# Patient Record
Sex: Male | Born: 1960 | Race: White | Hispanic: No | Smoking: Current every day smoker
Health system: Southern US, Community
[De-identification: ages and names within clinical notes are randomized; demographics above are authoritative.]

## PROBLEM LIST (undated history)

## (undated) DIAGNOSIS — Z789 Other specified health status: Secondary | ICD-10-CM

---
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
## 2020-09-21 ENCOUNTER — Emergency Department (HOSPITAL_COMMUNITY): Payer: Self-pay

## 2020-09-21 ENCOUNTER — Inpatient Hospital Stay (HOSPITAL_COMMUNITY)
Admission: EM | Admit: 2020-09-21 | Discharge: 2020-10-12 | DRG: 515 | Disposition: A | Discharge status: Home / Self Care | Payer: Self-pay | Attending: Internal Medicine | Admitting: Internal Medicine

## 2020-09-21 ENCOUNTER — Encounter (HOSPITAL_COMMUNITY): Payer: Self-pay | Admitting: Emergency Medicine

## 2020-09-21 ENCOUNTER — Other Ambulatory Visit: Payer: Self-pay

## 2020-09-21 DIAGNOSIS — C787 Secondary malignant neoplasm of liver and intrahepatic bile duct: Secondary | ICD-10-CM | POA: Diagnosis present

## 2020-09-21 DIAGNOSIS — Z5902 Unsheltered homelessness: Secondary | ICD-10-CM

## 2020-09-21 DIAGNOSIS — G893 Neoplasm related pain (acute) (chronic): Secondary | ICD-10-CM | POA: Diagnosis present

## 2020-09-21 DIAGNOSIS — E875 Hyperkalemia: Secondary | ICD-10-CM | POA: Diagnosis not present

## 2020-09-21 DIAGNOSIS — D62 Acute posthemorrhagic anemia: Secondary | ICD-10-CM | POA: Diagnosis not present

## 2020-09-21 DIAGNOSIS — C786 Secondary malignant neoplasm of retroperitoneum and peritoneum: Secondary | ICD-10-CM | POA: Diagnosis present

## 2020-09-21 DIAGNOSIS — I639 Cerebral infarction, unspecified: Secondary | ICD-10-CM | POA: Diagnosis present

## 2020-09-21 DIAGNOSIS — I700 Atherosclerosis of aorta: Secondary | ICD-10-CM | POA: Diagnosis present

## 2020-09-21 DIAGNOSIS — C772 Secondary and unspecified malignant neoplasm of intra-abdominal lymph nodes: Secondary | ICD-10-CM | POA: Diagnosis present

## 2020-09-21 DIAGNOSIS — Z7189 Other specified counseling: Secondary | ICD-10-CM

## 2020-09-21 DIAGNOSIS — G589 Mononeuropathy, unspecified: Secondary | ICD-10-CM | POA: Diagnosis present

## 2020-09-21 DIAGNOSIS — Z66 Do not resuscitate: Secondary | ICD-10-CM | POA: Diagnosis not present

## 2020-09-21 DIAGNOSIS — C7989 Secondary malignant neoplasm of other specified sites: Secondary | ICD-10-CM | POA: Diagnosis present

## 2020-09-21 DIAGNOSIS — N1831 Chronic kidney disease, stage 3a: Secondary | ICD-10-CM | POA: Diagnosis not present

## 2020-09-21 DIAGNOSIS — Z5320 Procedure and treatment not carried out because of patient's decision for unspecified reasons: Secondary | ICD-10-CM | POA: Diagnosis not present

## 2020-09-21 DIAGNOSIS — Z515 Encounter for palliative care: Secondary | ICD-10-CM

## 2020-09-21 DIAGNOSIS — I129 Hypertensive chronic kidney disease with stage 1 through stage 4 chronic kidney disease, or unspecified chronic kidney disease: Secondary | ICD-10-CM | POA: Diagnosis not present

## 2020-09-21 DIAGNOSIS — J432 Centrilobular emphysema: Secondary | ICD-10-CM | POA: Diagnosis present

## 2020-09-21 DIAGNOSIS — M8448XA Pathological fracture, other site, initial encounter for fracture: Secondary | ICD-10-CM | POA: Diagnosis present

## 2020-09-21 DIAGNOSIS — R339 Retention of urine, unspecified: Secondary | ICD-10-CM | POA: Diagnosis not present

## 2020-09-21 DIAGNOSIS — K5900 Constipation, unspecified: Secondary | ICD-10-CM | POA: Diagnosis present

## 2020-09-21 DIAGNOSIS — N289 Disorder of kidney and ureter, unspecified: Secondary | ICD-10-CM

## 2020-09-21 DIAGNOSIS — R29898 Other symptoms and signs involving the musculoskeletal system: Secondary | ICD-10-CM

## 2020-09-21 DIAGNOSIS — N179 Acute kidney failure, unspecified: Secondary | ICD-10-CM | POA: Diagnosis not present

## 2020-09-21 DIAGNOSIS — F17210 Nicotine dependence, cigarettes, uncomplicated: Secondary | ICD-10-CM | POA: Diagnosis present

## 2020-09-21 DIAGNOSIS — R269 Unspecified abnormalities of gait and mobility: Secondary | ICD-10-CM | POA: Diagnosis present

## 2020-09-21 DIAGNOSIS — C3411 Malignant neoplasm of upper lobe, right bronchus or lung: Secondary | ICD-10-CM | POA: Diagnosis present

## 2020-09-21 DIAGNOSIS — E871 Hypo-osmolality and hyponatremia: Secondary | ICD-10-CM | POA: Diagnosis not present

## 2020-09-21 DIAGNOSIS — Z833 Family history of diabetes mellitus: Secondary | ICD-10-CM

## 2020-09-21 DIAGNOSIS — K921 Melena: Secondary | ICD-10-CM | POA: Diagnosis not present

## 2020-09-21 DIAGNOSIS — C7951 Secondary malignant neoplasm of bone: Principal | ICD-10-CM | POA: Diagnosis present

## 2020-09-21 DIAGNOSIS — C3490 Malignant neoplasm of unspecified part of unspecified bronchus or lung: Secondary | ICD-10-CM

## 2020-09-21 DIAGNOSIS — F17200 Nicotine dependence, unspecified, uncomplicated: Secondary | ICD-10-CM | POA: Diagnosis present

## 2020-09-21 DIAGNOSIS — R451 Restlessness and agitation: Secondary | ICD-10-CM | POA: Diagnosis not present

## 2020-09-21 DIAGNOSIS — D500 Iron deficiency anemia secondary to blood loss (chronic): Secondary | ICD-10-CM

## 2020-09-21 DIAGNOSIS — C799 Secondary malignant neoplasm of unspecified site: Secondary | ICD-10-CM | POA: Diagnosis present

## 2020-09-21 DIAGNOSIS — K769 Liver disease, unspecified: Secondary | ICD-10-CM | POA: Diagnosis present

## 2020-09-21 DIAGNOSIS — Z20822 Contact with and (suspected) exposure to covid-19: Secondary | ICD-10-CM | POA: Diagnosis present

## 2020-09-21 DIAGNOSIS — D72829 Elevated white blood cell count, unspecified: Secondary | ICD-10-CM | POA: Diagnosis not present

## 2020-09-21 DIAGNOSIS — R590 Localized enlarged lymph nodes: Secondary | ICD-10-CM | POA: Diagnosis present

## 2020-09-21 DIAGNOSIS — R918 Other nonspecific abnormal finding of lung field: Secondary | ICD-10-CM

## 2020-09-21 DIAGNOSIS — Z811 Family history of alcohol abuse and dependence: Secondary | ICD-10-CM

## 2020-09-21 DIAGNOSIS — T380X5A Adverse effect of glucocorticoids and synthetic analogues, initial encounter: Secondary | ICD-10-CM | POA: Diagnosis not present

## 2020-09-21 DIAGNOSIS — C7800 Secondary malignant neoplasm of unspecified lung: Secondary | ICD-10-CM | POA: Diagnosis present

## 2020-09-21 DIAGNOSIS — E860 Dehydration: Secondary | ICD-10-CM | POA: Diagnosis present

## 2020-09-21 DIAGNOSIS — C7931 Secondary malignant neoplasm of brain: Secondary | ICD-10-CM | POA: Diagnosis present

## 2020-09-21 DIAGNOSIS — Z923 Personal history of irradiation: Secondary | ICD-10-CM

## 2020-09-21 DIAGNOSIS — S22069A Unspecified fracture of T7-T8 vertebra, initial encounter for closed fracture: Secondary | ICD-10-CM

## 2020-09-21 DIAGNOSIS — I959 Hypotension, unspecified: Secondary | ICD-10-CM | POA: Diagnosis not present

## 2020-09-21 DIAGNOSIS — R609 Edema, unspecified: Secondary | ICD-10-CM | POA: Diagnosis not present

## 2020-09-21 HISTORY — DX: Other specified health status: Z78.9

## 2020-09-21 LAB — CBC WITH DIFFERENTIAL/PLATELET
Abs Immature Granulocytes: 0.05 10*3/uL (ref 0.00–0.07)
Basophils Absolute: 0.1 10*3/uL (ref 0.0–0.1)
Basophils Relative: 1 %
Eosinophils Absolute: 0.2 10*3/uL (ref 0.0–0.5)
Eosinophils Relative: 2 %
HCT: 47.1 % (ref 39.0–52.0)
Hemoglobin: 15.7 g/dL (ref 13.0–17.0)
Immature Granulocytes: 0 %
Lymphocytes Relative: 34 %
Lymphs Abs: 4.1 10*3/uL — ABNORMAL HIGH (ref 0.7–4.0)
MCH: 32.1 pg (ref 26.0–34.0)
MCHC: 33.3 g/dL (ref 30.0–36.0)
MCV: 96.3 fL (ref 80.0–100.0)
Monocytes Absolute: 1.1 10*3/uL — ABNORMAL HIGH (ref 0.1–1.0)
Monocytes Relative: 9 %
Neutro Abs: 6.5 10*3/uL (ref 1.7–7.7)
Neutrophils Relative %: 54 %
Platelets: 288 10*3/uL (ref 150–400)
RBC: 4.89 MIL/uL (ref 4.22–5.81)
RDW: 12.6 % (ref 11.5–15.5)
WBC: 12 10*3/uL — ABNORMAL HIGH (ref 4.0–10.5)
nRBC: 0 % (ref 0.0–0.2)

## 2020-09-21 LAB — COMPREHENSIVE METABOLIC PANEL
ALT: 22 U/L (ref 0–44)
AST: 29 U/L (ref 15–41)
Albumin: 3.8 g/dL (ref 3.5–5.0)
Alkaline Phosphatase: 105 U/L (ref 38–126)
Anion gap: 11 (ref 5–15)
BUN: 15 mg/dL (ref 6–20)
CO2: 24 mmol/L (ref 22–32)
Calcium: 9.5 mg/dL (ref 8.9–10.3)
Chloride: 101 mmol/L (ref 98–111)
Creatinine, Ser: 1.76 mg/dL — ABNORMAL HIGH (ref 0.61–1.24)
GFR, Estimated: 45 mL/min — ABNORMAL LOW (ref >60)
Glucose, Bld: 98 mg/dL (ref 70–99)
Potassium: 3.7 mmol/L (ref 3.5–5.1)
Sodium: 136 mmol/L (ref 135–145)
Total Bilirubin: 0.9 mg/dL (ref 0.3–1.2)
Total Protein: 7.9 g/dL (ref 6.5–8.1)

## 2020-09-21 MED ORDER — GADOBUTROL 1 MMOL/ML IV SOLN
9.0000 mL | Freq: Once | INTRAVENOUS | Status: AC | PRN
  Administered 2020-09-21: 9 mL via INTRAVENOUS

## 2020-09-21 MED ORDER — HYDROMORPHONE HCL 1 MG/ML IJ SOLN
1.0000 mg | Freq: Once | INTRAMUSCULAR | Status: AC
  Administered 2020-09-21: 1 mg via INTRAVENOUS
  Filled 2020-09-21: qty 1

## 2020-09-21 MED ORDER — ONDANSETRON HCL 4 MG/2ML IJ SOLN
4.0000 mg | Freq: Once | INTRAMUSCULAR | Status: AC
  Administered 2020-09-21: 4 mg via INTRAVENOUS
  Filled 2020-09-21 (×2): qty 2

## 2020-09-21 MED ORDER — MORPHINE SULFATE (PF) 4 MG/ML IV SOLN
8.0000 mg | Freq: Once | INTRAVENOUS | Status: AC
  Administered 2020-09-21: 8 mg via INTRAVENOUS
  Filled 2020-09-21 (×2): qty 2

## 2020-09-21 NOTE — ED Notes (Signed)
Pt taken to MRI  

## 2020-09-21 NOTE — Progress Notes (Signed)
Patient in too much pain to proceed with MRI. Rolling around on table, will not hold still, coughing. Unable to reach RN - sending back to ED.

## 2020-09-21 NOTE — ED Provider Notes (Addendum)
Tyler Barker is a 59 y.o. male, presenting to the ED with difficulty walking over the last several days. He also complains of severe back pain. Denies changes in bowel or bladder function, saddle anesthesias, seizures. Denies IV drug use, use of alcohol. He is not Covid vaccinated.  HPI from Tyler Etienne, DO: "59 yo M with a chief complaint of feeling like his legs are like Jell-O.  This been going on for about 4 or 5 days.  Patient is currently staying in a hotel while his truck gets fixed.  He states he has had problems with his back in the past and so thought maybe it was his back.  He went to a chiropractor yesterday and had some plain films performed.  He is worried because he is continuing to feel unsteady when he tries to walk to the bathroom.  He denies any significant worsening to his back pain.  Feels that its up to his chest and some radiation to the ribs.  Denies rash.  Denies recent illness.  Denies recent immunization.  Denies numbness to the legs.  Denies loss of bowel or bladder denies loss of peritoneal sensation.  Denies fever.  Denies abdominal pain.  The history is provided by the patient.  Illness Severity:  Moderate Onset quality:  Gradual Duration:  5 days Timing:  Constant Progression:  Worsening Chronicity:  New Associated symptoms: no abdominal pain, no chest pain, no congestion, no diarrhea, no fever, no headaches, no myalgias, no rash, no shortness of breath and no vomiting"  History reviewed. No pertinent past medical history.  Physical Exam  BP 116/74   Pulse 74   Temp 97.9 F (36.6 C) (Oral)   Resp (!) 23   Ht 5\' 10"  (1.778 m)   Wt 90.7 kg   SpO2 96%   BMI 28.70 kg/m   Physical Exam Vitals and nursing note reviewed.  Constitutional:      General: He is not in acute distress.    Appearance: He is well-developed. He is not diaphoretic.  HENT:     Head: Normocephalic and atraumatic.     Mouth/Throat:     Mouth: Mucous membranes are moist.     Pharynx:  Oropharynx is clear.  Eyes:     Conjunctiva/sclera: Conjunctivae normal.  Cardiovascular:     Rate and Rhythm: Normal rate and regular rhythm.     Pulses: Normal pulses.          Radial pulses are 2+ on the right side and 2+ on the left side.       Posterior tibial pulses are 2+ on the right side and 2+ on the left side.  Pulmonary:     Effort: Pulmonary effort is normal. No respiratory distress.     Breath sounds: Normal breath sounds.  Musculoskeletal:     Cervical back: Neck supple.  Skin:    General: Skin is warm and dry.     Coloration: Skin is not pale.  Neurological:     Mental Status: He is alert.     Comments: No noted cognitive deficits. No noted facial droop. Handles oral secretions without noted difficulty. Moves each of his extremities.  Psychiatric:        Behavior: Behavior normal.     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)   Critical care was time spent personally by me on the following activities:  Ordering and performing treatments and interventions, ordering and review of laboratory studies, ordering and review of radiographic studies, re-evaluation of patient's condition, obtaining history from patient or surrogate, evaluation of patient's response to treatment, development of treatment plan with patient or surrogate, discussions with consultants, review of old charts and examination of patient   I assumed direction of critical care for this patient from another provider in my specialty: no       Abnormal  Labs Reviewed  CBC WITH DIFFERENTIAL/PLATELET - Abnormal; Notable for the following components:      Result Value   WBC 12.0 (*)    Lymphs Abs 4.1 (*)    Monocytes Absolute 1.1 (*)    All other components within normal limits  COMPREHENSIVE METABOLIC PANEL - Abnormal; Notable for the following components:   Creatinine, Ser 1.76 (*)    GFR, Estimated 45 (*)    All other components within normal limits    CT Head Wo Contrast  Result Date: 09/21/2020 CLINICAL DATA:  Dizziness and leg weakness for the past few days. EXAM: CT HEAD WITHOUT CONTRAST TECHNIQUE: Contiguous axial images were obtained from the base of the skull through the vertex without intravenous contrast. COMPARISON:  None. FINDINGS: Brain: Cortical and subcortical hypodensity with loss of the normal gray-white matter differentiation in the posterior right parietal lobe, concerning for acute infarct. Small old lacunar infarct in the left cerebellum. No evidence of hemorrhage, hydrocephalus, extra-axial collection or mass lesion/mass effect. Vascular: No hyperdense vessel or unexpected calcification. Skull: Normal. Negative for fracture or focal lesion. Sinuses/Orbits: No acute finding. Other: None. IMPRESSION: 1. Findings concerning for acute infarct in the posterior right parietal lobe. No hemorrhage. 2. Small old lacunar infarct in the left cerebellum. Electronically Signed   By: Titus Dubin M.D.   On: 09/21/2020 17:14   MR BRAIN W WO CONTRAST  Result Date: 09/22/2020 EXAM: MRI HEAD WITHOUT AND WITH CONTRAST TECHNIQUE: Multiplanar, multiecho pulse sequences of the brain and surrounding structures were obtained without and with intravenous contrast. CONTRAST:  60mL GADAVIST GADOBUTROL 1 MMOL/ML IV SOLN COMPARISON:  Prior CT from earlier the same day. FINDINGS: Brain: Multiple scattered enhancing lesions are seen throughout the brain, consistent with intracranial metastatic disease. These are seen as follows: - dominant and most  prominent of these lesions is positioned at the parasagittal right parietal lobe in measures 2.0 x 1.5 x 1.8 cm (series 23, image 35). Associated susceptibility artifact compatible with hemorrhage and/or necrosis. Localized vasogenic edema without significant regional mass effect or midline shift. Several additional subcentimeter lesions are seen as follows: - 4 mm lesion at the subcortical anterior left frontal lobe (series 23, image 37) - punctate lesion at the deep white matter of the right frontal lobe (series 24, image 23). - additional possible 4 mm lesion at the subcortical anterior right frontal lobe (series 24, image 25). - 6 mm rim enhancing lesion at the mid left temporal lobe (series 25, image 21). - 4 mm lesion at the right occipital lobe (series 23, image 23). Approximate 6 lesions are seen in total. Underlying cerebral volume within normal limits. No significant cerebral white matter disease. Small remote lacunar infarcts noted at  the thalami bilaterally. Additional small focus of encephalomalacia without associated enhancement at the peripheral left cerebellum consistent with a chronic ischemic infarct as well (series 15, image 7). 4 mm focus of restricted diffusion at the subcortical right occipital lobe consistent with a small acute ischemic infarct (series 9, image 33). No associated hemorrhage or mass effect. Additional subtle diffusion abnormality involving the cortical gray matter of the high left frontal lobe also consistent with an acute ischemic infarct (series 9, images 91, 89). w additional subtle 7 mm focus of diffusion abnormality involving the right occipital pole without associated enhancement also suspicious for a small acute ischemic infarct (series 9, image 67). No other evidence for acute or subacute ischemia. Gray-white matter differentiation otherwise maintained. No midline shift or hydrocephalus. No extra-axial fluid collection. Pituitary gland suprasellar region within normal  limits. Midline structures intact. Vascular: Major intracranial vascular flow voids are maintained. Skull and upper cervical spine: Craniocervical junction within normal limits. Metastatic lesion involving the left occipital condyle noted (series 13, image 15). No other definite osseous lesions about the calvarium. Scalp soft tissues within normal limits. Sinuses/Orbits: Globes and orbital soft tissues within normal limits. Mild scattered mucosal thickening noted within the ethmoidal air cells. Paranasal sinuses are otherwise largely clear. No significant mastoid effusion. Inner ear structures within normal limits. Other: None. IMPRESSION: 1. Multiple scattered enhancing lesions throughout the brain, consistent with intracranial metastatic disease. The dominant lesion is positioned at the right parietal lobe and measures up to 2 cm. Localized vasogenic edema without significant regional mass effect about this lesion. Remainder of the lesions are subcentimeter in size. 2. Few scattered subcentimeter acute ischemic nonhemorrhagic infarcts involving the left frontal lobe, right periatrial white matter, and right occipital lobe as above. 3. Osseous metastatic lesion involving the left occipital condyle. 4. 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 Findings communicated by telephone at the time of interpretation on 09/21/2020 at 11:55 p.m. to provider Arlean Hopping, who verbally acknowledged these results. Electronically Signed   By: Jeannine Boga M.D.   On: 09/22/2020 00:24   MR CERVICAL SPINE W WO CONTRAST  Result Date: 09/22/2020 CLINICAL DATA:  Initial evaluation for lower extremity weakness. EXAM: MRI CERVICAL SPINE WITHOUT AND WITH CONTRAST TECHNIQUE: Multiplanar and multiecho pulse sequences of the cervical spine, to include the craniocervical junction and cervicothoracic junction, were obtained without and with intravenous contrast. CONTRAST:  79mL GADAVIST  GADOBUTROL 1 MMOL/ML IV SOLN COMPARISON:  None available. FINDINGS: Alignment: Straightening of the normal cervical lordosis. Trace anterolisthesis of C7 on T1, chronic and facet mediated. Vertebrae: Osseous metastatic implant seen involving the left occipital condyle. Additional osseous metastasis involves the posterior aspect of the C5 vertebral body. Metastatic lesion largely replaces the C6 vertebral body. Additional metastases seen involving the right posterior elements of C7 and T1. Additional signal abnormality involving the anterior aspect of C4 could reflect an additional lesion versus reactive degenerative change. No associated pathologic fracture. No significant extra osseous extension of tumor in the cervical spine. Cord: Normal signal and morphology. No intramedullary or epidural tumor. No abnormal enhancement. Posterior Fossa, vertebral arteries, paraspinal tissues: 9 mm metastatic soft tissue implant seen involving the subcutaneous fat along the midline of the posterior cervical spine (series 36, image 5). Additional 1.5 cm soft tissue implant involves the right posterior paraspinous musculature at the level of C7 (series 38, image 33). Smaller 1 cm soft tissue implant involving the right posterior paraspinous musculature at C5-6 (series 38, image 25). Abnormal  right supraclavicular adenopathy consistent with nodal metastatic disease, with the dominant node measuring 2.9 x 1.8 cm (series 38, image 33). Disc levels: C2-C3: Shallow central disc protrusion minimally indents the ventral thecal sac. Mild left-sided facet hypertrophy. No spinal stenosis. Foramina remain patent. C3-C4: Central disc osteophyte complex indents the ventral thecal sac, partially effacing the ventral CSF. Moderate left-sided facet hypertrophy with bilateral uncovertebral spurring. Resultant mild spinal stenosis without significant cord deformity. Severe left with moderate right C4 foraminal narrowing. C4-C5: Small central disc  protrusion indents the ventral thecal sac. No significant stenosis or cord deformity. Superimposed mild left sided facet hypertrophy with uncovertebral spurring. Resultant moderate left worse than right C5 foraminal stenosis. C5-C6: Disc desiccation without disc bulge. Mild uncovertebral hypertrophy on the right. No spinal stenosis. Foramina remain patent. C6-C7: Mild disc bulge with right greater than left uncovertebral spurring. No spinal stenosis. Severe right with moderate left C7 foraminal narrowing. C7-T1: Trace anterolisthesis. Minimal disc bulge with bilateral uncovertebral hypertrophy. Moderate left with mild right facet degeneration. No significant spinal stenosis. Moderate right C8 foraminal stenosis. No significant left foraminal encroachment. IMPRESSION: 1. 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 No associated pathologic fracture or extraosseous extension. 2. 9 mm metastatic soft tissue implant involving the subcutaneous fat along the midline of the posterior cervical spine. Additional 1 cm in 1.5 cm soft tissue implants involving the right posterior paraspinous musculature at the levels of C5-6 and C7. 3. Abnormal right supraclavicular adenopathy, consistent with nodal metastatic disease. 4. Multilevel cervical spondylosis with resultant mild spinal stenosis at C3-4. Moderate to severe bilateral C4, C5, C7, and right C8 foraminal stenosis as above. Findings communicated by telephone at the time of interpretation on 09/21/2020 at 11:55 p.m. to provider Arlean Hopping, who verbally acknowledged these results. Electronically Signed   By: Jeannine Boga M.D.   On: 09/22/2020 00:42    MDM    Clinical Course as of Sep 22 46  Sat Sep 21, 2020  2200 MRI tech states they noted lesions on the patient's spine. Radiologist recommends performing  the MRIs with and without contrast. They also request addition of MRI cervical spine.   [SJ]  2352 Radiologist called to inform me of the patient's metastatic disease noted on MRIs. States addition of CTs of the chest, abdomen, and pelvis would be useful for further assessment of metastases.   [SJ]  2358 Spoke with Dr. Cheral Marker, neurologist. States he will come assess and consult on the patient.   [SJ]  Sun Sep 22, 2020  0006 I discussed the findings on the MRIs with the patient. I allowed opportunity for questions. I offered to call family or friends for support, patient declined. I also offered to call the chaplain, patient declined.   [SJ]  0030 Sent an inbox message to Dr. Lindi Adie, on call for medical oncology, requesting consult on the patient during his admission.   [SJ]  [id number] Spoke with Dr. Myna Hidalgo, hospitalist. Agrees to admit the patient.    [SJ]    Clinical Course User Index [SJ] Lorayne Bender, PA-C    Patient care handoff report received from Tyler Etienne, DO. Plan: Follow-up on MRIs. Consult with neuro following results.  Patient presents with difficulty using and coordinating movement of his legs for the last several days. Patient is nontoxic appearing, afebrile, not tachycardic, not tachypneic, not hypotensive, maintains excellent  SPO2 on room air.  I have reviewed the patient's chart to obtain more information.   I reviewed and interpreted the patient's labs and radiological studies. Metastatic disease noted on MRIs of the spine and brain.  Patient admitted due to new diagnosis of extensive metastatic disease as well as difficulty with ambulation. Formal reads of MRI thoracic spine, lumbar spine, and sacrum pending at time of admission.  CT chest, abdomen, and pelvis also pending at time of admission.   Vitals:   09/21/20 2015 09/21/20 2030 09/21/20 2045 09/21/20 2359  BP: (!) 110/36 (!) 118/48 116/74 124/73  Pulse: 79 75 74 88  Resp: (!) 21 (!) 23 (!) 23 18  Temp:    98 F  (36.7 C)  TempSrc:    Oral  SpO2: 99% 94% 96% 96%  Weight:      Height:          Lorayne Bender, PA-C 09/22/20 0048    Lorayne Bender, PA-C 09/22/20 [place], [place], DO 09/22/20 1103

## 2020-09-21 NOTE — ED Provider Notes (Signed)
[facility] EMERGENCY DEPARTMENT Provider Note   CSN: [id number] Arrival date & time: 09/21/20  1326     History Chief Complaint  Patient presents with  . Gait Problem    Frances Joynt is a 59 y.o. male.  59 yo M with a chief complaint of feeling like his legs are like Jell-O.  This been going on for about 4 or 5 days.  Patient is currently staying in a hotel while his truck gets fixed.  He states he has had problems with his back in the past and so thought maybe it was his back.  He went to a chiropractor yesterday and had some plain films performed.  He is worried because he is continuing to feel unsteady when he tries to walk to the bathroom.  He denies any significant worsening to his back pain.  Feels that its up to his chest and some radiation to the ribs.  Denies rash.  Denies recent illness.  Denies recent immunization.  Denies numbness to the legs.  Denies loss of bowel or bladder denies loss of peritoneal sensation.  Denies fever.  Denies abdominal pain.  The history is provided by the patient.  Illness Severity:  Moderate Onset quality:  Gradual Duration:  5 days Timing:  Constant Progression:  Worsening Chronicity:  New Associated symptoms: no abdominal pain, no chest pain, no congestion, no diarrhea, no fever, no headaches, no myalgias, no rash, no shortness of breath and no vomiting        History reviewed. No pertinent past medical history.  There are no problems to display for this patient.   History reviewed. No pertinent surgical history.     No family history on file.  Social History   Tobacco Use  . Smoking status: Current Every Day Smoker  . Smokeless tobacco: Never Used  Substance Use Topics  . Alcohol use: Not Currently  . Drug use: Not Currently    Home Medications Prior to Admission medications   Not on File    Allergies    Patient has no allergy information on record.  Review of Systems   Review of Systems    Constitutional: Negative for chills and fever.  HENT: Negative for congestion and facial swelling.   Eyes: Negative for discharge and visual disturbance.  Respiratory: Negative for shortness of breath.   Cardiovascular: Negative for chest pain and palpitations.  Gastrointestinal: Negative for abdominal pain, diarrhea and vomiting.  Musculoskeletal: Positive for gait problem. Negative for arthralgias and myalgias.  Skin: Negative for color change and rash.  Neurological: Negative for tremors, syncope and headaches.  Psychiatric/Behavioral: Negative for confusion and dysphoric mood.    Physical Exam Updated Vital Signs BP 116/74   Pulse 74   Temp 97.9 F (36.6 C) (Oral)   Resp (!) 23   Ht 5\' 10"  (1.778 m)   Wt 90.7 kg   SpO2 96%   BMI 28.70 kg/m   Physical Exam Vitals and nursing note reviewed.  Constitutional:      Appearance: He is well-developed.  HENT:     Head: Normocephalic and atraumatic.  Eyes:     Pupils: Pupils are equal, round, and reactive to light.  Neck:     Vascular: No JVD.  Cardiovascular:     Rate and Rhythm: Normal rate and regular rhythm.     Heart sounds: No murmur heard.  No friction rub. No gallop.   Pulmonary:     Effort: No respiratory distress.  Breath sounds: No wheezing.  Abdominal:     General: There is no distension.     Tenderness: There is no abdominal tenderness. There is no guarding or rebound.  Musculoskeletal:        General: Normal range of motion.     Cervical back: Normal range of motion and neck supple.  Skin:    Coloration: Skin is not pale.     Findings: No rash.  Neurological:     Mental Status: He is alert and oriented to person, place, and time.     Comments: Cranial nerves II through XII intact.  No obvious weakness to bilateral lower extremities.  Pulse motor and sensation intact to bilateral lower extremities.  Reflexes are 2+ and equal.  There is no clonus.  Negative Babinski bilaterally.  Finger-to-nose without  issue, heel-to-shin is significantly impaired and has the wavering with the right lower extremity.  Psychiatric:        Behavior: Behavior normal.     ED Results / Procedures / Treatments   Labs (all labs ordered are listed, but only abnormal results are displayed) Labs Reviewed  CBC WITH DIFFERENTIAL/PLATELET - Abnormal; Notable for the following components:      Result Value   WBC 12.0 (*)    Lymphs Abs 4.1 (*)    Monocytes Absolute 1.1 (*)    All other components within normal limits  COMPREHENSIVE METABOLIC PANEL - Abnormal; Notable for the following components:   Creatinine, Ser 1.76 (*)    GFR, Estimated 45 (*)    All other components within normal limits  RESPIRATORY PANEL BY RT PCR (FLU A&B, COVID)    EKG None  Radiology CT Head Wo Contrast  Result Date: 09/21/2020 CLINICAL DATA:  Dizziness and leg weakness for the past few days. EXAM: CT HEAD WITHOUT CONTRAST TECHNIQUE: Contiguous axial images were obtained from the base of the skull through the vertex without intravenous contrast. COMPARISON:  None. FINDINGS: Brain: Cortical and subcortical hypodensity with loss of the normal gray-white matter differentiation in the posterior right parietal lobe, concerning for acute infarct. Small old lacunar infarct in the left cerebellum. No evidence of hemorrhage, hydrocephalus, extra-axial collection or mass lesion/mass effect. Vascular: No hyperdense vessel or unexpected calcification. Skull: Normal. Negative for fracture or focal lesion. Sinuses/Orbits: No acute finding. Other: None. IMPRESSION: 1. Findings concerning for acute infarct in the posterior right parietal lobe. No hemorrhage. 2. Small old lacunar infarct in the left cerebellum. Electronically Signed   By: Titus Dubin M.D.   On: 09/21/2020 17:14    Procedures Procedures (including critical care time)  Medications Ordered in ED Medications  morphine 4 MG/ML injection 8 mg (8 mg Intravenous Given 09/21/20 2005)    ondansetron (ZOFRAN) injection 4 mg (4 mg Intravenous Given 09/21/20 2005)    ED Course  I have reviewed the triage vital signs and the nursing notes.  Pertinent labs & imaging results that were available during my care of the patient were reviewed by me and considered in my medical decision making (see chart for details).    MDM Rules/Calculators/A&P                          58 yo M with a chief complaints of feeling like his legs like Jell-O.  By history is difficult to differentiate whether the patient is describing more of a Guillain-Barr-like syndrome or more of a syndrome of spinal cord compression or of a stroke.  Seems more likely the latter on exam.  No obvious weakness but has some wavering heel-to-shin especially with his right lower extremity.  His back pain is more thoracic and has not really changed recently no recent trauma.  No obvious weakness or reflex changes making Guillain-Barr less likely.  Will discuss with neurology.  CT of the head laboratory evaluation.  I discussed the case with neurology.  Recommended an MRI of the brain with the CT head read of possible acute stroke.  She felt based on my description of the patient's exam and history that this is more likely to be spinal in etiology.  Will obtain an MRI of the T and L-spine.  Should these MRIs be negative I would likely discussed the case again with the neurologist on call and obtain their opinion.  May asked them to evaluate the patient and perform a neuro exam.  The patients results and plan were reviewed and discussed.   Any x-rays performed were independently reviewed by myself.   Differential diagnosis were considered with the presenting HPI.  Medications  morphine 4 MG/ML injection 8 mg (8 mg Intravenous Given 09/21/20 2005)  ondansetron (ZOFRAN) injection 4 mg (4 mg Intravenous Given 09/21/20 2005)    Vitals:   09/21/20 2011 09/21/20 2015 09/21/20 2030 09/21/20 2045  BP:  (!) 110/36 (!) 118/48 116/74   Pulse: 90 79 75 74  Resp: 17 (!) 21 (!) 23 (!) 23  Temp:      TempSrc:      SpO2: 100% 99% 94% 96%  Weight:      Height:        Final diagnoses:  Leg weakness, bilateral      Final Clinical Impression(s) / ED Diagnoses Final diagnoses:  Leg weakness, bilateral    Rx / DC Orders ED Discharge Orders    None       [name], DO 09/21/20 2104

## 2020-09-21 NOTE — ED Triage Notes (Signed)
Pt. Stated, I went to a chiropractor on Wednesday because of my legs are [email or web address] [id number]. He said I had a pinched nerve. I having pain in [email or web address] middle of my back.

## 2020-09-22 ENCOUNTER — Observation Stay (HOSPITAL_COMMUNITY): Payer: Self-pay

## 2020-09-22 ENCOUNTER — Encounter (HOSPITAL_COMMUNITY): Payer: Self-pay | Admitting: Family Medicine

## 2020-09-22 ENCOUNTER — Emergency Department (HOSPITAL_COMMUNITY): Payer: Self-pay

## 2020-09-22 ENCOUNTER — Observation Stay (HOSPITAL_BASED_OUTPATIENT_CLINIC_OR_DEPARTMENT_OTHER): Payer: Self-pay

## 2020-09-22 DIAGNOSIS — I6389 Other cerebral infarction: Secondary | ICD-10-CM

## 2020-09-22 DIAGNOSIS — F17200 Nicotine dependence, unspecified, uncomplicated: Secondary | ICD-10-CM

## 2020-09-22 DIAGNOSIS — C799 Secondary malignant neoplasm of unspecified site: Secondary | ICD-10-CM

## 2020-09-22 DIAGNOSIS — I639 Cerebral infarction, unspecified: Secondary | ICD-10-CM | POA: Diagnosis present

## 2020-09-22 DIAGNOSIS — R918 Other nonspecific abnormal finding of lung field: Secondary | ICD-10-CM

## 2020-09-22 DIAGNOSIS — K769 Liver disease, unspecified: Secondary | ICD-10-CM | POA: Diagnosis present

## 2020-09-22 DIAGNOSIS — N289 Disorder of kidney and ureter, unspecified: Secondary | ICD-10-CM

## 2020-09-22 LAB — URINALYSIS, COMPLETE (UACMP) WITH MICROSCOPIC
Bacteria, UA: NONE SEEN
Bilirubin Urine: NEGATIVE
Glucose, UA: NEGATIVE mg/dL
Ketones, ur: 5 mg/dL — AB
Leukocytes,Ua: NEGATIVE
Nitrite: NEGATIVE
Protein, ur: NEGATIVE mg/dL
Specific Gravity, Urine: 1.03 (ref 1.005–1.030)
pH: 5 (ref 5.0–8.0)

## 2020-09-22 LAB — CBC
HCT: 45 % (ref 39.0–52.0)
Hemoglobin: 15 g/dL (ref 13.0–17.0)
MCH: 32.4 pg (ref 26.0–34.0)
MCHC: 33.3 g/dL (ref 30.0–36.0)
MCV: 97.2 fL (ref 80.0–100.0)
Platelets: 298 10*3/uL (ref 150–400)
RBC: 4.63 MIL/uL (ref 4.22–5.81)
RDW: 12.8 % (ref 11.5–15.5)
WBC: 12.7 10*3/uL — ABNORMAL HIGH (ref 4.0–10.5)
nRBC: 0 % (ref 0.0–0.2)

## 2020-09-22 LAB — LIPID PANEL
Cholesterol: 169 mg/dL (ref 0–200)
HDL: 27 mg/dL — ABNORMAL LOW (ref >40)
LDL Cholesterol: 102 mg/dL — ABNORMAL HIGH (ref 0–99)
Total CHOL/HDL Ratio: 6.3 RATIO
Triglycerides: 201 mg/dL — ABNORMAL HIGH (ref <150)
VLDL: 40 mg/dL (ref 0–40)

## 2020-09-22 LAB — ECHOCARDIOGRAM COMPLETE
AR max vel: 3.7 cm2
AV Area VTI: 4.15 cm2
AV Area mean vel: 3.44 cm2
AV Mean grad: 3.4 mmHg
AV Peak grad: 6.9 mmHg
Ao pk vel: 1.32 m/s
Area-P 1/2: 3.46 cm2
Height: 70 in
S' Lateral: 2.4 cm
Weight: 3200 oz

## 2020-09-22 LAB — BASIC METABOLIC PANEL
Anion gap: 12 (ref 5–15)
BUN: 19 mg/dL (ref 6–20)
CO2: 24 mmol/L (ref 22–32)
Calcium: 9.3 mg/dL (ref 8.9–10.3)
Chloride: 99 mmol/L (ref 98–111)
Creatinine, Ser: 1.79 mg/dL — ABNORMAL HIGH (ref 0.61–1.24)
GFR, Estimated: 44 mL/min — ABNORMAL LOW (ref >60)
Glucose, Bld: 91 mg/dL (ref 70–99)
Potassium: 4.2 mmol/L (ref 3.5–5.1)
Sodium: 135 mmol/L (ref 135–145)

## 2020-09-22 LAB — RESPIRATORY PANEL BY RT PCR (FLU A&B, COVID)
Influenza A by PCR: NEGATIVE
Influenza B by PCR: NEGATIVE
SARS Coronavirus 2 by RT PCR: NEGATIVE

## 2020-09-22 LAB — SODIUM, URINE, RANDOM: Sodium, Ur: 25 mmol/L

## 2020-09-22 LAB — HEMOGLOBIN A1C
Hgb A1c MFr Bld: 5.3 % (ref 4.8–5.6)
Mean Plasma Glucose: 105.41 mg/dL

## 2020-09-22 LAB — CREATININE, URINE, RANDOM: Creatinine, Urine: 142.72 mg/dL

## 2020-09-22 LAB — HIV ANTIBODY (ROUTINE TESTING W REFLEX): HIV Screen 4th Generation wRfx: NONREACTIVE

## 2020-09-22 MED ORDER — SENNOSIDES-DOCUSATE SODIUM 8.6-50 MG PO TABS
1.0000 | ORAL_TABLET | Freq: Every evening | ORAL | Status: DC | PRN
  Administered 2020-09-26 (×2): 1 via ORAL
  Filled 2020-09-22 (×2): qty 1

## 2020-09-22 MED ORDER — ACETAMINOPHEN 160 MG/5ML PO SOLN: 650.0000 mg | ORAL | Status: DC | PRN

## 2020-09-22 MED ORDER — STROKE: EARLY STAGES OF RECOVERY BOOK
Freq: Once | Status: AC
  Filled 2020-09-22: qty 1

## 2020-09-22 MED ORDER — ACETAMINOPHEN 650 MG RE SUPP: 650.0000 mg | RECTAL | Status: DC | PRN

## 2020-09-22 MED ORDER — DEXAMETHASONE SODIUM PHOSPHATE 4 MG/ML IJ SOLN
4.0000 mg | Freq: Two times a day (BID) | INTRAMUSCULAR | Status: DC
  Administered 2020-09-22 – 2020-10-11 (×40): 4 mg via INTRAVENOUS
  Filled 2020-09-22 (×40): qty 1

## 2020-09-22 MED ORDER — HYDROMORPHONE HCL 1 MG/ML IJ SOLN
0.5000 mg | INTRAMUSCULAR | Status: DC | PRN
  Administered 2020-09-22 – 2020-09-25 (×14): 1 mg via INTRAVENOUS
  Filled 2020-09-22 (×16): qty 1

## 2020-09-22 MED ORDER — ASPIRIN 300 MG RE SUPP
300.0000 mg | Freq: Every day | RECTAL | Status: DC
  Filled 2020-09-22 (×2): qty 1

## 2020-09-22 MED ORDER — ASPIRIN 325 MG PO TABS
325.0000 mg | ORAL_TABLET | Freq: Every day | ORAL | Status: DC
  Administered 2020-09-22 – 2020-09-27 (×6): 325 mg via ORAL
  Filled 2020-09-22 (×6): qty 1

## 2020-09-22 MED ORDER — IOHEXOL 9 MG/ML PO SOLN
ORAL | Status: AC
  Administered 2020-09-22: 500 mL via ORAL
  Filled 2020-09-22: qty 500

## 2020-09-22 MED ORDER — ONDANSETRON HCL 4 MG/2ML IJ SOLN
4.0000 mg | Freq: Four times a day (QID) | INTRAMUSCULAR | Status: DC | PRN
  Administered 2020-09-22 – 2020-10-12 (×43): 4 mg via INTRAVENOUS
  Filled 2020-09-22 (×45): qty 2

## 2020-09-22 MED ORDER — IOHEXOL 300 MG/ML  SOLN
75.0000 mL | Freq: Once | INTRAMUSCULAR | Status: AC | PRN
  Administered 2020-09-22: 75 mL via INTRAVENOUS

## 2020-09-22 MED ORDER — ACETAMINOPHEN 325 MG PO TABS: 650.0000 mg | ORAL_TABLET | ORAL | Status: DC | PRN

## 2020-09-22 MED ORDER — IOHEXOL 9 MG/ML PO SOLN: 500.0000 mL | ORAL | Status: AC

## 2020-09-22 MED ORDER — SODIUM CHLORIDE 0.9 % IV SOLN: INTRAVENOUS | Status: AC

## 2020-09-22 MED ORDER — ENOXAPARIN SODIUM 40 MG/0.4ML ~~LOC~~ SOLN
40.0000 mg | Freq: Every day | SUBCUTANEOUS | Status: DC
  Administered 2020-09-22 – 2020-09-28 (×6): 40 mg via SUBCUTANEOUS
  Filled 2020-09-22 (×7): qty 0.4

## 2020-09-22 NOTE — Consult Note (Signed)
NEURO HOSPITALIST CONSULT NOTE   Requesting physician: Dr. Myna Hidalgo  Reason for Consult: Metastatic lesions seen on MRI of thoracic spine and brain  History obtained from:  Patient and Chart     HPI:                                                                                                                                          Tyler Barker is an 59 y.o. male who presented to the ED on Saturday afternoon due to legs feeling weak and incoordinated. He had gone to a Chiropractor for this on Wednesday because "my legs are like Jello" for the past 4-5 days. He was told that he had a pinched nerve. The patient has been having pain in the middle of his back as well. The pain radiates up to his chest and ribs. He did not complain of any recent symptoms of illness, has not had a rash and was not recently immunized. He also denied sensory numbness involving his legs, loss of bowel or bladder continence, abdominal pain or saddle anesthesia.   An MRI of the thoracic spine and brain was obtained in the ED, revealing multiple vertebral metastases, a large right parietal lobe metastatic lesion and multiple smaller enhancing brain metastases; a large hilar and mediastinal mass was partially intersected by the imaging planes. Also noted on the brain MRI were a few scattered subcentimeter acute ischemic nonhemorrhagic infarcts involving the left frontal lobe, right periatrial white matter, and right occipital lobe.  Imaging studies were personally reviewed. All of the MRI scans documented below were obtained with and without contrast.   MRI brain:  1. Multiple scattered enhancing lesions throughout the brain, consistent with intracranial metastatic disease. The dominant lesion is positioned at the right parietal lobe and measures up to 2 cm. Localized vasogenic edema without significant regional mass effect about this lesion. Remainder of the lesions are subcentimeter in size. 2. Few  scattered subcentimeter acute ischemic nonhemorrhagic infarcts involving the left frontal lobe, right periatrial white matter, and right occipital lobe as above. 3. Osseous metastatic lesion involving the left occipital condyle. 4. 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  MRI cervical spine: 1. 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 No associated pathologic fracture or extraosseous extension. 2. 9 mm metastatic soft tissue implant involving the subcutaneous fat along the midline of the posterior cervical spine. Additional 1 cm in 1.5 cm soft tissue implants involving the right posterior paraspinous musculature at the levels of C5-6 and C7. 3. Abnormal right supraclavicular adenopathy, consistent with nodal metastatic disease. 4. Multilevel cervical spondylosis with resultant mild  spinal stenosis at C3-4. Moderate to severe bilateral C4, C5, C7, and right C8 foraminal stenosis as above.  MRI thoracic spine: 1. 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 2. Associated pathologic fracture of T7 with up to 50% height loss. Extra osseous extension with epidural tumor at this level results in severe spinal stenosis and compression/impingement of the thoracic spinal cord. No cord signal changes at this time. 3. Additional mild epidural extension of tumor into the ventral epidural space at the level of T9 with no more than mild spinal stenosis. 4. Large right perihilar mass and/or adenopathy, raising the possibility for a primary lung malignancy. Additional extensive mediastinal and supraclavicular adenopathy with widespread pulmonary nodules concerning for metastatic disease. Further evaluation with dedicated cross-sectional imaging of  the chest recommended. 5. Few scattered lesions within the liver, concerning for intrahepatic metastases.  MRI lumbar spine:  1. Scattered osseous metastatic disease throughout the lumbar spine and sacrum as above. Associated pathologic fracture at the level of L3 with up to 25% height loss without bony retropulsion. No epidural or intracanalicular tumor. 2. 4.6 cm metastatic soft tissue implants within the left posterior paraspinous musculature at the level of L4. 3. Right foraminal to extraforaminal disc protrusions at L3-4 and L4-5, contacting and potentially irritating the exiting right L3 and L4 nerve roots respectively. 4. Underlying moderate to advanced multilevel facet hypertrophy as detailed above.  MRI sacrum: 1. 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 Early extraosseous extension into the adjacent right S2 foramen without frank neural impingement. 2. Additional osseous metastases involving the right sacral ala and right iliac wing as above. No pathologic fracture or other complication. 3. Metastatic soft tissue implants within the right gluteal musculature as above.  Past Medical History:  Diagnosis Date  . Medical history non-contributory     Past Surgical History:  Procedure Laterality Date  . APPENDECTOMY  1979    Family History  Problem Relation Age of Onset  . Diabetes Maternal Aunt   . Alcoholism Paternal Grandmother               Social History:  reports that he has been smoking. He has been smoking about 1.00 pack per day. He has never used smokeless tobacco. He reports previous alcohol use. He reports previous drug use.  No Known Allergies  MEDICATIONS:                                                                                                                      No current facility-administered medications on file prior to encounter.   Current Outpatient Medications on File Prior to Encounter   Medication Sig Dispense Refill  . naproxen sodium (ALEVE) 220 MG tablet Take 220 mg by mouth 2 (two) times daily as needed (pain).       ROS:  As per HPI. The patient does not endorse additional complaints except for nausea when drinking Omnipaque in preparation for one of his scans.    Blood pressure 101/71, pulse 76, temperature 98 F (36.7 C), temperature source Oral, resp. rate 14, height 5\' 10"  (1.778 m), weight 90.7 kg, SpO2 94 %.   General Examination:                                                                                                       Physical Exam  HEENT-  /AT   Lungs- Respirations unlabored Extremities- Warm and well perfused  Neurological Examination Mental Status: Alert, fully oriented, thought content appropriate.  Speech fluent without evidence of aphasia.  Able to follow all commands without difficulty. Cranial Nerves: II: Visual fields intact. PERRL.  III,IV, VI: No ptosis. EOMI.  V,VII: Face is symmetric. Temp sensation equal bilaterally  VIII: hearing intact to voice IX,X: No  hypophonia XI: Symmetric XII: Midline tongue extension Motor: BUE 5/5 proximally and distally RLE will not lift antigravity in the context of endorsed decreased effort, but consistently weaker than the left  LLE will briefly lift antigravity and resist examiner with 4/5 strength in knee extension and hip flexion. Patient states decreased effort due to back pain.  Sensory: Temp and light touch intact throughout, bilaterally. No extinction to DSS Deep Tendon Reflexes: 1+ bilateral brachioradialis. 2+ bilateral patellae Cerebellar: No ataxia with FNF bilaterally  Gait: Deferred   Lab Results: Basic Metabolic Panel: Recent Labs  Lab 09/21/20 1605  NA 136  K 3.7  CL 101  CO2 24  GLUCOSE 98  BUN 15  CREATININE 1.76*  CALCIUM 9.5     CBC: Recent Labs  Lab 09/21/20 1605  WBC 12.0*  NEUTROABS 6.5  HGB 15.7  HCT 47.1  MCV 96.3  PLT 288    Cardiac Enzymes: No results for input(s): CKTOTAL, CKMB, CKMBINDEX, TROPONINI in the last 168 hours.  Lipid Panel: No results for input(s): CHOL, TRIG, HDL, CHOLHDL, VLDL, LDLCALC in the last 168 hours.  Imaging: CT Head Wo Contrast  Result Date: 09/21/2020 CLINICAL DATA:  Dizziness and leg weakness for the past few days. EXAM: CT HEAD WITHOUT CONTRAST TECHNIQUE: Contiguous axial images were obtained from the base of the skull through the vertex without intravenous contrast. COMPARISON:  None. FINDINGS: Brain: Cortical and subcortical hypodensity with loss of the normal gray-white matter differentiation in the posterior right parietal lobe, concerning for acute infarct. Small old lacunar infarct in the left cerebellum. No evidence of hemorrhage, hydrocephalus, extra-axial collection or mass lesion/mass effect. Vascular: No hyperdense vessel or unexpected calcification. Skull: Normal. Negative for fracture or focal lesion. Sinuses/Orbits: No acute finding. Other: None. IMPRESSION: 1. Findings concerning for acute infarct in the posterior right parietal lobe. No hemorrhage. 2. Small old lacunar infarct in the left cerebellum. Electronically Signed   By: Titus Dubin M.D.   On: 09/21/2020 17:14   MR BRAIN W WO CONTRAST  Result Date: 09/22/2020 EXAM: MRI HEAD WITHOUT AND WITH CONTRAST TECHNIQUE: Multiplanar, multiecho pulse sequences of the brain and surrounding  structures were obtained without and with intravenous contrast. CONTRAST:  38mL GADAVIST GADOBUTROL 1 MMOL/ML IV SOLN COMPARISON:  Prior CT from earlier the same day. FINDINGS: Brain: Multiple scattered enhancing lesions are seen throughout the brain, consistent with intracranial metastatic disease. These are seen as follows: - dominant and most prominent of these lesions is positioned at the parasagittal right parietal lobe in  measures 2.0 x 1.5 x 1.8 cm (series 23, image 35). Associated susceptibility artifact compatible with hemorrhage and/or necrosis. Localized vasogenic edema without significant regional mass effect or midline shift. Several additional subcentimeter lesions are seen as follows: - 4 mm lesion at the subcortical anterior left frontal lobe (series 23, image 37) - punctate lesion at the deep white matter of the right frontal lobe (series 24, image 23). - additional possible 4 mm lesion at the subcortical anterior right frontal lobe (series 24, image 25). - 6 mm rim enhancing lesion at the mid left temporal lobe (series 25, image 21). - 4 mm lesion at the right occipital lobe (series 23, image 23). Approximate 6 lesions are seen in total. Underlying cerebral volume within normal limits. No significant cerebral white matter disease. Small remote lacunar infarcts noted at the thalami bilaterally. Additional small focus of encephalomalacia without associated enhancement at the peripheral left cerebellum consistent with a chronic ischemic infarct as well (series 15, image 7). 4 mm focus of restricted diffusion at the subcortical right occipital lobe consistent with a small acute ischemic infarct (series 9, image 33). No associated hemorrhage or mass effect. Additional subtle diffusion abnormality involving the cortical gray matter of the high left frontal lobe also consistent with an acute ischemic infarct (series 9, images 91, 89). w additional subtle 7 mm focus of diffusion abnormality involving the right occipital pole without associated enhancement also suspicious for a small acute ischemic infarct (series 9, image 67). No other evidence for acute or subacute ischemia. Gray-white matter differentiation otherwise maintained. No midline shift or hydrocephalus. No extra-axial fluid collection. Pituitary gland suprasellar region within normal limits. Midline structures intact. Vascular: Major intracranial vascular flow voids  are maintained. Skull and upper cervical spine: Craniocervical junction within normal limits. Metastatic lesion involving the left occipital condyle noted (series 13, image 15). No other definite osseous lesions about the calvarium. Scalp soft tissues within normal limits. Sinuses/Orbits: Globes and orbital soft tissues within normal limits. Mild scattered mucosal thickening noted within the ethmoidal air cells. Paranasal sinuses are otherwise largely clear. No significant mastoid effusion. Inner ear structures within normal limits. Other: None. IMPRESSION: 1. Multiple scattered enhancing lesions throughout the brain, consistent with intracranial metastatic disease. The dominant lesion is positioned at the right parietal lobe and measures up to 2 cm. Localized vasogenic edema without significant regional mass effect about this lesion. Remainder of the lesions are subcentimeter in size. 2. Few scattered subcentimeter acute ischemic nonhemorrhagic infarcts involving the left frontal lobe, right periatrial white matter, and right occipital lobe as above. 3. Osseous metastatic lesion involving the left occipital condyle. 4. 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 Findings communicated by telephone at the time of interpretation on 09/21/2020 at 11:55 p.m. to provider Arlean Hopping, who verbally acknowledged these results. Electronically Signed   By: Jeannine Boga M.D.   On: 09/22/2020 00:24   MR CERVICAL SPINE W WO CONTRAST  Result Date: 09/22/2020 CLINICAL DATA:  Initial evaluation for lower extremity weakness. EXAM: MRI CERVICAL SPINE WITHOUT AND WITH CONTRAST TECHNIQUE: Multiplanar and multiecho pulse sequences of the  cervical spine, to include the craniocervical junction and cervicothoracic junction, were obtained without and with intravenous contrast. CONTRAST:  77mL GADAVIST GADOBUTROL 1 MMOL/ML IV SOLN COMPARISON:  None available. FINDINGS: Alignment: Straightening  of the normal cervical lordosis. Trace anterolisthesis of C7 on T1, chronic and facet mediated. Vertebrae: Osseous metastatic implant seen involving the left occipital condyle. Additional osseous metastasis involves the posterior aspect of the C5 vertebral body. Metastatic lesion largely replaces the C6 vertebral body. Additional metastases seen involving the right posterior elements of C7 and T1. Additional signal abnormality involving the anterior aspect of C4 could reflect an additional lesion versus reactive degenerative change. No associated pathologic fracture. No significant extra osseous extension of tumor in the cervical spine. Cord: Normal signal and morphology. No intramedullary or epidural tumor. No abnormal enhancement. Posterior Fossa, vertebral arteries, paraspinal tissues: 9 mm metastatic soft tissue implant seen involving the subcutaneous fat along the midline of the posterior cervical spine (series 36, image 5). Additional 1.5 cm soft tissue implant involves the right posterior paraspinous musculature at the level of C7 (series 38, image 33). Smaller 1 cm soft tissue implant involving the right posterior paraspinous musculature at C5-6 (series 38, image 25). Abnormal right supraclavicular adenopathy consistent with nodal metastatic disease, with the dominant node measuring 2.9 x 1.8 cm (series 38, image 33). Disc levels: C2-C3: Shallow central disc protrusion minimally indents the ventral thecal sac. Mild left-sided facet hypertrophy. No spinal stenosis. Foramina remain patent. C3-C4: Central disc osteophyte complex indents the ventral thecal sac, partially effacing the ventral CSF. Moderate left-sided facet hypertrophy with bilateral uncovertebral spurring. Resultant mild spinal stenosis without significant cord deformity. Severe left with moderate right C4 foraminal narrowing. C4-C5: Small central disc protrusion indents the ventral thecal sac. No significant stenosis or cord deformity.  Superimposed mild left sided facet hypertrophy with uncovertebral spurring. Resultant moderate left worse than right C5 foraminal stenosis. C5-C6: Disc desiccation without disc bulge. Mild uncovertebral hypertrophy on the right. No spinal stenosis. Foramina remain patent. C6-C7: Mild disc bulge with right greater than left uncovertebral spurring. No spinal stenosis. Severe right with moderate left C7 foraminal narrowing. C7-T1: Trace anterolisthesis. Minimal disc bulge with bilateral uncovertebral hypertrophy. Moderate left with mild right facet degeneration. No significant spinal stenosis. Moderate right C8 foraminal stenosis. No significant left foraminal encroachment. IMPRESSION: 1. 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 No associated pathologic fracture or extraosseous extension. 2. 9 mm metastatic soft tissue implant involving the subcutaneous fat along the midline of the posterior cervical spine. Additional 1 cm in 1.5 cm soft tissue implants involving the right posterior paraspinous musculature at the levels of C5-6 and C7. 3. Abnormal right supraclavicular adenopathy, consistent with nodal metastatic disease. 4. Multilevel cervical spondylosis with resultant mild spinal stenosis at C3-4. Moderate to severe bilateral C4, C5, C7, and right C8 foraminal stenosis as above. Findings communicated by telephone at the time of interpretation on 09/21/2020 at 11:55 p.m. to provider Arlean Hopping, who verbally acknowledged these results. Electronically Signed   By: Jeannine Boga M.D.   On: 09/22/2020 00:42   MR THORACIC SPINE W WO CONTRAST  Result Date: 09/22/2020 CLINICAL DATA:  Initial evaluation for acute lower extremity weakness. EXAM: MRI THORACIC WITHOUT AND WITH CONTRAST TECHNIQUE: Multiplanar and multiecho pulse sequences of the thoracic spine  were obtained without and with intravenous contrast. CONTRAST:  34mL GADAVIST  GADOBUTROL 1 MMOL/ML IV SOLN COMPARISON:  None available. FINDINGS: MRI THORACIC SPINE FINDINGS Alignment: Mild dextroscoliosis. Alignment otherwise normal with preservation of the normal thoracic kyphosis. No listhesis. Vertebrae: Widespread osseous metastatic disease seen throughout the thoracic spine, with prominent lesion seen involving the posterior elements of T3, as well as the T5, T6, T7, T8, T9, and T12 vertebral bodies. Involvement is greatest at the level of T7 where there is an associated pathologic fracture. Associated height loss of up to approximate 50%. Associated extraosseous extension with enhancing tumor seen extending into the ventral and right epidural space (series 42, image 38). Slight cephalad extension of epidural tumor to the levels of T6 and T8 inferiorly. Superimposed prominence of the dorsal epidural fat. Associated severe spinal stenosis with compression/impingement of the thoracic spinal cord at this level. No appreciable cord signal changes at this time. Tumor also extends into the bilateral T6-7 and T7-8 neural foramina. Extension into the right anterolateral paraspinous soft tissues also noted at the level of T7 (series 39, image 1). There is additional epidural extension of tumor into the ventral epidural space at the level of T9 with no more than mild spinal stenosis (series 42, image 50). No other significant epidural tumor. Extraosseous extension of tumor also noted about the T3 spinous process metastasis (series 39, image 8). Abnormal enhancement within the inter spinous region at T7-8 also consistent with extraosseous tumor (series 39, image 8). Cord: Ventral epidural tumor at the levels of T7 and T9 as above. Associated cord impingement without cord signal changes at the level of T7. Signal intensity within the thoracic spinal cord is otherwise normal. Paraspinal and other soft tissues: Paraspinous  tumor centered about the level of T7. Additional extraosseous tumor about the spinous processes of T3 and T7. Large right perihilar mass and/or adenopathy partially visualized within the lungs (series 42, image 26). Extensive mediastinal and right supraclavicular adenopathy. Additional scattered bilateral pulmonary nodules concerning for metastatic disease. Few scattered lesions within the liver measuring up to approximately 2 cm concerning for intrahepatic metastases. Disc levels: No significant underlying degenerative disc disease within the thoracic spine. No other high-grade stenosis or impingement. IMPRESSION: 1. 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 2. Associated pathologic fracture of T7 with up to 50% height loss. Extra osseous extension with epidural tumor at this level results in severe spinal stenosis and compression/impingement of the thoracic spinal cord. No cord signal changes at this time. 3. Additional mild epidural extension of tumor into the ventral epidural space at the level of T9 with no more than mild spinal stenosis. 4. Large right perihilar mass and/or adenopathy, raising the possibility for a primary lung malignancy. Additional extensive mediastinal and supraclavicular adenopathy with widespread pulmonary nodules concerning for metastatic disease. Further evaluation with dedicated cross-sectional imaging of the chest recommended. 5. Few scattered lesions within the liver, concerning for intrahepatic metastases. Findings communicated by telephone at the time of interpretation on 09/21/2020 at 11:55 p.m. to provider Arlean Hopping, who verbally acknowledged these results. Electronically Signed   By: Jeannine Boga M.D.   On: 09/22/2020 01:02   MR Lumbar Spine W Wo Contrast  Result Date: 09/22/2020 CLINICAL DATA:  Initial evaluation for acute lower extremity weakness. EXAM: MRI LUMBAR SPINE WITHOUT AND WITH CONTRAST TECHNIQUE: Multiplanar and  multiecho pulse sequences of the lumbar spine were obtained without and with intravenous contrast. CONTRAST:  48mL GADAVIST GADOBUTROL 1 MMOL/ML IV SOLN COMPARISON:  None available. FINDINGS: Segmentation:  Standard. Alignment:  Physiologic.  Vertebrae: Scattered osseous metastatic disease seen throughout the lumbar spine and sacrum. Most prominent involvement seen at the level of L3 which is largely replaced by a metastatic lesion. Associated pathologic fracture with up to 25% height loss without bony retropulsion. Additional metastatic lesion involves the left transverse process of L2 (series 11, image 14). Few additional lesions noted about the sacrum. No significant extra osseous extension or epidural tumor. Conus medullaris and cauda equina: Conus extends to the T12-L1 level. Conus and cauda equina appear normal. Paraspinal and other soft tissues: 4.6 cm metastatic soft tissue implants seen within the left posterior paraspinous musculature at the level of L4 (series 32, image 14). Few additional subcentimeter nodular densities about the retroperitoneal fat suspicious for small metastases as well (series 35, image 5 on the left, image 14 on the right). Visualized visceral structures otherwise unremarkable. Disc levels: L1-2: Normal interspace. Moderate left with mild right facet hypertrophy. No stenosis. L2-3: Mild disc bulge with disc desiccation. Moderate right worse than left facet hypertrophy. No significant spinal stenosis. Mild right foraminal narrowing. L3-4: Mild diffuse disc bulge with disc desiccation. Superimposed right foraminal to extraforaminal disc protrusion contacts the exiting right L3 nerve root (series 13, image 23). Severe right with moderate left facet hypertrophy. Resultant mild narrowing of the lateral recesses bilaterally. Mild bilateral L3 foraminal stenosis. L4-5: Mild disc bulge. Superimposed shallow right foraminal to extraforaminal disc protrusion with annular fissure, contacting the  exiting right L4 nerve root (series 13, image 29). Moderate left worse than right facet hypertrophy. Resultant mild canal with bilateral subarticular stenosis. Mild bilateral L4 foraminal narrowing. L5-S1: Mild disc bulge. Associated annular fissure at the level of the right neural foramen. Moderate right with mild left facet hypertrophy. No significant spinal stenosis. Mild right foraminal narrowing. IMPRESSION: 1. Scattered osseous metastatic disease throughout the lumbar spine and sacrum as above. Associated pathologic fracture at the level of L3 with up to 25% height loss without bony retropulsion. No epidural or intracanalicular tumor. 2. 4.6 cm metastatic soft tissue implants within the left posterior paraspinous musculature at the level of L4. 3. Right foraminal to extraforaminal disc protrusions at L3-4 and L4-5, contacting and potentially irritating the exiting right L3 and L4 nerve roots respectively. 4. Underlying moderate to advanced multilevel facet hypertrophy as detailed above. Electronically Signed   By: Jeannine Boga M.D.   On: 09/22/2020 01:12   MR SACRUM SI JOINTS W WO CONTRAST  Result Date: 09/22/2020 CLINICAL DATA:  Initial evaluation for acute lower extremity weakness. EXAM: MRI SACRUM WITHOUT AND WITH CONTRAST TECHNIQUE: Multiplanar multi-sequence MR imaging of the sacrum was performed with and without IV contrast. 9 cc of Gadavist was administered. COMPARISON:  None available. FINDINGS: Urinary Tract: Partially distended bladder moderately distended without abnormality. Bowel: Visualized bowel within normal limits without evidence for obstruction or inflammation. Vascular/Lymphatic: Normal vascular flow void seen throughout the visualized pelvis. No appreciable adenopathy. Reproductive:  Unremarkable. Other: Metastatic soft tissue implant measuring approximately 1.8 cm partially visualize within the right gluteal musculature (series 40, image 14). Additional 2 cm implant seen more  superiorly within the right gluteal musculature (series 40, image 4). Musculoskeletal: Dominant metastases seen involving the right sacrum at the level of the S2 segment. Lesion measures approximately 3.5 x 4.3 x 4.1 cm (series 40, image 14). Early extraosseous extension of tumor into the adjacent right S2 foramen without frank neural impingement (series 41, image 13). Additional 2.2 cm metastasis noted more superiorly and anteriorly at the right sacral ala (series 40,  image 16). 4.4 cm osseous metastasis involves the right iliac wing (series 40, image 18). No associated pathologic fracture. No other significant extra osseous extension of tumor. IMPRESSION: 1. 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 Early extraosseous extension into the adjacent right S2 foramen without frank neural impingement. 2. Additional osseous metastases involving the right sacral ala and right iliac wing as above. No pathologic fracture or other complication. 3. Metastatic soft tissue implants within the right gluteal musculature as above. Electronically Signed   By: Jeannine Boga M.D.   On: 09/22/2020 01:19    Assessment: 59 year old male presenting with ataxia. 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 He does not have a prior diagnosis of cancer.  1. Exam reveals BLE weakness without sensory loss.  2. MRI findings are documented extensively in the HPI. Of note, one of the lesions seen on MRI of the thoracic spine is associated with a pathologic fracture of T7 with up to 50% height loss. Extra osseous extension with epidural tumor at this level results in severe spinal stenosis and compression/impingement of the thoracic spinal cord. No cord signal changes at this time. This finding is felt  to be the most likely structural etiology for the patient's chief complaint of weak legs and incoordination while ambulating.   Recommendations: 1. Hematology/Oncology consult 2. Pulmonary consult for possible biopsy of hilar/mediastinal mass 3. Pain management   Electronically signed: Dr. Kerney Elbe 09/22/2020, 5:16 AM

## 2020-09-22 NOTE — ED Notes (Signed)
Pt transported to vascular.  °

## 2020-09-22 NOTE — Progress Notes (Signed)
VASCULAR LAB    Carotid duplex has been performed.  See CV proc for preliminary results.   Danyah Guastella, RVT 09/22/2020, 3:27 PM

## 2020-09-22 NOTE — Consult Note (Addendum)
NAME:  Tyler Barker, MRN:  [id number], DOB:  1961-09-11, LOS: 0 ADMISSION DATE:  09/21/2020, CONSULTATION DATE:  09/22/20 REFERRING MD:  Dwyane Dee , CHIEF COMPLAINT:  Metastatic disease; lung mass   Brief History   59 yo M presenting with gait disturbance, found to have metastatic disease involving brain, bone, liver, lung.  PCCM is consulted regarding the lung mass.   History of present illness   59 yo M with tobacco use disorder, back pain with ongoing chiropractic care, presents to ED 09/21/20 for evaluation of back pain, lower extremity paresthesias and difficultly walking. The patient stated that these symptoms began 1 week ago, and have progressively worsened. The patient visited a chiropractor for these symptoms 11/5 and was told he has a pinched nerve. As the symptoms did not improve, the patient presented to the ED.   CT H acquired which revealed loss of grey-white matter differentiation in posterior R parietal lobe, concerning for possible acute infarct, as well as prior small lacunar infarct of L cerebellum. MRI brain was then acquired  Which revealed scattered enhancing lesions, concerning for metastatic disease. MRI spine, CT c/a/p then acquired which reveal RUL lung mass, numerous pulmonary nodules, extensive thoracic and abdominal adenopathy, liver mets, osseous mets within L spine, Sacrum, and skeletal muscle mets within L paraspinous musculature.   PCCM is consulted regarding the lung mass.   Past Medical History  Tobacco use disorder  Significant Hospital Events   11/6 presents to ED. Found to have CT H findings concerning for acute CVA, and MRI brain/spine, CT c/a/p findings concerning for widely metastatic disease 11/7 PCCM consult regarding lung mass   Consults:  PCCM  Neurology   Procedures:    Significant Diagnostic Tests:  11/6 CT H> loss of grey white differentiate in Posterior R parietal lobe, without hemorrhage. Small old lacunar infarct of L cerebellum 11/6 MRI  brain> multiple enhancing lesions throughout brain c/w metastatic disease 11/6 MRI c/t/l spine, SI> scattered osseous metastatic disease throughout lumbar spine and sacrum. Most prominent at L3, with associated pathologic fx. Soft tissue met within L paraspinous muscle at L4 level.  11/6 CT c/a/p>  -extensive thoracic adenopathy: R supraclavicular node 1.6cm. R paratracheal nodal mass 7.3x5.2x6.8 cm encasing and narrowing SCV and L innominate vein.Subcarinal node 2/7cm, R perihilar node 3x3.5x4.2cm, L AP window node 1.3cm. L hilar nodes 1cm. -Emphysema. RUL mass 3.8 x 2.8 x 3.6 cm. Numerous lung nodules  -Scattered bone lesions within bony thorax, as on MRI. Compression fx T7. Osseus mets L spine and sacrum. L paraspinous intra muscular met.  -Diffuse hepatic mets. Signs of peritoneal mets with lesion adjacent to transverse colon within LUQ   Micro Data:  11/7 SARS Cov2>  Antimicrobials:    Interim history/subjective:  Feels hungry and frustrated -- "it feels like not all the people are telling me stuff."   We discussed his MRI and CT c/a/p findings and talked about our suspicion for cancer. We discussed the reason Pulm has been consulted, as well as tried to explain the role of other team members involved thus far.   Objective   Blood pressure (!) 102/32, pulse 68, temperature 98.3 F (36.8 C), temperature source Oral, resp. rate 15, height '5\' 10"'  (1.778 m), weight 90.7 kg, SpO2 95 %.       No intake or output data in the 24 hours ending 09/22/20 1057 Filed Weights   09/21/20 1545  Weight: 90.7 kg    Examination: General: Chronically ill appearing middle aged M,  reclined in ED, no acute distress  HENT: Poor dentition. NCAT. Anicteric sclera. Pink mm.  Lungs: Symmetrical chest expansion, no accessory use on RA.  Cardiovascular: rrr s1s2 no rgm. Cap refill< 3 seconds Abdomen: soft round non tender Extremities: Symmetrical bulk and tone no obvious joint deformity  Neuro: Awake  alert oriented x3. PERRL. BLE weakness GU: defer Psych: Appropriate for age and situation. 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, liver, lung, bone, soft tissue, thoracic and abdominal adenopathy -R perihilar lung mass -also with supraclavicular node P -neurology is following -Recommend IR consultation for possible supraclavicular node bx  -Recommend RadOnc consult -I will also consult case management-- pt has a lot of worry regarding social circumstances (veteran, lives in [place]) -pain management   Best practice:  Diet: NPO  Pain/Anxiety/Delirium protocol (if indicated): prn dilaudid VAP protocol (if indicated): na DVT prophylaxis: lovenox GI prophylaxis: na Glucose control: monitor Mobility: BR Code Status: Full  Family Communication: patient declines at present. His daughter lives in [place] and he is unsure when he would like to update her on his present illness Disposition: Med Tele  Labs   CBC: Recent Labs  Lab 09/21/20 1605 09/22/20 0611  WBC 12.0* 12.7*  NEUTROABS 6.5  --   HGB 15.7 15.0  HCT 47.1 45.0  MCV 96.3 97.2  PLT 288 562    Basic Metabolic Panel: Recent Labs  Lab 09/21/20 1605 09/22/20 0611  NA 136 135  K 3.7 4.2  CL 101 99  CO2 24 24  GLUCOSE 98 91  BUN 15 19  CREATININE 1.76* 1.79*  CALCIUM 9.5 9.3   GFR: Estimated Creatinine Clearance: 51.6 mL/min (A) (by C-G formula based on SCr of 1.79 mg/dL (H)). Recent Labs  Lab 09/21/20 1605 09/22/20 0611  WBC 12.0* 12.7*    Liver Function Tests: Recent Labs  Lab 09/21/20 1605  AST 29  ALT 22  ALKPHOS 105  BILITOT 0.9  PROT 7.9  ALBUMIN 3.8   No results for input(s): LIPASE, AMYLASE in the last 168 hours. No results for input(s): AMMONIA in the last 168 hours.  ABG No results found for: PHART, PCO2ART, PO2ART, HCO3,  TCO2, ACIDBASEDEF, O2SAT   Coagulation Profile: No results for input(s): INR, PROTIME in the last 168 hours.  Cardiac Enzymes: No results for input(s): CKTOTAL, CKMB, CKMBINDEX, TROPONINI in the last 168 hours.  HbA1C: Hgb A1c MFr Bld  Date/Time Value Ref Range Status  09/22/2020 06:11 AM 5.3 4.8 - 5.6 % Final    Comment:    (NOTE) Pre diabetes:          5.7%-6.4%  Diabetes:              >6.4%  Glycemic control for   <7.0% adults with diabetes     CBG: No results for input(s): GLUCAP in the last 168 hours.  Review of Systems:     + dizziness, weakness, difficulty walking, back pain, weight loss - SOB fever chills chest pain palpitations URI sx, changes in appetite, n/v/d, painful urination, blurry vision   Past Medical History  He,  has a past medical history of Medical history non-contributory.   Surgical History    Past Surgical History:  Procedure Laterality Date  . APPENDECTOMY  1979     Social History   reports that he has been  smoking. He has been smoking about 1.00 pack per day. He has never used smokeless tobacco. He reports previous alcohol use. He reports previous drug use.   Family History   His family history includes Alcoholism in his paternal grandmother; Diabetes in his maternal aunt.   Allergies No Known Allergies   Home Medications  Prior to Admission medications   Medication Sig Start Date End Date Taking? Authorizing Provider  naproxen sodium (ALEVE) 220 MG tablet Take 220 mg by mouth 2 (two) times daily as needed (pain).   Yes [provider]     Eliseo Gum MSN, AGACNP-BC [place]  [phone] 09/22/2020, 1:10 PM

## 2020-09-22 NOTE — Care Plan (Signed)
This 59 years old male with medical history significant for back injury 10 years ago, chronic smoker presented in the emergency department for the evaluation of back pain associated with lower extremity numbness and difficulty ambulating. Noncontrast head CT raised concern for acute infarction involving  posterior right parietal lobe.  MRI brain is concerning for multiple scattered enhancing lesions throughout the brain consistent with metastatic disease as well as few scattered subcentimeter acute ischemic infarctions involving the left frontal lobe.  Neurology consulted recommended pulmonology consult for lung biopsy.  Heme-onc consulted, radiation oncology consulted, awaiting recommendation.  Pulmonologist recommend IR consultation for supraclavicular lymph node biopsy. Started patient on decadron 4 mg q 12hr per hem /Onco. Patient was seen and examined in the ER patient seems frustrated with a lot of information.

## 2020-09-22 NOTE — Progress Notes (Signed)
  Echocardiogram 2D Echocardiogram has been performed.  Jannett Celestine 09/22/2020, 1:34 PM

## 2020-09-22 NOTE — ED Notes (Signed)
Patient transported to CT 

## 2020-09-22 NOTE — TOC Initial Note (Signed)
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  He has a address he uses but no house he lives in . His "stuff" is in his truck and in a hotel he was staying in. 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 He feels he got the run around from [place]. He has a refinance on his truck and owes a lot on his credit card. He is upset and thinking of all the things he needs to do. We talked about things he could control and things he could not.  Financial counseling and  [employer] CSW  need to be called Monday for consult.  Disability and medicaid potential.Will need wheelchair . He is thinking about calling his daughter, has not called family yet.   Expected Discharge Plan: [facility] Barriers to Discharge: Continued Medical Work up   Patient Goals and CMS Choice        Expected Discharge Plan and Services Expected Discharge Plan: [place]   Discharge Planning Services: CM Consult   Living arrangements for the past 2 months: No permanent address (Lives in [place])                                      Prior Living Arrangements/Services Living arrangements for the past 2 months: No permanent address (Lives in [place]) Lives with:: Self Patient language and need for interpreter reviewed:: Yes Do you feel safe going back to the place where you live?: Yes      Need for Family Participation in Patient Care: Yes (Comment) Care giver support system in place?: Yes (comment)   Criminal Activity/Legal Involvement Pertinent to Current Situation/Hospitalization:  No - Comment as needed  Activities of Daily Living      Permission Sought/Granted                  Emotional Assessment   Attitude/Demeanor/Rapport: Angry Affect (typically observed): Afraid/Fearful, Agitated Orientation: : Oriented to Self, Oriented to Place, Oriented to  Time, Oriented to Situation Alcohol / Substance Use: Not Applicable Psych Involvement: No (comment)  Admission diagnosis:  Metastatic disease ([place]) [C79.9] Patient Active Problem List   Diagnosis Date Noted  . Metastatic disease ([place]) 09/22/2020  . Renal insufficiency 09/22/2020  . Current smoker 09/22/2020  . Acute ischemic stroke ([place]) 09/22/2020  . Multiple pulmonary nodules 09/22/2020  . Liver lesion 09/22/2020   PCP:  Patient, No Pcp Per Pharmacy:  No Pharmacies Listed    Social Determinants of Health (SDOH) Interventions    Readmission Risk Interventions No flowsheet data found.

## 2020-09-22 NOTE — H&P (Addendum)
History and Physical    Tyler Barker DOB: Aug 09, 1961 DOA: 09/21/2020  PCP: Patient, No Pcp Per   Patient coming from: Home   Chief Complaint: Back pain, leg numbness, difficulty walking   HPI: Tyler Barker is a 59 y.o. male with history of back injury approximately 10 years ago, smokes at least 1 pack/day, and now presents emergency department for evaluation of back pain, lower extremity numbness, and difficulty ambulating.  Patient reports that he had been in his usual state until just shy of a week ago when he began to develop worsening back pain, lower extremity numbness, and difficulty walking.  Back pain is mainly in the mid back and progressively worsening.  He has had progressive difficulty ambulating and states that he is now unable to ambulate without assistance.  There was no preceding injury and he denies any associated fevers or chills.  He denies any significant cough recently, has not been particularly short of breath, and has not noticed any headaches or change in vision or hearing.   ED Course: Upon arrival to the ED, patient is found to be afebrile, saturating well on room air, and with stable blood pressure.  Chemistry panel is notable for creatinine 1.76 with no prior labs available for comparison.  CBC features a leukocytosis to 12,000.  Noncontrast head CT raised concern for acute infarction involving the posterior right parietal lobe.  Was also notable for small old lacunar infarctions in the left cerebellum.  MRI brain is concerning for multiple scattered enhancing lesions throughout the brain consistent with metastatic disease as well as few scattered subcentimeter acute ischemic infarctions involving the left frontal lobe.  MRI brain also reveals osseous metastases.  MRI cervical, lumbar, and thoracic spine, as well as MRI sacrum and SI joints are all notable for osseous metastases and soft tissue implants as well as a right hilar mass, multiple pulmonary  nodules, and suspected liver lesions.  COVID-19 screening test has not yet resulted.  CT the chest, abdomen, and pelvis is ordered but not yet performed.  Neurology was consulted by the ED physician and hospitalists asked to admit.  Review of Systems:  All other systems reviewed and apart from HPI, are negative.  Past Medical History:  Diagnosis Date  . Medical history non-contributory     Past Surgical History:  Procedure Laterality Date  . APPENDECTOMY  1979    Social History:   reports that he has been smoking. He has been smoking about 1.00 pack per day. He has never used smokeless tobacco. He reports previous alcohol use. He reports previous drug use.  No Known Allergies  Family History  Problem Relation Age of Onset  . Diabetes Maternal Aunt   . Alcoholism Paternal Grandmother      Prior to Admission medications   Medication Sig Start Date End Date Taking? Authorizing Provider  naproxen sodium (ALEVE) 220 MG tablet Take 220 mg by mouth 2 (two) times daily as needed (pain).   Yes [provider]    Physical Exam: Vitals:   09/22/20 0045 09/22/20 0100 09/22/20 0115 09/22/20 0130  BP: 110/63 (!) 110/57 113/63 (!) 107/58  Pulse: 78 75 75 83  Resp:      Temp:      TempSrc:      SpO2: 90% 92% 92% 94%  Weight:      Height:        Constitutional: NAD, calm  Eyes: PERTLA, lids and conjunctivae normal ENMT: Mucous membranes are moist. Posterior pharynx  clear of any exudate or lesions.   Neck: supple, no thyromegaly Respiratory: no wheezing, no crackles. No accessory muscle use.  Cardiovascular: S1 & S2 heard, regular rate and rhythm. No extremity edema.   Abdomen: No distension, no tenderness, soft. Bowel sounds active.  Musculoskeletal: no clubbing / cyanosis. No joint deformity upper and lower extremities.   Skin: no significant rashes, lesions, ulcers. Warm, dry, well-perfused. Neurologic: no facial asymmetry, dysarthria, or aphasia. PERRL. Sensation to  light touch diminished in LEs. Moving all extremities.  Psychiatric: Alert and oriented to person, place, and situation. Calm and cooperative.    Labs and Imaging on Admission: I have personally reviewed following labs and imaging studies  CBC: Recent Labs  Lab 09/21/20 1605  WBC 12.0*  NEUTROABS 6.5  HGB 15.7  HCT 47.1  MCV 96.3  PLT 921   Basic Metabolic Panel: Recent Labs  Lab 09/21/20 1605  NA 136  K 3.7  CL 101  CO2 24  GLUCOSE 98  BUN 15  CREATININE 1.76*  CALCIUM 9.5   GFR: Estimated Creatinine Clearance: 52.5 mL/min (A) (by C-G formula based on SCr of 1.76 mg/dL (H)). Liver Function Tests: Recent Labs  Lab 09/21/20 1605  AST 29  ALT 22  ALKPHOS 105  BILITOT 0.9  PROT 7.9  ALBUMIN 3.8   No results for input(s): LIPASE, AMYLASE in the last 168 hours. No results for input(s): AMMONIA in the last 168 hours. Coagulation Profile: No results for input(s): INR, PROTIME in the last 168 hours. Cardiac Enzymes: No results for input(s): CKTOTAL, CKMB, CKMBINDEX, TROPONINI in the last 168 hours. BNP (last 3 results) No results for input(s): PROBNP in the last 8760 hours. HbA1C: No results for input(s): HGBA1C in the last 72 hours. CBG: No results for input(s): GLUCAP in the last 168 hours. Lipid Profile: No results for input(s): CHOL, HDL, LDLCALC, TRIG, CHOLHDL, LDLDIRECT in the last 72 hours. Thyroid Function Tests: No results for input(s): TSH, T4TOTAL, FREET4, T3FREE, THYROIDAB in the last 72 hours. Anemia Panel: No results for input(s): VITAMINB12, FOLATE, FERRITIN, TIBC, IRON, RETICCTPCT in the last 72 hours. Urine analysis: No results found for: COLORURINE, APPEARANCEUR, LABSPEC, PHURINE, GLUCOSEU, HGBUR, BILIRUBINUR, KETONESUR, PROTEINUR, UROBILINOGEN, NITRITE, LEUKOCYTESUR Sepsis Labs: @LABRCNTIP (procalcitonin:4,lacticidven:4) )No results found for this or any previous visit (from the past 240 hour(s)).   Radiological Exams on Admission: CT Head  Wo Contrast  Result Date: 09/21/2020 CLINICAL DATA:  Dizziness and leg weakness for the past few days. EXAM: CT HEAD WITHOUT CONTRAST TECHNIQUE: Contiguous axial images were obtained from the base of the skull through the vertex without intravenous contrast. COMPARISON:  None. FINDINGS: Brain: Cortical and subcortical hypodensity with loss of the normal gray-white matter differentiation in the posterior right parietal lobe, concerning for acute infarct. Small old lacunar infarct in the left cerebellum. No evidence of hemorrhage, hydrocephalus, extra-axial collection or mass lesion/mass effect. Vascular: No hyperdense vessel or unexpected calcification. Skull: Normal. Negative for fracture or focal lesion. Sinuses/Orbits: No acute finding. Other: None. IMPRESSION: 1. Findings concerning for acute infarct in the posterior right parietal lobe. No hemorrhage. 2. Small old lacunar infarct in the left cerebellum. Electronically Signed   By: Titus Dubin M.D.   On: 09/21/2020 17:14   MR BRAIN W WO CONTRAST  Result Date: 09/22/2020 EXAM: MRI HEAD WITHOUT AND WITH CONTRAST TECHNIQUE: Multiplanar, multiecho pulse sequences of the brain and surrounding structures were obtained without and with intravenous contrast. CONTRAST:  66mL GADAVIST GADOBUTROL 1 MMOL/ML IV SOLN COMPARISON:  Prior CT from earlier the same day. FINDINGS: Brain: Multiple scattered enhancing lesions are seen throughout the brain, consistent with intracranial metastatic disease. These are seen as follows: - dominant and most prominent of these lesions is positioned at the parasagittal right parietal lobe in measures 2.0 x 1.5 x 1.8 cm (series 23, image 35). Associated susceptibility artifact compatible with hemorrhage and/or necrosis. Localized vasogenic edema without significant regional mass effect or midline shift. Several additional subcentimeter lesions are seen as follows: - 4 mm lesion at the subcortical anterior left frontal lobe (series 23,  image 37) - punctate lesion at the deep white matter of the right frontal lobe (series 24, image 23). - additional possible 4 mm lesion at the subcortical anterior right frontal lobe (series 24, image 25). - 6 mm rim enhancing lesion at the mid left temporal lobe (series 25, image 21). - 4 mm lesion at the right occipital lobe (series 23, image 23). Approximate 6 lesions are seen in total. Underlying cerebral volume within normal limits. No significant cerebral white matter disease. Small remote lacunar infarcts noted at the thalami bilaterally. Additional small focus of encephalomalacia without associated enhancement at the peripheral left cerebellum consistent with a chronic ischemic infarct as well (series 15, image 7). 4 mm focus of restricted diffusion at the subcortical right occipital lobe consistent with a small acute ischemic infarct (series 9, image 33). No associated hemorrhage or mass effect. Additional subtle diffusion abnormality involving the cortical gray matter of the high left frontal lobe also consistent with an acute ischemic infarct (series 9, images 91, 89). w additional subtle 7 mm focus of diffusion abnormality involving the right occipital pole without associated enhancement also suspicious for a small acute ischemic infarct (series 9, image 67). No other evidence for acute or subacute ischemia. Gray-white matter differentiation otherwise maintained. No midline shift or hydrocephalus. No extra-axial fluid collection. Pituitary gland suprasellar region within normal limits. Midline structures intact. Vascular: Major intracranial vascular flow voids are maintained. Skull and upper cervical spine: Craniocervical junction within normal limits. Metastatic lesion involving the left occipital condyle noted (series 13, image 15). No other definite osseous lesions about the calvarium. Scalp soft tissues within normal limits. Sinuses/Orbits: Globes and orbital soft tissues within normal limits. Mild  scattered mucosal thickening noted within the ethmoidal air cells. Paranasal sinuses are otherwise largely clear. No significant mastoid effusion. Inner ear structures within normal limits. Other: None. IMPRESSION: 1. Multiple scattered enhancing lesions throughout the brain, consistent with intracranial metastatic disease. The dominant lesion is positioned at the right parietal lobe and measures up to 2 cm. Localized vasogenic edema without significant regional mass effect about this lesion. Remainder of the lesions are subcentimeter in size. 2. Few scattered subcentimeter acute ischemic nonhemorrhagic infarcts involving the left frontal lobe, right periatrial white matter, and right occipital lobe as above. 3. Osseous metastatic lesion involving the left occipital condyle. 4. 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 Findings communicated by telephone at the time of interpretation on 09/21/2020 at 11:55 p.m. to provider Arlean Hopping, who verbally acknowledged these results. Electronically Signed   By: Jeannine Boga M.D.   On: 09/22/2020 00:24   MR CERVICAL SPINE W WO CONTRAST  Result Date: 09/22/2020 CLINICAL DATA:  Initial evaluation for lower extremity weakness. EXAM: MRI CERVICAL SPINE WITHOUT AND WITH CONTRAST TECHNIQUE: Multiplanar and multiecho pulse sequences of the cervical spine, to include the craniocervical junction and cervicothoracic junction, were obtained without and with intravenous contrast. CONTRAST:  31mL GADAVIST GADOBUTROL 1 MMOL/ML IV SOLN COMPARISON:  None available. FINDINGS: Alignment: Straightening of the normal cervical lordosis. Trace anterolisthesis of C7 on T1, chronic and facet mediated. Vertebrae: Osseous metastatic implant seen involving the left occipital condyle. Additional osseous metastasis involves the posterior aspect of the C5 vertebral body. Metastatic lesion largely replaces the C6 vertebral body. Additional metastases seen  involving the right posterior elements of C7 and T1. Additional signal abnormality involving the anterior aspect of C4 could reflect an additional lesion versus reactive degenerative change. No associated pathologic fracture. No significant extra osseous extension of tumor in the cervical spine. Cord: Normal signal and morphology. No intramedullary or epidural tumor. No abnormal enhancement. Posterior Fossa, vertebral arteries, paraspinal tissues: 9 mm metastatic soft tissue implant seen involving the subcutaneous fat along the midline of the posterior cervical spine (series 36, image 5). Additional 1.5 cm soft tissue implant involves the right posterior paraspinous musculature at the level of C7 (series 38, image 33). Smaller 1 cm soft tissue implant involving the right posterior paraspinous musculature at C5-6 (series 38, image 25). Abnormal right supraclavicular adenopathy consistent with nodal metastatic disease, with the dominant node measuring 2.9 x 1.8 cm (series 38, image 33). Disc levels: C2-C3: Shallow central disc protrusion minimally indents the ventral thecal sac. Mild left-sided facet hypertrophy. No spinal stenosis. Foramina remain patent. C3-C4: Central disc osteophyte complex indents the ventral thecal sac, partially effacing the ventral CSF. Moderate left-sided facet hypertrophy with bilateral uncovertebral spurring. Resultant mild spinal stenosis without significant cord deformity. Severe left with moderate right C4 foraminal narrowing. C4-C5: Small central disc protrusion indents the ventral thecal sac. No significant stenosis or cord deformity. Superimposed mild left sided facet hypertrophy with uncovertebral spurring. Resultant moderate left worse than right C5 foraminal stenosis. C5-C6: Disc desiccation without disc bulge. Mild uncovertebral hypertrophy on the right. No spinal stenosis. Foramina remain patent. C6-C7: Mild disc bulge with right greater than left uncovertebral spurring. No  spinal stenosis. Severe right with moderate left C7 foraminal narrowing. C7-T1: Trace anterolisthesis. Minimal disc bulge with bilateral uncovertebral hypertrophy. Moderate left with mild right facet degeneration. No significant spinal stenosis. Moderate right C8 foraminal stenosis. No significant left foraminal encroachment. IMPRESSION: 1. 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 No associated pathologic fracture or extraosseous extension. 2. 9 mm metastatic soft tissue implant involving the subcutaneous fat along the midline of the posterior cervical spine. Additional 1 cm in 1.5 cm soft tissue implants involving the right posterior paraspinous musculature at the levels of C5-6 and C7. 3. Abnormal right supraclavicular adenopathy, consistent with nodal metastatic disease. 4. Multilevel cervical spondylosis with resultant mild spinal stenosis at C3-4. Moderate to severe bilateral C4, C5, C7, and right C8 foraminal stenosis as above. Findings communicated by telephone at the time of interpretation on 09/21/2020 at 11:55 p.m. to provider Arlean Hopping, who verbally acknowledged these results. Electronically Signed   By: Jeannine Boga M.D.   On: 09/22/2020 00:42   MR THORACIC SPINE W WO CONTRAST  Result Date: 09/22/2020 CLINICAL DATA:  Initial evaluation for acute lower extremity weakness. EXAM: MRI THORACIC WITHOUT AND WITH CONTRAST TECHNIQUE: Multiplanar and multiecho pulse sequences of the thoracic spine were obtained without and with intravenous contrast. CONTRAST:  66mL GADAVIST GADOBUTROL 1 MMOL/ML IV SOLN COMPARISON:  None available. FINDINGS: MRI THORACIC SPINE FINDINGS Alignment: Mild dextroscoliosis. Alignment otherwise normal  with preservation of the normal thoracic kyphosis. No listhesis. Vertebrae: Widespread osseous metastatic disease seen  throughout the thoracic spine, with prominent lesion seen involving the posterior elements of T3, as well as the T5, T6, T7, T8, T9, and T12 vertebral bodies. Involvement is greatest at the level of T7 where there is an associated pathologic fracture. Associated height loss of up to approximate 50%. Associated extraosseous extension with enhancing tumor seen extending into the ventral and right epidural space (series 42, image 38). Slight cephalad extension of epidural tumor to the levels of T6 and T8 inferiorly. Superimposed prominence of the dorsal epidural fat. Associated severe spinal stenosis with compression/impingement of the thoracic spinal cord at this level. No appreciable cord signal changes at this time. Tumor also extends into the bilateral T6-7 and T7-8 neural foramina. Extension into the right anterolateral paraspinous soft tissues also noted at the level of T7 (series 39, image 1). There is additional epidural extension of tumor into the ventral epidural space at the level of T9 with no more than mild spinal stenosis (series 42, image 50). No other significant epidural tumor. Extraosseous extension of tumor also noted about the T3 spinous process metastasis (series 39, image 8). Abnormal enhancement within the inter spinous region at T7-8 also consistent with extraosseous tumor (series 39, image 8). Cord: Ventral epidural tumor at the levels of T7 and T9 as above. Associated cord impingement without cord signal changes at the level of T7. Signal intensity within the thoracic spinal cord is otherwise normal. Paraspinal and other soft tissues: Paraspinous tumor centered about the level of T7. Additional extraosseous tumor about the spinous processes of T3 and T7. Large right perihilar mass and/or adenopathy partially visualized within the lungs (series 42, image 26). Extensive mediastinal and right supraclavicular adenopathy. Additional scattered bilateral pulmonary nodules concerning for metastatic  disease. Few scattered lesions within the liver measuring up to approximately 2 cm concerning for intrahepatic metastases. Disc levels: No significant underlying degenerative disc disease within the thoracic spine. No other high-grade stenosis or impingement. IMPRESSION: 1. 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 2. Associated pathologic fracture of T7 with up to 50% height loss. Extra osseous extension with epidural tumor at this level results in severe spinal stenosis and compression/impingement of the thoracic spinal cord. No cord signal changes at this time. 3. Additional mild epidural extension of tumor into the ventral epidural space at the level of T9 with no more than mild spinal stenosis. 4. Large right perihilar mass and/or adenopathy, raising the possibility for a primary lung malignancy. Additional extensive mediastinal and supraclavicular adenopathy with widespread pulmonary nodules concerning for metastatic disease. Further evaluation with dedicated cross-sectional imaging of the chest recommended. 5. Few scattered lesions within the liver, concerning for intrahepatic metastases. Findings communicated by telephone at the time of interpretation on 09/21/2020 at 11:55 p.m. to provider Arlean Hopping, who verbally acknowledged these results. Electronically Signed   By: Jeannine Boga M.D.   On: 09/22/2020 01:02   MR Lumbar Spine W Wo Contrast  Result Date: 09/22/2020 CLINICAL DATA:  Initial evaluation for acute lower extremity weakness. EXAM: MRI LUMBAR SPINE WITHOUT AND WITH CONTRAST TECHNIQUE: Multiplanar and multiecho pulse sequences of the lumbar spine were obtained without and with intravenous contrast. CONTRAST:  28mL GADAVIST GADOBUTROL 1 MMOL/ML IV SOLN COMPARISON:  None available. FINDINGS: Segmentation:  Standard. Alignment:  Physiologic. Vertebrae: Scattered osseous metastatic disease seen throughout the lumbar spine and sacrum. Most prominent  involvement seen at the level  of L3 which is largely replaced by a metastatic lesion. Associated pathologic fracture with up to 25% height loss without bony retropulsion. Additional metastatic lesion involves the left transverse process of L2 (series 11, image 14). Few additional lesions noted about the sacrum. No significant extra osseous extension or epidural tumor. Conus medullaris and cauda equina: Conus extends to the T12-L1 level. Conus and cauda equina appear normal. Paraspinal and other soft tissues: 4.6 cm metastatic soft tissue implants seen within the left posterior paraspinous musculature at the level of L4 (series 32, image 14). Few additional subcentimeter nodular densities about the retroperitoneal fat suspicious for small metastases as well (series 35, image 5 on the left, image 14 on the right). Visualized visceral structures otherwise unremarkable. Disc levels: L1-2: Normal interspace. Moderate left with mild right facet hypertrophy. No stenosis. L2-3: Mild disc bulge with disc desiccation. Moderate right worse than left facet hypertrophy. No significant spinal stenosis. Mild right foraminal narrowing. L3-4: Mild diffuse disc bulge with disc desiccation. Superimposed right foraminal to extraforaminal disc protrusion contacts the exiting right L3 nerve root (series 13, image 23). Severe right with moderate left facet hypertrophy. Resultant mild narrowing of the lateral recesses bilaterally. Mild bilateral L3 foraminal stenosis. L4-5: Mild disc bulge. Superimposed shallow right foraminal to extraforaminal disc protrusion with annular fissure, contacting the exiting right L4 nerve root (series 13, image 29). Moderate left worse than right facet hypertrophy. Resultant mild canal with bilateral subarticular stenosis. Mild bilateral L4 foraminal narrowing. L5-S1: Mild disc bulge. Associated annular fissure at the level of the right neural foramen. Moderate right with mild left facet hypertrophy. No  significant spinal stenosis. Mild right foraminal narrowing. IMPRESSION: 1. Scattered osseous metastatic disease throughout the lumbar spine and sacrum as above. Associated pathologic fracture at the level of L3 with up to 25% height loss without bony retropulsion. No epidural or intracanalicular tumor. 2. 4.6 cm metastatic soft tissue implants within the left posterior paraspinous musculature at the level of L4. 3. Right foraminal to extraforaminal disc protrusions at L3-4 and L4-5, contacting and potentially irritating the exiting right L3 and L4 nerve roots respectively. 4. Underlying moderate to advanced multilevel facet hypertrophy as detailed above. Electronically Signed   By: Jeannine Boga M.D.   On: 09/22/2020 01:12   MR SACRUM SI JOINTS W WO CONTRAST  Result Date: 09/22/2020 CLINICAL DATA:  Initial evaluation for acute lower extremity weakness. EXAM: MRI SACRUM WITHOUT AND WITH CONTRAST TECHNIQUE: Multiplanar multi-sequence MR imaging of the sacrum was performed with and without IV contrast. 9 cc of Gadavist was administered. COMPARISON:  None available. FINDINGS: Urinary Tract: Partially distended bladder moderately distended without abnormality. Bowel: Visualized bowel within normal limits without evidence for obstruction or inflammation. Vascular/Lymphatic: Normal vascular flow void seen throughout the visualized pelvis. No appreciable adenopathy. Reproductive:  Unremarkable. Other: Metastatic soft tissue implant measuring approximately 1.8 cm partially visualize within the right gluteal musculature (series 40, image 14). Additional 2 cm implant seen more superiorly within the right gluteal musculature (series 40, image 4). Musculoskeletal: Dominant metastases seen involving the right sacrum at the level of the S2 segment. Lesion measures approximately 3.5 x 4.3 x 4.1 cm (series 40, image 14). Early extraosseous extension of tumor into the adjacent right S2 foramen without frank neural  impingement (series 41, image 13). Additional 2.2 cm metastasis noted more superiorly and anteriorly at the right sacral ala (series 40, image 16). 4.4 cm osseous metastasis involves the right iliac wing (series 40, image 18). No associated pathologic fracture.  No other significant extra osseous extension of tumor. IMPRESSION: 1. 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 Early extraosseous extension into the adjacent right S2 foramen without frank neural impingement. 2. Additional osseous metastases involving the right sacral ala and right iliac wing as above. No pathologic fracture or other complication. 3. Metastatic soft tissue implants within the right gluteal musculature as above. Electronically Signed   By: Jeannine Boga M.D.   On: 09/22/2020 01:19    Assessment/Plan   1. Metastatic disease  - Presents with several days of worsening back pain, leg numbness, and difficulty ambulating and is found to have widespread metastatic disease  - Right perihilar lung mass on MRI raises possibility of primary lung malignancy   - CT chest/abd/pelvis is pending and oncology was consulted by ED physician   2. Acute ischemic CVA  - Presents with several days of worsening back pain, leg numbness, and difficulty ambulating and is found to have widespread metastatic disease and a few scattered subcentimeter acute ischemic infarcts involving both hemispheres  - Neurology consulting and much appreciated, will follow-up on recommendations    3. Renal insufficiency, unknown chronicity  - SCr is 1.76 on admission with no prior labs available for comparison  - Check UA and urine chemistries, check CT abd/pelvis  - Renally-dose medications, monitor    DVT prophylaxis: Lovenox  Code Status: Full  Family Communication: Discussed with patient  Disposition Plan:  Patient is from: Home Anticipated d/c is to: TBD  Anticipated d/c date is: TBD  Patient currently:  pending further imaging, therapy assessments, neurology and oncology consultations  Consults called: Neurology and oncology consulted by ED physician  Admission status: Observation     Vianne Bulls, MD [facility]  09/22/2020, 2:40 AM

## 2020-09-23 ENCOUNTER — Other Ambulatory Visit: Payer: Self-pay

## 2020-09-23 ENCOUNTER — Ambulatory Visit
Admit: 2020-09-23 | Discharge: 2020-09-23 | Disposition: A | Discharge status: Home / Self Care | Payer: Self-pay | Attending: Radiation Oncology | Admitting: Radiation Oncology

## 2020-09-23 ENCOUNTER — Inpatient Hospital Stay (HOSPITAL_COMMUNITY): Payer: Self-pay

## 2020-09-23 ENCOUNTER — Encounter (HOSPITAL_COMMUNITY): Payer: Self-pay | Admitting: Family Medicine

## 2020-09-23 DIAGNOSIS — C799 Secondary malignant neoplasm of unspecified site: Secondary | ICD-10-CM

## 2020-09-23 DIAGNOSIS — R918 Other nonspecific abnormal finding of lung field: Secondary | ICD-10-CM

## 2020-09-23 DIAGNOSIS — C7931 Secondary malignant neoplasm of brain: Secondary | ICD-10-CM

## 2020-09-23 LAB — BASIC METABOLIC PANEL
Anion gap: 10 (ref 5–15)
BUN: 20 mg/dL (ref 6–20)
CO2: 22 mmol/L (ref 22–32)
Calcium: 9 mg/dL (ref 8.9–10.3)
Chloride: 102 mmol/L (ref 98–111)
Creatinine, Ser: 1.87 mg/dL — ABNORMAL HIGH (ref 0.61–1.24)
GFR, Estimated: 41 mL/min — ABNORMAL LOW (ref >60)
Glucose, Bld: 131 mg/dL — ABNORMAL HIGH (ref 70–99)
Potassium: 4 mmol/L (ref 3.5–5.1)
Sodium: 134 mmol/L — ABNORMAL LOW (ref 135–145)

## 2020-09-23 LAB — CBC
HCT: 43.2 % (ref 39.0–52.0)
Hemoglobin: 14.4 g/dL (ref 13.0–17.0)
MCH: 33 pg (ref 26.0–34.0)
MCHC: 33.3 g/dL (ref 30.0–36.0)
MCV: 98.9 fL (ref 80.0–100.0)
Platelets: 284 10*3/uL (ref 150–400)
RBC: 4.37 MIL/uL (ref 4.22–5.81)
RDW: 12.6 % (ref 11.5–15.5)
WBC: 9.8 10*3/uL (ref 4.0–10.5)
nRBC: 0 % (ref 0.0–0.2)

## 2020-09-23 MED ORDER — LIDOCAINE-EPINEPHRINE 1 %-1:100000 IJ SOLN
INTRAMUSCULAR | Status: AC
  Filled 2020-09-23: qty 1

## 2020-09-23 MED ORDER — FENTANYL CITRATE (PF) 100 MCG/2ML IJ SOLN
INTRAMUSCULAR | Status: AC
  Filled 2020-09-23: qty 2

## 2020-09-23 MED ORDER — MIDAZOLAM HCL 2 MG/2ML IJ SOLN
INTRAMUSCULAR | Status: AC
  Filled 2020-09-23: qty 2

## 2020-09-23 MED ORDER — FENTANYL CITRATE (PF) 100 MCG/2ML IJ SOLN
INTRAMUSCULAR | Status: AC | PRN
Start: 2020-09-23 — End: 2020-09-23
  Administered 2020-09-23: 50 ug via INTRAVENOUS

## 2020-09-23 MED ORDER — MIDAZOLAM HCL 2 MG/2ML IJ SOLN
INTRAMUSCULAR | Status: AC | PRN
  Administered 2020-09-23: 1 mg via INTRAVENOUS
  Administered 2020-09-23: 0.5 mg via INTRAVENOUS

## 2020-09-23 NOTE — ED Notes (Signed)
Patient transported to interventional radiology

## 2020-09-23 NOTE — ED Notes (Signed)
Pt given a cup of orange juice.

## 2020-09-23 NOTE — Consult Note (Addendum)
[place]  Telephone:([phone] Fax:([phone]) [phone]   MEDICAL ONCOLOGY - INITIAL CONSULTATION  Referral MD: Dr. Shawna Clamp  Reason for Referral: Abnormal imaging studies including brain lesions, bone lesions, pulmonary lesions, liver lesions, and concern for peritoneal metastases  HPI: Tyler Barker is a 58 year old male with no significant past medical history.  He presented to the emergency room with back pain, leg numbness, and difficulty walking.  The patient reports that he was in his usual state of health until approximately 1 week ago when he developed worsening back pain, lower extremity numbness, and difficulty walking.  He had multiple imaging studies performed in the emergency room including a CT of the head without contrast which showed an acute infarct in the posterior right parietal lobe without hemorrhage and a small old lacunar infarct in the left cerebellum.  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 with most notable lesions at the left occipital condyle, C5 and C6 vertebral bodies, and right posterior elements of C7 and T1, 9 mm metastatic soft tissue implant involving the subcutaneous fat along the midline of the posterior cervical spine, abnormal right supraclavicular adenopathy,  dominant osseous metastasis involving the right sacrum at the level of the S2 segment, early extraosseous extension into the adjacent right S2 foramen without frank neural impingement, additional osseous metastases involving the right sacral ala and right iliac wing, metastatic soft tissue implants within the right gluteal musculature.  CT of the chest, abdomen, pelvis showed a right upper lobe lung mass which is contiguous with the right hilum and concerning for primary bronchogenic carcinoma, bilateral pulmonary nodularity compatible with metastatic disease, extensive thoracic and abdominal adenopathy compatible with nodal metastases, encasement and narrowing of the SVC and left innominate vein, diffuse liver metastases, signs of peritoneal metastases, osseous metastases in the bony thorax, lumbar spine, and sacrum, skeletal muscle metastasis noted in the left paraspinous musculature.  The patient has been seen by interventional radiology and scheduled to undergo a right supraclavicular lymph node biopsy under ultrasound guidance.   The patient was seen in the emergency room today.  He just returned from interventional radiology after having ultrasound-guided biopsy of his right supraclavicular lymph node.  The patient reports that his legs have been extremely weak.  He reports that he can no longer walk or stand and when he attempts to, his legs just give out.  He reports mid to low back pain and pain radiates around his abdomen up to his chest.  He is not currently having any headaches or dizziness.  He reports shortness of breath with minimal exertion and a productive cough.  He denies hemoptysis.  Denies abdominal pain, nausea, vomiting.  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  He has been in [place], [place] recently while waiting on his truck to be fixed.  He states that he does not really have a home anywhere.  He does have 2 daughters who live  in [name].  They seem to be minimally involved in his care.  He has a remote history of alcohol use.  He currently smokes a pack cigarettes per day.  Medical oncology was asked see the patient make recommendations regarding concern for widespread cancer.   Past Medical History:  Diagnosis Date  . Medical history non-contributory   :  Past Surgical History:  Procedure Laterality Date  . APPENDECTOMY  1979  :  Current Facility-Administered Medications  Medication Dose Route Frequency Provider Last Rate Last Admin  .  stroke: mapping our early stages of recovery book   Does not apply Once Opyd, Ilene Qua, MD      . acetaminophen (TYLENOL) tablet 650 mg  650 mg Oral Q4H PRN Opyd, Ilene Qua, MD       Or  . acetaminophen (TYLENOL) 160 MG/5ML solution 650 mg  650 mg Per Tube Q4H PRN Opyd, Ilene Qua, MD       Or  . acetaminophen (TYLENOL) suppository 650 mg  650 mg Rectal Q4H PRN Opyd, Ilene Qua, MD      . aspirin suppository 300 mg  300 mg Rectal Daily Opyd, Ilene Qua, MD       Or  . aspirin tablet 325 mg  325 mg Oral Daily Opyd, Ilene Qua, MD   325 mg at 09/22/20 1012  . dexamethasone (DECADRON) injection 4 mg  4 mg Intravenous Q12H Shawna Clamp, MD   4 mg at 09/22/20 2330  . enoxaparin (LOVENOX) injection 40 mg  40 mg Subcutaneous Daily Opyd, Ilene Qua, MD   40 mg at 09/22/20 1012  . HYDROmorphone (DILAUDID) injection 0.5-1 mg  0.5-1 mg Intravenous Q4H PRN Opyd, Ilene Qua, MD   1 mg at 09/23/20 0630  . ondansetron (ZOFRAN) injection 4 mg  4 mg Intravenous Q6H PRN Opyd, Ilene Qua, MD   4 mg at 09/22/20 0819  . senna-docusate (Senokot-S) tablet 1 tablet  1 tablet Oral QHS PRN Opyd, Ilene Qua, MD       Current Outpatient Medications  Medication Sig Dispense Refill  . naproxen sodium (ALEVE) 220 MG tablet Take 220 mg by mouth 2 (two) times daily as needed (pain).       No Known Allergies:  Family History  Problem Relation Age of Onset  . Diabetes Maternal Aunt   . Alcoholism  Paternal Grandmother   :  Social History   Socioeconomic History  . Marital status: Widowed    Spouse name: Not on file  . Number of children: Not on file  . Years of education: Not on file  . Highest education level: Not on file  Occupational History  . Occupation: truck Geophysicist/field seismologist  Tobacco Use  . Smoking status: Current Every Day Smoker    Packs/day: 1.00  . Smokeless tobacco: Never Used  Substance and Sexual Activity  . Alcohol use: Not Currently    Comment: quit in 1988 after DUI   . Drug use: Not Currently  . Sexual activity: Not on file  Other Topics Concern  . 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 Lives in [name] where he has two daughters.    Social Determinants of Health   Financial Resource Strain:   . Difficulty of Paying Living Expenses: Not on file  Food Insecurity:   . Worried About Charity fundraiser in the Last Year: Not on file  .  Ran Out of Food in the Last Year: Not on file  Transportation Needs:   . Lack of Transportation (Medical): Not on file  . Lack of Transportation (Non-Medical): Not on file  Physical Activity:   . Days of Exercise per Week: Not on file  . Minutes of Exercise per Session: Not on file  Stress:   . Feeling of Stress : Not on file  Social Connections:   . Frequency of Communication with Friends and Family: Not on file  . Frequency of Social Gatherings with Friends and Family: Not on file  . Attends Religious Services: Not on file  . Active Member of Clubs or Organizations: Not on file  . Attends Archivist Meetings: Not on file  . Marital Status: Not on file  Intimate Partner Violence:   . Fear of Current or Ex-Partner: Not on file  . Emotionally Abused: Not on file  . Physically Abused: Not on file  . Sexually Abused: Not on file  :  Review of Systems: A comprehensive 14 point review of systems was negative except as noted in the HPI.  Exam: Patient Vitals  for the past 24 hrs:  BP Temp Temp src Pulse Resp SpO2  09/23/20 0900 113/70 -- -- 64 18 98 %  09/23/20 0800 109/78 -- -- 68 14 98 %  09/23/20 0700 110/69 -- -- 65 20 96 %  09/23/20 0600 (!) 111/58 -- -- 60 20 95 %  09/23/20 0500 110/62 -- -- (!) 58 14 95 %  09/23/20 0339 -- 98.8 F (37.1 C) Oral -- -- --  09/23/20 0337 106/68 -- -- 71 16 96 %  09/23/20 0300 (!) 107/55 -- -- 69 20 98 %  09/23/20 0200 (!) 90/53 -- -- 77 20 97 %  09/23/20 0100 (!) 95/54 -- -- 71 14 96 %  09/22/20 2332 130/67 98.6 F (37 C) Oral 74 14 98 %  09/22/20 2300 (!) 110/44 -- -- 68 19 97 %  09/22/20 2200 123/76 -- -- 72 17 95 %  09/22/20 2021 112/67 98.4 F (36.9 C) Oral 79 12 97 %  09/22/20 1900 (!) 123/107 -- -- 78 17 97 %  09/22/20 1840 102/70 -- -- 86 14 98 %  09/22/20 1800 105/70 -- -- 84 12 100 %  09/22/20 1700 122/69 -- -- 69 15 97 %  09/22/20 1627 (!) 119/93 -- -- 76 17 98 %  09/22/20 1552 124/65 -- -- 75 16 99 %  09/22/20 1500 113/66 -- -- 75 16 98 %  09/22/20 1430 114/63 -- -- 71 19 95 %  09/22/20 1404 99/88 -- -- 74 18 96 %  09/22/20 1400 99/88 -- -- 74 14 95 %  09/22/20 1353 -- -- -- 70 17 98 %  09/22/20 1352 (!) 120/51 -- -- -- -- --  09/22/20 1230 113/72 -- -- -- 15 --  09/22/20 1138 -- -- -- -- -- 100 %  09/22/20 1135 (!) 111/57 -- -- -- 11 --  09/22/20 1104 -- -- -- -- -- 96 %  09/22/20 1100 (!) 104/43 -- -- -- 20 --  09/22/20 1030 (!) 102/32 -- -- -- 15 --    General: Chronically ill-appearing male, no distress Eyes:  no scleral icterus.   ENT:  There were no oropharyngeal lesions.   Neck was without thyromegaly.   Lymphatics:  Negative cervical, supraclavicular or axillary adenopathy.   Respiratory: lungs were clear bilaterally without wheezing or crackles.   Cardiovascular:  Regular rate  and rhythm, S1/S2, without murmur, rub or gallop.  There was no pedal edema.   GI:  abdomen was soft, flat, nontender, nondistended, without organomegaly.   Musculoskeletal: Strength in the  bilateral upper extremities 5/5, 3/5 in the right lower extremity and 4/5 in the left lower extremity.  Lower extremity sensation intact.  He has diminished sensation in the mid chest wall distally. Skin exam was without echymosis, petichae.   Neuro: Cranial nerves II through XII grossly intact.  Patient was alert and oriented.  Attention was good.   Language was appropriate.  Mood was normal without depression.  Speech was not pressured.  Thought content was not tangential.     Lab Results  Component Value Date   WBC 9.8 09/23/2020   HGB 14.4 09/23/2020   HCT 43.2 09/23/2020   PLT 284 09/23/2020   GLUCOSE 131 (H) 09/23/2020   CHOL 169 09/22/2020   TRIG 201 (H) 09/22/2020   HDL 27 (L) 09/22/2020   LDLCALC 102 (H) 09/22/2020   ALT 22 09/21/2020   AST 29 09/21/2020   NA 134 (L) 09/23/2020   K 4.0 09/23/2020   CL 102 09/23/2020   CREATININE 1.87 (H) 09/23/2020   BUN 20 09/23/2020   CO2 22 09/23/2020    CT Head Wo Contrast  Result Date: 09/21/2020 CLINICAL DATA:  Dizziness and leg weakness for the past few days. EXAM: CT HEAD WITHOUT CONTRAST TECHNIQUE: Contiguous axial images were obtained from the base of the skull through the vertex without intravenous contrast. COMPARISON:  None. FINDINGS: Brain: Cortical and subcortical hypodensity with loss of the normal gray-white matter differentiation in the posterior right parietal lobe, concerning for acute infarct. Small old lacunar infarct in the left cerebellum. No evidence of hemorrhage, hydrocephalus, extra-axial collection or mass lesion/mass effect. Vascular: No hyperdense vessel or unexpected calcification. Skull: Normal. Negative for fracture or focal lesion. Sinuses/Orbits: No acute finding. Other: None. IMPRESSION: 1. Findings concerning for acute infarct in the posterior right parietal lobe. No hemorrhage. 2. Small old lacunar infarct in the left cerebellum. Electronically Signed   By: Titus Dubin M.D.   On: 09/21/2020 17:14   MR  BRAIN W WO CONTRAST  Result Date: 09/22/2020 EXAM: MRI HEAD WITHOUT AND WITH CONTRAST TECHNIQUE: Multiplanar, multiecho pulse sequences of the brain and surrounding structures were obtained without and with intravenous contrast. CONTRAST:  56mL GADAVIST GADOBUTROL 1 MMOL/ML IV SOLN COMPARISON:  Prior CT from earlier the same day. FINDINGS: Brain: Multiple scattered enhancing lesions are seen throughout the brain, consistent with intracranial metastatic disease. These are seen as follows: - dominant and most prominent of these lesions is positioned at the parasagittal right parietal lobe in measures 2.0 x 1.5 x 1.8 cm (series 23, image 35). Associated susceptibility artifact compatible with hemorrhage and/or necrosis. Localized vasogenic edema without significant regional mass effect or midline shift. Several additional subcentimeter lesions are seen as follows: - 4 mm lesion at the subcortical anterior left frontal lobe (series 23, image 37) - punctate lesion at the deep white matter of the right frontal lobe (series 24, image 23). - additional possible 4 mm lesion at the subcortical anterior right frontal lobe (series 24, image 25). - 6 mm rim enhancing lesion at the mid left temporal lobe (series 25, image 21). - 4 mm lesion at the right occipital lobe (series 23, image 23). Approximate 6 lesions are seen in total. Underlying cerebral volume within normal limits. No significant cerebral white matter disease. Small remote lacunar infarcts noted at  the thalami bilaterally. Additional small focus of encephalomalacia without associated enhancement at the peripheral left cerebellum consistent with a chronic ischemic infarct as well (series 15, image 7). 4 mm focus of restricted diffusion at the subcortical right occipital lobe consistent with a small acute ischemic infarct (series 9, image 33). No associated hemorrhage or mass effect. Additional subtle diffusion abnormality involving the cortical gray matter of the  high left frontal lobe also consistent with an acute ischemic infarct (series 9, images 91, 89). w additional subtle 7 mm focus of diffusion abnormality involving the right occipital pole without associated enhancement also suspicious for a small acute ischemic infarct (series 9, image 67). No other evidence for acute or subacute ischemia. Gray-white matter differentiation otherwise maintained. No midline shift or hydrocephalus. No extra-axial fluid collection. Pituitary gland suprasellar region within normal limits. Midline structures intact. Vascular: Major intracranial vascular flow voids are maintained. Skull and upper cervical spine: Craniocervical junction within normal limits. Metastatic lesion involving the left occipital condyle noted (series 13, image 15). No other definite osseous lesions about the calvarium. Scalp soft tissues within normal limits. Sinuses/Orbits: Globes and orbital soft tissues within normal limits. Mild scattered mucosal thickening noted within the ethmoidal air cells. Paranasal sinuses are otherwise largely clear. No significant mastoid effusion. Inner ear structures within normal limits. Other: None. IMPRESSION: 1. Multiple scattered enhancing lesions throughout the brain, consistent with intracranial metastatic disease. The dominant lesion is positioned at the right parietal lobe and measures up to 2 cm. Localized vasogenic edema without significant regional mass effect about this lesion. Remainder of the lesions are subcentimeter in size. 2. Few scattered subcentimeter acute ischemic nonhemorrhagic infarcts involving the left frontal lobe, right periatrial white matter, and right occipital lobe as above. 3. Osseous metastatic lesion involving the left occipital condyle. 4. 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 Findings communicated by telephone at the time of interpretation on 09/21/2020 at 11:55 p.m. to provider Arlean Hopping, who verbally  acknowledged these results. Electronically Signed   By: Jeannine Boga M.D.   On: 09/22/2020 00:24   MR CERVICAL SPINE W WO CONTRAST  Result Date: 09/22/2020 CLINICAL DATA:  Initial evaluation for lower extremity weakness. EXAM: MRI CERVICAL SPINE WITHOUT AND WITH CONTRAST TECHNIQUE: Multiplanar and multiecho pulse sequences of the cervical spine, to include the craniocervical junction and cervicothoracic junction, were obtained without and with intravenous contrast. CONTRAST:  38mL GADAVIST GADOBUTROL 1 MMOL/ML IV SOLN COMPARISON:  None available. FINDINGS: Alignment: Straightening of the normal cervical lordosis. Trace anterolisthesis of C7 on T1, chronic and facet mediated. Vertebrae: Osseous metastatic implant seen involving the left occipital condyle. Additional osseous metastasis involves the posterior aspect of the C5 vertebral body. Metastatic lesion largely replaces the C6 vertebral body. Additional metastases seen involving the right posterior elements of C7 and T1. Additional signal abnormality involving the anterior aspect of C4 could reflect an additional lesion versus reactive degenerative change. No associated pathologic fracture. No significant extra osseous extension of tumor in the cervical spine. Cord: Normal signal and morphology. No intramedullary or epidural tumor. No abnormal enhancement. Posterior Fossa, vertebral arteries, paraspinal tissues: 9 mm metastatic soft tissue implant seen involving the subcutaneous fat along the midline of the posterior cervical spine (series 36, image 5). Additional 1.5 cm soft tissue implant involves the right posterior paraspinous musculature at the level of C7 (series 38, image 33). Smaller 1 cm soft tissue implant involving the right posterior paraspinous musculature at C5-6 (series 38, image 25). Abnormal  right supraclavicular adenopathy consistent with nodal metastatic disease, with the dominant node measuring 2.9 x 1.8 cm (series 38, image 33).  Disc levels: C2-C3: Shallow central disc protrusion minimally indents the ventral thecal sac. Mild left-sided facet hypertrophy. No spinal stenosis. Foramina remain patent. C3-C4: Central disc osteophyte complex indents the ventral thecal sac, partially effacing the ventral CSF. Moderate left-sided facet hypertrophy with bilateral uncovertebral spurring. Resultant mild spinal stenosis without significant cord deformity. Severe left with moderate right C4 foraminal narrowing. C4-C5: Small central disc protrusion indents the ventral thecal sac. No significant stenosis or cord deformity. Superimposed mild left sided facet hypertrophy with uncovertebral spurring. Resultant moderate left worse than right C5 foraminal stenosis. C5-C6: Disc desiccation without disc bulge. Mild uncovertebral hypertrophy on the right. No spinal stenosis. Foramina remain patent. C6-C7: Mild disc bulge with right greater than left uncovertebral spurring. No spinal stenosis. Severe right with moderate left C7 foraminal narrowing. C7-T1: Trace anterolisthesis. Minimal disc bulge with bilateral uncovertebral hypertrophy. Moderate left with mild right facet degeneration. No significant spinal stenosis. Moderate right C8 foraminal stenosis. No significant left foraminal encroachment. IMPRESSION: 1. 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 No associated pathologic fracture or extraosseous extension. 2. 9 mm metastatic soft tissue implant involving the subcutaneous fat along the midline of the posterior cervical spine. Additional 1 cm in 1.5 cm soft tissue implants involving the right posterior paraspinous musculature at the levels of C5-6 and C7. 3. Abnormal right supraclavicular adenopathy, consistent with nodal metastatic disease. 4. Multilevel cervical spondylosis with resultant mild  spinal stenosis at C3-4. Moderate to severe bilateral C4, C5, C7, and right C8 foraminal stenosis as above. Findings communicated by telephone at the time of interpretation on 09/21/2020 at 11:55 p.m. to provider Arlean Hopping, who verbally acknowledged these results. Electronically Signed   By: Jeannine Boga M.D.   On: 09/22/2020 00:42   MR THORACIC SPINE W WO CONTRAST  Result Date: 09/22/2020 CLINICAL DATA:  Initial evaluation for acute lower extremity weakness. EXAM: MRI THORACIC WITHOUT AND WITH CONTRAST TECHNIQUE: Multiplanar and multiecho pulse sequences of the thoracic spine were obtained without and with intravenous contrast. CONTRAST:  57mL GADAVIST GADOBUTROL 1 MMOL/ML IV SOLN COMPARISON:  None available. FINDINGS: MRI THORACIC SPINE FINDINGS Alignment: Mild dextroscoliosis. Alignment otherwise normal with preservation of the normal thoracic kyphosis. No listhesis. Vertebrae: Widespread osseous metastatic disease seen throughout the thoracic spine, with prominent lesion seen involving the posterior elements of T3, as well as the T5, T6, T7, T8, T9, and T12 vertebral bodies. Involvement is greatest at the level of T7 where there is an associated pathologic fracture. Associated height loss of up to approximate 50%. Associated extraosseous extension with enhancing tumor seen extending into the ventral and right epidural space (series 42, image 38). Slight cephalad extension of epidural tumor to the levels of T6 and T8 inferiorly. Superimposed prominence of the dorsal epidural fat. Associated severe spinal stenosis with compression/impingement of the thoracic spinal cord at this level. No appreciable cord signal changes at this time. Tumor also extends into the bilateral T6-7 and T7-8 neural foramina. Extension into the right anterolateral paraspinous soft tissues also noted at the level of T7 (series 39, image 1). There is additional epidural extension of tumor into the ventral epidural space at the  level of T9 with no more than mild spinal stenosis (series 42, image 50).  No other significant epidural tumor. Extraosseous extension of tumor also noted about the T3 spinous process metastasis (series 39, image 8). Abnormal enhancement within the inter spinous region at T7-8 also consistent with extraosseous tumor (series 39, image 8). Cord: Ventral epidural tumor at the levels of T7 and T9 as above. Associated cord impingement without cord signal changes at the level of T7. Signal intensity within the thoracic spinal cord is otherwise normal. Paraspinal and other soft tissues: Paraspinous tumor centered about the level of T7. Additional extraosseous tumor about the spinous processes of T3 and T7. Large right perihilar mass and/or adenopathy partially visualized within the lungs (series 42, image 26). Extensive mediastinal and right supraclavicular adenopathy. Additional scattered bilateral pulmonary nodules concerning for metastatic disease. Few scattered lesions within the liver measuring up to approximately 2 cm concerning for intrahepatic metastases. Disc levels: No significant underlying degenerative disc disease within the thoracic spine. No other high-grade stenosis or impingement. IMPRESSION: 1. 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 2. Associated pathologic fracture of T7 with up to 50% height loss. Extra osseous extension with epidural tumor at this level results in severe spinal stenosis and compression/impingement of the thoracic spinal cord. No cord signal changes at this time. 3. Additional mild epidural extension of tumor into the ventral epidural space at the level of T9 with no more than mild spinal stenosis. 4. Large right perihilar mass and/or adenopathy, raising the possibility for a primary lung malignancy. Additional extensive mediastinal and supraclavicular adenopathy with widespread pulmonary nodules concerning for metastatic disease. Further  evaluation with dedicated cross-sectional imaging of the chest recommended. 5. Few scattered lesions within the liver, concerning for intrahepatic metastases. Findings communicated by telephone at the time of interpretation on 09/21/2020 at 11:55 p.m. to provider Arlean Hopping, who verbally acknowledged these results. Electronically Signed   By: Jeannine Boga M.D.   On: 09/22/2020 01:02   MR Lumbar Spine W Wo Contrast  Result Date: 09/22/2020 CLINICAL DATA:  Initial evaluation for acute lower extremity weakness. EXAM: MRI LUMBAR SPINE WITHOUT AND WITH CONTRAST TECHNIQUE: Multiplanar and multiecho pulse sequences of the lumbar spine were obtained without and with intravenous contrast. CONTRAST:  39mL GADAVIST GADOBUTROL 1 MMOL/ML IV SOLN COMPARISON:  None available. FINDINGS: Segmentation:  Standard. Alignment:  Physiologic. Vertebrae: Scattered osseous metastatic disease seen throughout the lumbar spine and sacrum. Most prominent involvement seen at the level of L3 which is largely replaced by a metastatic lesion. Associated pathologic fracture with up to 25% height loss without bony retropulsion. Additional metastatic lesion involves the left transverse process of L2 (series 11, image 14). Few additional lesions noted about the sacrum. No significant extra osseous extension or epidural tumor. Conus medullaris and cauda equina: Conus extends to the T12-L1 level. Conus and cauda equina appear normal. Paraspinal and other soft tissues: 4.6 cm metastatic soft tissue implants seen within the left posterior paraspinous musculature at the level of L4 (series 32, image 14). Few additional subcentimeter nodular densities about the retroperitoneal fat suspicious for small metastases as well (series 35, image 5 on the left, image 14 on the right). Visualized visceral structures otherwise unremarkable. Disc levels: L1-2: Normal interspace. Moderate left with mild right facet hypertrophy. No stenosis. L2-3: Mild disc bulge  with disc desiccation. Moderate right worse than left facet hypertrophy. No significant spinal stenosis. Mild right foraminal narrowing. L3-4: Mild diffuse disc bulge with disc desiccation. Superimposed right foraminal to extraforaminal disc protrusion contacts the exiting right L3 nerve root (series  13, image 23). Severe right with moderate left facet hypertrophy. Resultant mild narrowing of the lateral recesses bilaterally. Mild bilateral L3 foraminal stenosis. L4-5: Mild disc bulge. Superimposed shallow right foraminal to extraforaminal disc protrusion with annular fissure, contacting the exiting right L4 nerve root (series 13, image 29). Moderate left worse than right facet hypertrophy. Resultant mild canal with bilateral subarticular stenosis. Mild bilateral L4 foraminal narrowing. L5-S1: Mild disc bulge. Associated annular fissure at the level of the right neural foramen. Moderate right with mild left facet hypertrophy. No significant spinal stenosis. Mild right foraminal narrowing. IMPRESSION: 1. Scattered osseous metastatic disease throughout the lumbar spine and sacrum as above. Associated pathologic fracture at the level of L3 with up to 25% height loss without bony retropulsion. No epidural or intracanalicular tumor. 2. 4.6 cm metastatic soft tissue implants within the left posterior paraspinous musculature at the level of L4. 3. Right foraminal to extraforaminal disc protrusions at L3-4 and L4-5, contacting and potentially irritating the exiting right L3 and L4 nerve roots respectively. 4. Underlying moderate to advanced multilevel facet hypertrophy as detailed above. Electronically Signed   By: Jeannine Boga M.D.   On: 09/22/2020 01:12   MR SACRUM SI JOINTS W WO CONTRAST  Result Date: 09/22/2020 CLINICAL DATA:  Initial evaluation for acute lower extremity weakness. EXAM: MRI SACRUM WITHOUT AND WITH CONTRAST TECHNIQUE: Multiplanar multi-sequence MR imaging of the sacrum was performed with and  without IV contrast. 9 cc of Gadavist was administered. COMPARISON:  None available. FINDINGS: Urinary Tract: Partially distended bladder moderately distended without abnormality. Bowel: Visualized bowel within normal limits without evidence for obstruction or inflammation. Vascular/Lymphatic: Normal vascular flow void seen throughout the visualized pelvis. No appreciable adenopathy. Reproductive:  Unremarkable. Other: Metastatic soft tissue implant measuring approximately 1.8 cm partially visualize within the right gluteal musculature (series 40, image 14). Additional 2 cm implant seen more superiorly within the right gluteal musculature (series 40, image 4). Musculoskeletal: Dominant metastases seen involving the right sacrum at the level of the S2 segment. Lesion measures approximately 3.5 x 4.3 x 4.1 cm (series 40, image 14). Early extraosseous extension of tumor into the adjacent right S2 foramen without frank neural impingement (series 41, image 13). Additional 2.2 cm metastasis noted more superiorly and anteriorly at the right sacral ala (series 40, image 16). 4.4 cm osseous metastasis involves the right iliac wing (series 40, image 18). No associated pathologic fracture. No other significant extra osseous extension of tumor. IMPRESSION: 1. 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 Early extraosseous extension into the adjacent right S2 foramen without frank neural impingement. 2. Additional osseous metastases involving the right sacral ala and right iliac wing as above. No pathologic fracture or other complication. 3. Metastatic soft tissue implants within the right gluteal musculature as above. Electronically Signed   By: Jeannine Boga M.D.   On: 09/22/2020 01:19   CT CHEST ABDOMEN PELVIS W CONTRAST  Result Date: 09/22/2020 CLINICAL DATA:  Evaluate for neoplasm. EXAM: CT CHEST, ABDOMEN, AND PELVIS WITH CONTRAST TECHNIQUE: Multidetector CT imaging of  the chest, abdomen and pelvis was performed following the standard protocol during bolus administration of intravenous contrast. CONTRAST:  4mL OMNIPAQUE IOHEXOL 300 MG/ML  SOLN COMPARISON:  None FINDINGS: CT CHEST FINDINGS Cardiovascular: The heart size appears normal. No pericardial effusion identified. Aortic atherosclerosis. Mediastinum/Nodes: Normal appearance of the thyroid gland. The trachea appears patent and is midline. Normal appearance of the esophagus. Extensive thoracic adenopathy identified compatible with nodal  metastases. Right supraclavicular lymph node measures 1.6 cm, image 5/3 Nodal mass within the right paratracheal region measures 7.3 x 5.2 by 6.8 cm, image 16/3. This encases and narrows the SVC and left innominate vein. Pre-vascular lymph node anterior to the ascending aorta measures 2.7 cm, image 22/3. Subcarinal lymph node measures 2.7 cm, image 29/3. Right perihilar nodal mass measures 3.0 by 3.5 by 4.2 cm, image 27/3 and image 78/5. Left AP window lymph node measures 1.3 cm, image 21/3. Prominent left hilar lymph nodes are also noted which measure up to 1.1 cm. Lungs/Pleura: Centrilobular and paraseptal emphysema. No pleural effusion identified. Within the posteromedial right upper lobe there is a lung mass which is contiguous with the right hilum. This measures 3.8 x 2.8 by 3.6 cm, image 50/4 and image 92/5. Innumerable lung nodules are scattered throughout both lungs compatible with diffuse metastases. Index nodule in the superior segment of left lower lobe measures 1.1 cm, image 62/4 Subpleural nodule in the posteromedial right lower lobe measures 1.4 cm, image 80/4. Central left upper lobe lung nodule measures 0.8 cm, image 65/4. Musculoskeletal: Scattered lucent bone lesions are identified within the bony thorax. These are better seen on the MRI from 09/21/2020. Pathologic compression fracture noted at T7 with loss of 50% of the vertebral body height. Again seen are signs of extra  osseous extension with epidural tumor at this level. CT ABDOMEN PELVIS FINDINGS Hepatobiliary: Diffuse liver metastases identified. Index lesion within the subcapsular aspect of segment 7 measures 2.1 cm, image 44/3. Index lesion within segment 4 B measures 1.8 cm, image 47/3. Caudate lobe lesion measures 1.8 cm, image 50/3. Gallbladder is unremarkable.  No biliary ductal dilatation. Pancreas: Unremarkable. No pancreatic ductal dilatation or surrounding inflammatory changes. Spleen: Normal in size without focal abnormality. Adrenals/Urinary Tract: Normal appearance of the adrenal glands. Mild bilateral renal cortical volume loss. No suspicious mass or hydronephrosis identified. Urinary bladder is unremarkable. Stomach/Bowel: There is moderate distension of the stomach. No bowel wall thickening, inflammation, or distension. Vascular/Lymphatic: Aortic atherosclerosis. No aneurysm. No abdominopelvic adenopathy. Reproductive: Prostate is unremarkable. Other: No ascites or focal fluid collections. Signs of peritoneal metastases identified. Index lesion adjacent to the transverse colon within the left upper quadrant measures 2.5 cm, image 53/3. Musculoskeletal: Osseous metastases within the lumbar spine and sacrum are better demonstrated on recent MRI from 07/22/2020. The pathologic fracture involving the L3 vertebral body is again noted and appears unchanged, image 88/6. the left paraspinous intra muscular metastasis is noted measuring 4 cm, image 92/3. IMPRESSION: 1. Right upper lobe lung mass is identified which is contiguous with the right hilum and is concerning for primary bronchogenic carcinoma. 2. Bilateral pulmonary nodularity compatible with metastatic disease. 3. Extensive thoracic and abdominal adenopathy compatible with nodal metastases. There is encasement and narrowing of the superior vena cava and left innominate vein. 4. Diffuse liver metastases. 5. Signs of peritoneal metastases. 6. Osseous metastases  within the bony thorax, lumbar spine and sacrum are better demonstrated on the recent MRI from 09/21/2020. Pathologic fractures are noted involving the T7 and L3 vertebra. 7. 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 8. Emphysema and aortic atherosclerosis. Aortic Atherosclerosis (ICD10-I70.0) and Emphysema (ICD10-J43.9). Electronically Signed   By: Kerby Moors M.D.   On: 09/22/2020 09:30   ECHOCARDIOGRAM COMPLETE  Result Date: 09/22/2020    ECHOCARDIOGRAM REPORT   Patient Name:   Tyler Barker Date of Exam: 09/22/2020 Medical Rec #:  [id number]     Height:  70.0 in Accession #:    [id number]    Weight:       200.0 lb Date of Birth:  10/18/1961    BSA:          2.087 m Patient Age:    62 years      BP:           113/72 mmHg Patient Gender: M             HR:           68 bpm. Exam Location:  Inpatient Procedure: 2D Echo Indications:    stroke 434.91  History:        Patient has no prior history of Echocardiogram examinations.                 Risk Factors:Current Smoker.  Sonographer:    Jannett Celestine RDCS (AE) Referring Phys: [id number] TIMOTHY S OPYD  Sonographer Comments: Image acquisition challenging due to respiratory motion. see comments regarding exam. off axis windows IMPRESSIONS  1. Left ventricular ejection fraction, by estimation, is 60 to 65%. The left ventricle has normal function. The left ventricle has no regional wall motion abnormalities. There is mild left ventricular hypertrophy. Left ventricular diastolic parameters were normal.  2. Right ventricular systolic function is normal. The right ventricular size is normal.  3. The mitral valve is normal in structure. No evidence of mitral valve regurgitation. No evidence of mitral stenosis.  4. The aortic valve has an indeterminant number of cusps. Aortic valve regurgitation is not visualized. No aortic stenosis is present.  5. The inferior vena cava is normal in size with greater than 50% respiratory variability,  suggesting right atrial pressure of 3 mmHg. FINDINGS  Left Ventricle: Left ventricular ejection fraction, by estimation, is 60 to 65%. The left ventricle has normal function. The left ventricle has no regional wall motion abnormalities. The left ventricular internal cavity size was normal in size. There is  mild left ventricular hypertrophy. Left ventricular diastolic parameters were normal. Right Ventricle: The right ventricular size is normal. No increase in right ventricular wall thickness. Right ventricular systolic function is normal. Left Atrium: Left atrial size was normal in size. Right Atrium: Right atrial size was normal in size. Pericardium: There is no evidence of pericardial effusion. Mitral Valve: The mitral valve is normal in structure. No evidence of mitral valve regurgitation. No evidence of mitral valve stenosis. Tricuspid Valve: The tricuspid valve is normal in structure. Tricuspid valve regurgitation is not demonstrated. No evidence of tricuspid stenosis. Aortic Valve: The aortic valve has an indeterminant number of cusps. Aortic valve regurgitation is not visualized. No aortic stenosis is present. Aortic valve mean gradient measures 3.4 mmHg. Aortic valve peak gradient measures 6.9 mmHg. Aortic valve area, by VTI measures 4.15 cm. Pulmonic Valve: The pulmonic valve was not well visualized. Pulmonic valve regurgitation is not visualized. No evidence of pulmonic stenosis. Aorta: The aortic root is normal in size and structure. Pulmonary Artery: Indeterminant PASP, inadequate TR jet. Venous: The inferior vena cava was not well visualized. The inferior vena cava is normal in size with greater than 50% respiratory variability, suggesting right atrial pressure of 3 mmHg. IAS/Shunts: The interatrial septum was not well visualized.  LEFT VENTRICLE PLAX 2D LVIDd:         3.80 cm  Diastology LVIDs:         2.40 cm  LV e' medial:    9.68 cm/s LV PW:  1.10 cm  LV E/e' medial:  6.6 LV IVS:         1.20 cm  LV e' lateral:   11.90 cm/s LVOT diam:     2.20 cm  LV E/e' lateral: 5.4 LV SV:         81 LV SV Index:   39 LVOT Area:     3.80 cm  RIGHT VENTRICLE RV S prime:     14.60 cm/s TAPSE (M-mode): 2.2 cm LEFT ATRIUM         Index LA diam:    3.60 cm 1.72 cm/m  AORTIC VALVE AV Area (Vmax):    3.70 cm AV Area (Vmean):   3.44 cm AV Area (VTI):     4.15 cm AV Vmax:           131.67 cm/s AV Vmean:          87.607 cm/s AV VTI:            0.194 m AV Peak Grad:      6.9 mmHg AV Mean Grad:      3.4 mmHg LVOT Vmax:         128.00 cm/s LVOT Vmean:        79.200 cm/s LVOT VTI:          0.212 m LVOT/AV VTI ratio: 1.09  AORTA Ao Root diam: 3.30 cm MITRAL VALVE MV Area (PHT): 3.46 cm    SHUNTS MV Decel Time: 219 msec    Systemic VTI:  0.21 m MV E velocity: 64.30 cm/s  Systemic Diam: 2.20 cm MV A velocity: 68.60 cm/s MV E/A ratio:  0.94 Carlyle Dolly MD Electronically signed by Carlyle Dolly MD Signature Date/Time: 09/22/2020/1:41:07 PM    Final    VAS US CAROTID (at [facility] and [facility] only)  Result Date: 09/22/2020 Carotid Arterial Duplex Study Indications:       CVA and Back pain, difficulty walking. Risk Factors:      Current smoker. 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 Comparison Study:  No prior study Performing Technologist: Sharion Dove RVS  Examination Guidelines: A complete evaluation includes B-mode imaging, spectral Doppler, color Doppler, and power Doppler as needed of all accessible portions of each vessel. Bilateral testing is considered an integral part of a complete examination. Limited examinations for reoccurring indications may be performed as noted.  Right Carotid Findings: +----------+--------+--------+--------+------------------+------------------+           PSV cm/sEDV cm/sStenosisPlaque DescriptionComments           +----------+--------+--------+--------+------------------+------------------+ CCA Prox  105     26                                 intimal thickening +----------+--------+--------+--------+------------------+------------------+ CCA Distal106     36                                intimal thickening +----------+--------+--------+--------+------------------+------------------+ ICA Prox  82      27              heterogenous                         +----------+--------+--------+--------+------------------+------------------+ ICA Distal95  34                                                   +----------+--------+--------+--------+------------------+------------------+ ECA       157     31                                                   +----------+--------+--------+--------+------------------+------------------+ +----------+--------+-------+--------+-------------------+           PSV cm/sEDV cmsDescribeArm Pressure (mmHG) +----------+--------+-------+--------+-------------------+ [id number]                                        +----------+--------+-------+--------+-------------------+ +---------+--------+--+--------+--+ VertebralPSV cm/s27EDV cm/s16 +---------+--------+--+--------+--+  Left Carotid Findings: +----------+--------+--------+--------+------------------+------------------+           PSV cm/sEDV cm/sStenosisPlaque DescriptionComments           +----------+--------+--------+--------+------------------+------------------+ CCA Prox  104     34                                intimal thickening +----------+--------+--------+--------+------------------+------------------+ CCA Distal116     37                                intimal thickening +----------+--------+--------+--------+------------------+------------------+ ICA Prox  105     38              heterogenous                         +----------+--------+--------+--------+------------------+------------------+ ICA Distal105     29                                                    +----------+--------+--------+--------+------------------+------------------+ ECA       121     21                                                   +----------+--------+--------+--------+------------------+------------------+ +----------+--------+--------+--------+-------------------+           PSV cm/sEDV cm/sDescribeArm Pressure (mmHG) +----------+--------+--------+--------+-------------------+ [id number]                                          +----------+--------+--------+--------+-------------------+ +---------+--------+--+--------+--+ VertebralPSV cm/s52EDV cm/s19 +---------+--------+--+--------+--+   Summary: Right Carotid: The extracranial vessels were near-normal with only minimal wall                thickening or plaque. Left Carotid: The extracranial vessels were near-normal with only minimal wall               thickening or plaque. Vertebrals:  Bilateral vertebral arteries demonstrate antegrade flow. Subclavians: Normal flow  hemodynamics were seen in bilateral subclavian              arteries. *See table(s) above for measurements and observations.     Preliminary      CT Head Wo Contrast  Result Date: 09/21/2020 CLINICAL DATA:  Dizziness and leg weakness for the past few days. EXAM: CT HEAD WITHOUT CONTRAST TECHNIQUE: Contiguous axial images were obtained from the base of the skull through the vertex without intravenous contrast. COMPARISON:  None. FINDINGS: Brain: Cortical and subcortical hypodensity with loss of the normal gray-white matter differentiation in the posterior right parietal lobe, concerning for acute infarct. Small old lacunar infarct in the left cerebellum. No evidence of hemorrhage, hydrocephalus, extra-axial collection or mass lesion/mass effect. Vascular: No hyperdense vessel or unexpected calcification. Skull: Normal. Negative for fracture or focal lesion. Sinuses/Orbits: No acute finding. Other: None. IMPRESSION: 1. Findings concerning for  acute infarct in the posterior right parietal lobe. No hemorrhage. 2. Small old lacunar infarct in the left cerebellum. Electronically Signed   By: Titus Dubin M.D.   On: 09/21/2020 17:14   MR BRAIN W WO CONTRAST  Result Date: 09/22/2020 EXAM: MRI HEAD WITHOUT AND WITH CONTRAST TECHNIQUE: Multiplanar, multiecho pulse sequences of the brain and surrounding structures were obtained without and with intravenous contrast. CONTRAST:  57mL GADAVIST GADOBUTROL 1 MMOL/ML IV SOLN COMPARISON:  Prior CT from earlier the same day. FINDINGS: Brain: Multiple scattered enhancing lesions are seen throughout the brain, consistent with intracranial metastatic disease. These are seen as follows: - dominant and most prominent of these lesions is positioned at the parasagittal right parietal lobe in measures 2.0 x 1.5 x 1.8 cm (series 23, image 35). Associated susceptibility artifact compatible with hemorrhage and/or necrosis. Localized vasogenic edema without significant regional mass effect or midline shift. Several additional subcentimeter lesions are seen as follows: - 4 mm lesion at the subcortical anterior left frontal lobe (series 23, image 37) - punctate lesion at the deep white matter of the right frontal lobe (series 24, image 23). - additional possible 4 mm lesion at the subcortical anterior right frontal lobe (series 24, image 25). - 6 mm rim enhancing lesion at the mid left temporal lobe (series 25, image 21). - 4 mm lesion at the right occipital lobe (series 23, image 23). Approximate 6 lesions are seen in total. Underlying cerebral volume within normal limits. No significant cerebral white matter disease. Small remote lacunar infarcts noted at the thalami bilaterally. Additional small focus of encephalomalacia without associated enhancement at the peripheral left cerebellum consistent with a chronic ischemic infarct as well (series 15, image 7). 4 mm focus of restricted diffusion at the subcortical right occipital  lobe consistent with a small acute ischemic infarct (series 9, image 33). No associated hemorrhage or mass effect. Additional subtle diffusion abnormality involving the cortical gray matter of the high left frontal lobe also consistent with an acute ischemic infarct (series 9, images 91, 89). w additional subtle 7 mm focus of diffusion abnormality involving the right occipital pole without associated enhancement also suspicious for a small acute ischemic infarct (series 9, image 67). No other evidence for acute or subacute ischemia. Gray-white matter differentiation otherwise maintained. No midline shift or hydrocephalus. No extra-axial fluid collection. Pituitary gland suprasellar region within normal limits. Midline structures intact. Vascular: Major intracranial vascular flow voids are maintained. Skull and upper cervical spine: Craniocervical junction within normal limits. Metastatic lesion involving the left occipital condyle noted (series 13, image 15). No other definite osseous lesions  about the calvarium. Scalp soft tissues within normal limits. Sinuses/Orbits: Globes and orbital soft tissues within normal limits. Mild scattered mucosal thickening noted within the ethmoidal air cells. Paranasal sinuses are otherwise largely clear. No significant mastoid effusion. Inner ear structures within normal limits. Other: None. IMPRESSION: 1. Multiple scattered enhancing lesions throughout the brain, consistent with intracranial metastatic disease. The dominant lesion is positioned at the right parietal lobe and measures up to 2 cm. Localized vasogenic edema without significant regional mass effect about this lesion. Remainder of the lesions are subcentimeter in size. 2. Few scattered subcentimeter acute ischemic nonhemorrhagic infarcts involving the left frontal lobe, right periatrial white matter, and right occipital lobe as above. 3. Osseous metastatic lesion involving the left occipital condyle. 4. 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 Findings communicated by telephone at the time of interpretation on 09/21/2020 at 11:55 p.m. to provider Arlean Hopping, who verbally acknowledged these results. Electronically Signed   By: Jeannine Boga M.D.   On: 09/22/2020 00:24   MR CERVICAL SPINE W WO CONTRAST  Result Date: 09/22/2020 CLINICAL DATA:  Initial evaluation for lower extremity weakness. EXAM: MRI CERVICAL SPINE WITHOUT AND WITH CONTRAST TECHNIQUE: Multiplanar and multiecho pulse sequences of the cervical spine, to include the craniocervical junction and cervicothoracic junction, were obtained without and with intravenous contrast. CONTRAST:  43mL GADAVIST GADOBUTROL 1 MMOL/ML IV SOLN COMPARISON:  None available. FINDINGS: Alignment: Straightening of the normal cervical lordosis. Trace anterolisthesis of C7 on T1, chronic and facet mediated. Vertebrae: Osseous metastatic implant seen involving the left occipital condyle. Additional osseous metastasis involves the posterior aspect of the C5 vertebral body. Metastatic lesion largely replaces the C6 vertebral body. Additional metastases seen involving the right posterior elements of C7 and T1. Additional signal abnormality involving the anterior aspect of C4 could reflect an additional lesion versus reactive degenerative change. No associated pathologic fracture. No significant extra osseous extension of tumor in the cervical spine. Cord: Normal signal and morphology. No intramedullary or epidural tumor. No abnormal enhancement. Posterior Fossa, vertebral arteries, paraspinal tissues: 9 mm metastatic soft tissue implant seen involving the subcutaneous fat along the midline of the posterior cervical spine (series 36, image 5). Additional 1.5 cm soft tissue implant involves the right posterior paraspinous musculature at the level of C7 (series 38, image 33). Smaller 1 cm soft tissue implant involving the right posterior paraspinous  musculature at C5-6 (series 38, image 25). Abnormal right supraclavicular adenopathy consistent with nodal metastatic disease, with the dominant node measuring 2.9 x 1.8 cm (series 38, image 33). Disc levels: C2-C3: Shallow central disc protrusion minimally indents the ventral thecal sac. Mild left-sided facet hypertrophy. No spinal stenosis. Foramina remain patent. C3-C4: Central disc osteophyte complex indents the ventral thecal sac, partially effacing the ventral CSF. Moderate left-sided facet hypertrophy with bilateral uncovertebral spurring. Resultant mild spinal stenosis without significant cord deformity. Severe left with moderate right C4 foraminal narrowing. C4-C5: Small central disc protrusion indents the ventral thecal sac. No significant stenosis or cord deformity. Superimposed mild left sided facet hypertrophy with uncovertebral spurring. Resultant moderate left worse than right C5 foraminal stenosis. C5-C6: Disc desiccation without disc bulge. Mild uncovertebral hypertrophy on the right. No spinal stenosis. Foramina remain patent. C6-C7: Mild disc bulge with right greater than left uncovertebral spurring. No spinal stenosis. Severe right with moderate left C7 foraminal narrowing. C7-T1: Trace anterolisthesis. Minimal disc bulge with bilateral uncovertebral hypertrophy. Moderate left with mild right facet degeneration. No significant spinal stenosis. Moderate right  C8 foraminal stenosis. No significant left foraminal encroachment. IMPRESSION: 1. 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 No associated pathologic fracture or extraosseous extension. 2. 9 mm metastatic soft tissue implant involving the subcutaneous fat along the midline of the posterior cervical spine. Additional 1 cm in 1.5 cm soft tissue implants involving the right posterior  paraspinous musculature at the levels of C5-6 and C7. 3. Abnormal right supraclavicular adenopathy, consistent with nodal metastatic disease. 4. Multilevel cervical spondylosis with resultant mild spinal stenosis at C3-4. Moderate to severe bilateral C4, C5, C7, and right C8 foraminal stenosis as above. Findings communicated by telephone at the time of interpretation on 09/21/2020 at 11:55 p.m. to provider Arlean Hopping, who verbally acknowledged these results. Electronically Signed   By: Jeannine Boga M.D.   On: 09/22/2020 00:42   MR THORACIC SPINE W WO CONTRAST  Result Date: 09/22/2020 CLINICAL DATA:  Initial evaluation for acute lower extremity weakness. EXAM: MRI THORACIC WITHOUT AND WITH CONTRAST TECHNIQUE: Multiplanar and multiecho pulse sequences of the thoracic spine were obtained without and with intravenous contrast. CONTRAST:  58mL GADAVIST GADOBUTROL 1 MMOL/ML IV SOLN COMPARISON:  None available. FINDINGS: MRI THORACIC SPINE FINDINGS Alignment: Mild dextroscoliosis. Alignment otherwise normal with preservation of the normal thoracic kyphosis. No listhesis. Vertebrae: Widespread osseous metastatic disease seen throughout the thoracic spine, with prominent lesion seen involving the posterior elements of T3, as well as the T5, T6, T7, T8, T9, and T12 vertebral bodies. Involvement is greatest at the level of T7 where there is an associated pathologic fracture. Associated height loss of up to approximate 50%. Associated extraosseous extension with enhancing tumor seen extending into the ventral and right epidural space (series 42, image 38). Slight cephalad extension of epidural tumor to the levels of T6 and T8 inferiorly. Superimposed prominence of the dorsal epidural fat. Associated severe spinal stenosis with compression/impingement of the thoracic spinal cord at this level. No appreciable cord signal changes at this time. Tumor also extends into the bilateral T6-7 and T7-8 neural foramina. Extension  into the right anterolateral paraspinous soft tissues also noted at the level of T7 (series 39, image 1). There is additional epidural extension of tumor into the ventral epidural space at the level of T9 with no more than mild spinal stenosis (series 42, image 50). No other significant epidural tumor. Extraosseous extension of tumor also noted about the T3 spinous process metastasis (series 39, image 8). Abnormal enhancement within the inter spinous region at T7-8 also consistent with extraosseous tumor (series 39, image 8). Cord: Ventral epidural tumor at the levels of T7 and T9 as above. Associated cord impingement without cord signal changes at the level of T7. Signal intensity within the thoracic spinal cord is otherwise normal. Paraspinal and other soft tissues: Paraspinous tumor centered about the level of T7. Additional extraosseous tumor about the spinous processes of T3 and T7. Large right perihilar mass and/or adenopathy partially visualized within the lungs (series 42, image 26). Extensive mediastinal and right supraclavicular adenopathy. Additional scattered bilateral pulmonary nodules concerning for metastatic disease. Few scattered lesions within the liver measuring up to approximately 2 cm concerning for intrahepatic metastases. Disc levels: No significant underlying degenerative disc disease within the thoracic spine. No other high-grade stenosis or impingement. IMPRESSION: 1. 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 2. Associated pathologic fracture of T7  with up to 50% height loss. Extra osseous extension with epidural tumor at this level results in severe spinal stenosis and compression/impingement of the thoracic spinal cord. No cord signal changes at this time. 3. Additional mild epidural extension of tumor into the ventral epidural space at the level of T9 with no more than mild spinal stenosis. 4. Large right perihilar mass and/or adenopathy,  raising the possibility for a primary lung malignancy. Additional extensive mediastinal and supraclavicular adenopathy with widespread pulmonary nodules concerning for metastatic disease. Further evaluation with dedicated cross-sectional imaging of the chest recommended. 5. Few scattered lesions within the liver, concerning for intrahepatic metastases. Findings communicated by telephone at the time of interpretation on 09/21/2020 at 11:55 p.m. to provider Arlean Hopping, who verbally acknowledged these results. Electronically Signed   By: Jeannine Boga M.D.   On: 09/22/2020 01:02   MR Lumbar Spine W Wo Contrast  Result Date: 09/22/2020 CLINICAL DATA:  Initial evaluation for acute lower extremity weakness. EXAM: MRI LUMBAR SPINE WITHOUT AND WITH CONTRAST TECHNIQUE: Multiplanar and multiecho pulse sequences of the lumbar spine were obtained without and with intravenous contrast. CONTRAST:  31mL GADAVIST GADOBUTROL 1 MMOL/ML IV SOLN COMPARISON:  None available. FINDINGS: Segmentation:  Standard. Alignment:  Physiologic. Vertebrae: Scattered osseous metastatic disease seen throughout the lumbar spine and sacrum. Most prominent involvement seen at the level of L3 which is largely replaced by a metastatic lesion. Associated pathologic fracture with up to 25% height loss without bony retropulsion. Additional metastatic lesion involves the left transverse process of L2 (series 11, image 14). Few additional lesions noted about the sacrum. No significant extra osseous extension or epidural tumor. Conus medullaris and cauda equina: Conus extends to the T12-L1 level. Conus and cauda equina appear normal. Paraspinal and other soft tissues: 4.6 cm metastatic soft tissue implants seen within the left posterior paraspinous musculature at the level of L4 (series 32, image 14). Few additional subcentimeter nodular densities about the retroperitoneal fat suspicious for small metastases as well (series 35, image 5 on the left,  image 14 on the right). Visualized visceral structures otherwise unremarkable. Disc levels: L1-2: Normal interspace. Moderate left with mild right facet hypertrophy. No stenosis. L2-3: Mild disc bulge with disc desiccation. Moderate right worse than left facet hypertrophy. No significant spinal stenosis. Mild right foraminal narrowing. L3-4: Mild diffuse disc bulge with disc desiccation. Superimposed right foraminal to extraforaminal disc protrusion contacts the exiting right L3 nerve root (series 13, image 23). Severe right with moderate left facet hypertrophy. Resultant mild narrowing of the lateral recesses bilaterally. Mild bilateral L3 foraminal stenosis. L4-5: Mild disc bulge. Superimposed shallow right foraminal to extraforaminal disc protrusion with annular fissure, contacting the exiting right L4 nerve root (series 13, image 29). Moderate left worse than right facet hypertrophy. Resultant mild canal with bilateral subarticular stenosis. Mild bilateral L4 foraminal narrowing. L5-S1: Mild disc bulge. Associated annular fissure at the level of the right neural foramen. Moderate right with mild left facet hypertrophy. No significant spinal stenosis. Mild right foraminal narrowing. IMPRESSION: 1. Scattered osseous metastatic disease throughout the lumbar spine and sacrum as above. Associated pathologic fracture at the level of L3 with up to 25% height loss without bony retropulsion. No epidural or intracanalicular tumor. 2. 4.6 cm metastatic soft tissue implants within the left posterior paraspinous musculature at the level of L4. 3. Right foraminal to extraforaminal disc protrusions at L3-4 and L4-5, contacting and potentially irritating the exiting right L3 and L4 nerve roots respectively. 4. Underlying moderate to advanced  multilevel facet hypertrophy as detailed above. Electronically Signed   By: Jeannine Boga M.D.   On: 09/22/2020 01:12   MR SACRUM SI JOINTS W WO CONTRAST  Result Date:  09/22/2020 CLINICAL DATA:  Initial evaluation for acute lower extremity weakness. EXAM: MRI SACRUM WITHOUT AND WITH CONTRAST TECHNIQUE: Multiplanar multi-sequence MR imaging of the sacrum was performed with and without IV contrast. 9 cc of Gadavist was administered. COMPARISON:  None available. FINDINGS: Urinary Tract: Partially distended bladder moderately distended without abnormality. Bowel: Visualized bowel within normal limits without evidence for obstruction or inflammation. Vascular/Lymphatic: Normal vascular flow void seen throughout the visualized pelvis. No appreciable adenopathy. Reproductive:  Unremarkable. Other: Metastatic soft tissue implant measuring approximately 1.8 cm partially visualize within the right gluteal musculature (series 40, image 14). Additional 2 cm implant seen more superiorly within the right gluteal musculature (series 40, image 4). Musculoskeletal: Dominant metastases seen involving the right sacrum at the level of the S2 segment. Lesion measures approximately 3.5 x 4.3 x 4.1 cm (series 40, image 14). Early extraosseous extension of tumor into the adjacent right S2 foramen without frank neural impingement (series 41, image 13). Additional 2.2 cm metastasis noted more superiorly and anteriorly at the right sacral ala (series 40, image 16). 4.4 cm osseous metastasis involves the right iliac wing (series 40, image 18). No associated pathologic fracture. No other significant extra osseous extension of tumor. IMPRESSION: 1. 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 Early extraosseous extension into the adjacent right S2 foramen without frank neural impingement. 2. Additional osseous metastases involving the right sacral ala and right iliac wing as above. No pathologic fracture or other complication. 3. Metastatic soft tissue implants within the right gluteal musculature as above. Electronically Signed   By: Jeannine Boga M.D.   On:  09/22/2020 01:19   CT CHEST ABDOMEN PELVIS W CONTRAST  Result Date: 09/22/2020 CLINICAL DATA:  Evaluate for neoplasm. EXAM: CT CHEST, ABDOMEN, AND PELVIS WITH CONTRAST TECHNIQUE: Multidetector CT imaging of the chest, abdomen and pelvis was performed following the standard protocol during bolus administration of intravenous contrast. CONTRAST:  32mL OMNIPAQUE IOHEXOL 300 MG/ML  SOLN COMPARISON:  None FINDINGS: CT CHEST FINDINGS Cardiovascular: The heart size appears normal. No pericardial effusion identified. Aortic atherosclerosis. Mediastinum/Nodes: Normal appearance of the thyroid gland. The trachea appears patent and is midline. Normal appearance of the esophagus. Extensive thoracic adenopathy identified compatible with nodal metastases. Right supraclavicular lymph node measures 1.6 cm, image 5/3 Nodal mass within the right paratracheal region measures 7.3 x 5.2 by 6.8 cm, image 16/3. This encases and narrows the SVC and left innominate vein. Pre-vascular lymph node anterior to the ascending aorta measures 2.7 cm, image 22/3. Subcarinal lymph node measures 2.7 cm, image 29/3. Right perihilar nodal mass measures 3.0 by 3.5 by 4.2 cm, image 27/3 and image 78/5. Left AP window lymph node measures 1.3 cm, image 21/3. Prominent left hilar lymph nodes are also noted which measure up to 1.1 cm. Lungs/Pleura: Centrilobular and paraseptal emphysema. No pleural effusion identified. Within the posteromedial right upper lobe there is a lung mass which is contiguous with the right hilum. This measures 3.8 x 2.8 by 3.6 cm, image 50/4 and image 92/5. Innumerable lung nodules are scattered throughout both lungs compatible with diffuse metastases. Index nodule in the superior segment of left lower lobe measures 1.1 cm, image 62/4 Subpleural nodule in the posteromedial right lower lobe measures 1.4 cm, image 80/4. Central left upper lobe  lung nodule measures 0.8 cm, image 65/4. Musculoskeletal: Scattered lucent bone lesions  are identified within the bony thorax. These are better seen on the MRI from 09/21/2020. Pathologic compression fracture noted at T7 with loss of 50% of the vertebral body height. Again seen are signs of extra osseous extension with epidural tumor at this level. CT ABDOMEN PELVIS FINDINGS Hepatobiliary: Diffuse liver metastases identified. Index lesion within the subcapsular aspect of segment 7 measures 2.1 cm, image 44/3. Index lesion within segment 4 B measures 1.8 cm, image 47/3. Caudate lobe lesion measures 1.8 cm, image 50/3. Gallbladder is unremarkable.  No biliary ductal dilatation. Pancreas: Unremarkable. No pancreatic ductal dilatation or surrounding inflammatory changes. Spleen: Normal in size without focal abnormality. Adrenals/Urinary Tract: Normal appearance of the adrenal glands. Mild bilateral renal cortical volume loss. No suspicious mass or hydronephrosis identified. Urinary bladder is unremarkable. Stomach/Bowel: There is moderate distension of the stomach. No bowel wall thickening, inflammation, or distension. Vascular/Lymphatic: Aortic atherosclerosis. No aneurysm. No abdominopelvic adenopathy. Reproductive: Prostate is unremarkable. Other: No ascites or focal fluid collections. Signs of peritoneal metastases identified. Index lesion adjacent to the transverse colon within the left upper quadrant measures 2.5 cm, image 53/3. Musculoskeletal: Osseous metastases within the lumbar spine and sacrum are better demonstrated on recent MRI from 07/22/2020. The pathologic fracture involving the L3 vertebral body is again noted and appears unchanged, image 88/6. the left paraspinous intra muscular metastasis is noted measuring 4 cm, image 92/3. IMPRESSION: 1. Right upper lobe lung mass is identified which is contiguous with the right hilum and is concerning for primary bronchogenic carcinoma. 2. Bilateral pulmonary nodularity compatible with metastatic disease. 3. Extensive thoracic and abdominal  adenopathy compatible with nodal metastases. There is encasement and narrowing of the superior vena cava and left innominate vein. 4. Diffuse liver metastases. 5. Signs of peritoneal metastases. 6. Osseous metastases within the bony thorax, lumbar spine and sacrum are better demonstrated on the recent MRI from 09/21/2020. Pathologic fractures are noted involving the T7 and L3 vertebra. 7. 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 8. Emphysema and aortic atherosclerosis. Aortic Atherosclerosis (ICD10-I70.0) and Emphysema (ICD10-J43.9). Electronically Signed   By: Kerby Moors M.D.   On: 09/22/2020 09:30   ECHOCARDIOGRAM COMPLETE  Result Date: 09/22/2020    ECHOCARDIOGRAM REPORT   Patient Name:   Tyler Barker Date of Exam: 09/22/2020 Medical Rec #:  [id number]     Height:       70.0 in Accession #:    [id number]    Weight:       200.0 lb Date of Birth:  [birth year]    BSA:          2.087 m Patient Age:    26 years      BP:           113/72 mmHg Patient Gender: M             HR:           68 bpm. Exam Location:  Inpatient Procedure: 2D Echo Indications:    stroke 434.91  History:        Patient has no prior history of Echocardiogram examinations.                 Risk Factors:Current Smoker.  Sonographer:    Jannett Celestine RDCS (AE) Referring Phys: [id number] TIMOTHY S OPYD  Sonographer Comments: Image acquisition challenging due to respiratory motion. see comments regarding exam. off axis windows IMPRESSIONS  1. Left ventricular  ejection fraction, by estimation, is 60 to 65%. The left ventricle has normal function. The left ventricle has no regional wall motion abnormalities. There is mild left ventricular hypertrophy. Left ventricular diastolic parameters were normal.  2. Right ventricular systolic function is normal. The right ventricular size is normal.  3. The mitral valve is normal in structure. No evidence of mitral valve regurgitation. No evidence of mitral stenosis.  4. The  aortic valve has an indeterminant number of cusps. Aortic valve regurgitation is not visualized. No aortic stenosis is present.  5. The inferior vena cava is normal in size with greater than 50% respiratory variability, suggesting right atrial pressure of 3 mmHg. FINDINGS  Left Ventricle: Left ventricular ejection fraction, by estimation, is 60 to 65%. The left ventricle has normal function. The left ventricle has no regional wall motion abnormalities. The left ventricular internal cavity size was normal in size. There is  mild left ventricular hypertrophy. Left ventricular diastolic parameters were normal. Right Ventricle: The right ventricular size is normal. No increase in right ventricular wall thickness. Right ventricular systolic function is normal. Left Atrium: Left atrial size was normal in size. Right Atrium: Right atrial size was normal in size. Pericardium: There is no evidence of pericardial effusion. Mitral Valve: The mitral valve is normal in structure. No evidence of mitral valve regurgitation. No evidence of mitral valve stenosis. Tricuspid Valve: The tricuspid valve is normal in structure. Tricuspid valve regurgitation is not demonstrated. No evidence of tricuspid stenosis. Aortic Valve: The aortic valve has an indeterminant number of cusps. Aortic valve regurgitation is not visualized. No aortic stenosis is present. Aortic valve mean gradient measures 3.4 mmHg. Aortic valve peak gradient measures 6.9 mmHg. Aortic valve area, by VTI measures 4.15 cm. Pulmonic Valve: The pulmonic valve was not well visualized. Pulmonic valve regurgitation is not visualized. No evidence of pulmonic stenosis. Aorta: The aortic root is normal in size and structure. Pulmonary Artery: Indeterminant PASP, inadequate TR jet. Venous: The inferior vena cava was not well visualized. The inferior vena cava is normal in size with greater than 50% respiratory variability, suggesting right atrial pressure of 3 mmHg. IAS/Shunts:  The interatrial septum was not well visualized.  LEFT VENTRICLE PLAX 2D LVIDd:         3.80 cm  Diastology LVIDs:         2.40 cm  LV e' medial:    9.68 cm/s LV PW:         1.10 cm  LV E/e' medial:  6.6 LV IVS:        1.20 cm  LV e' lateral:   11.90 cm/s LVOT diam:     2.20 cm  LV E/e' lateral: 5.4 LV SV:         81 LV SV Index:   39 LVOT Area:     3.80 cm  RIGHT VENTRICLE RV S prime:     14.60 cm/s TAPSE (M-mode): 2.2 cm LEFT ATRIUM         Index LA diam:    3.60 cm 1.72 cm/m  AORTIC VALVE AV Area (Vmax):    3.70 cm AV Area (Vmean):   3.44 cm AV Area (VTI):     4.15 cm AV Vmax:           131.67 cm/s AV Vmean:          87.607 cm/s AV VTI:            0.194 m AV Peak Grad:  6.9 mmHg AV Mean Grad:      3.4 mmHg LVOT Vmax:         128.00 cm/s LVOT Vmean:        79.200 cm/s LVOT VTI:          0.212 m LVOT/AV VTI ratio: 1.09  AORTA Ao Root diam: 3.30 cm MITRAL VALVE MV Area (PHT): 3.46 cm    SHUNTS MV Decel Time: 219 msec    Systemic VTI:  0.21 m MV E velocity: 64.30 cm/s  Systemic Diam: 2.20 cm MV A velocity: 68.60 cm/s MV E/A ratio:  0.94 Carlyle Dolly MD Electronically signed by Carlyle Dolly MD Signature Date/Time: 09/22/2020/1:41:07 PM    Final    VAS US CAROTID (at [facility] and [facility] only)  Result Date: 09/22/2020 Carotid Arterial Duplex Study Indications:       CVA and Back pain, difficulty walking. Risk Factors:      Current smoker. 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 Comparison Study:  No prior study Performing Technologist: Sharion Dove RVS  Examination Guidelines: A complete evaluation includes B-mode imaging, spectral Doppler, color Doppler, and power Doppler as needed of all accessible portions of each vessel. Bilateral testing is considered an integral part of a complete examination. Limited examinations for reoccurring indications may be performed as noted.  Right Carotid Findings:  +----------+--------+--------+--------+------------------+------------------+           PSV cm/sEDV cm/sStenosisPlaque DescriptionComments           +----------+--------+--------+--------+------------------+------------------+ CCA Prox  105     26                                intimal thickening +----------+--------+--------+--------+------------------+------------------+ CCA Distal106     36                                intimal thickening +----------+--------+--------+--------+------------------+------------------+ ICA Prox  82      27              heterogenous                         +----------+--------+--------+--------+------------------+------------------+ ICA Distal95      34                                                   +----------+--------+--------+--------+------------------+------------------+ ECA       157     31                                                   +----------+--------+--------+--------+------------------+------------------+ +----------+--------+-------+--------+-------------------+           PSV cm/sEDV cmsDescribeArm Pressure (mmHG) +----------+--------+-------+--------+-------------------+ [id number]                                        +----------+--------+-------+--------+-------------------+ +---------+--------+--+--------+--+  VertebralPSV cm/s27EDV cm/s16 +---------+--------+--+--------+--+  Left Carotid Findings: +----------+--------+--------+--------+------------------+------------------+           PSV cm/sEDV cm/sStenosisPlaque DescriptionComments           +----------+--------+--------+--------+------------------+------------------+ CCA Prox  104     34                                intimal thickening +----------+--------+--------+--------+------------------+------------------+ CCA Distal116     37                                intimal thickening  +----------+--------+--------+--------+------------------+------------------+ ICA Prox  105     38              heterogenous                         +----------+--------+--------+--------+------------------+------------------+ ICA Distal105     29                                                   +----------+--------+--------+--------+------------------+------------------+ ECA       121     22                                                   +----------+--------+--------+--------+------------------+------------------+ +----------+--------+--------+--------+-------------------+           PSV cm/sEDV cm/sDescribeArm Pressure (mmHG) +----------+--------+--------+--------+-------------------+ [id number]                                          +----------+--------+--------+--------+-------------------+ +---------+--------+--+--------+--+ VertebralPSV cm/s52EDV cm/s19 +---------+--------+--+--------+--+   Summary: Right Carotid: The extracranial vessels were near-normal with only minimal wall                thickening or plaque. Left Carotid: The extracranial vessels were near-normal with only minimal wall               thickening or plaque. Vertebrals:  Bilateral vertebral arteries demonstrate antegrade flow. Subclavians: Normal flow hemodynamics were seen in bilateral subclavian              arteries. *See table(s) above for measurements and observations.     Preliminary    Assessment and Plan:  Tyler Barker is a 59 year old male with 1.  Widely metastatic cancer 2.  Acute ischemic CVA 3.  Renal insufficiency 4.  Tobacco dependence  -Imaging findings have been discussed with the patient.  We discussed that findings are concerning for metastatic malignancy.  Suspect this may be a lung primary.  We discussed that we need to await the biopsy results to definitively know what kind of cancer we are dealing with.  Diagnosis, prognosis, and treatment options to be discussed  pending biopsy results. -Continue dexamethasone per hospitalist/radiation oncology. -Radiation oncology is following and awaiting biopsy results.  They will reach back out to the patient once biopsy results have been obtained to discuss role for radiation. -The patient tells me that he is currently homeless.  He is living in  his truck.  Although he has family in [name], I am not clear that they are able to be involved in his care.  We discussed that we need to have a definitive disposition plan ready for when he is stable for discharge.  He mentions possibly getting care through the [place] system and possibly qualifying for disability.  I have placed a TOC consult to help with this.  Thank you for this referral.   Mikey Bussing, DNP, AGPCNP-BC, AOCNP  Attending Note  I personally saw the patient, reviewed the chart and examined the patient. The plan of care was discussed with the patient . I agree with the assessment and plan as documented above. 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 I discussed with him that systemic chemotherapy might prolong his life but he has an incurable cancer that will most definitely cause his demise in a very short period of time.  If not treated his prognosis is extremely poor with less than a couple of months of median survival expected.  Prognosis would also depend on the pathology findings. -I called his daughter and left her a message to call me back. 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 -Vertebral compression fracture with lower extremity weakness: Neurosurgery and radiation oncology have been consulted.  It appears that the patient is not keen on pursuing any local or systemic treatment options.  His plan if his daughter would allow, would be to go back and live the last days at their home in [name].  He is uncertain if  they would accept him since there had not been very close over the years especially with his second daughter. We will follow the path report.

## 2020-09-23 NOTE — Consult Note (Signed)
Radiation Oncology         [phone] ________________________________  Name: Strother Everitt        MRN: [id number]  Date of Service: 09/23/20 DOB: 07/14/1961  PP:[id number], No Pcp Per    REFERRING PHYSICIAN: Dr. Dwyane Dee  DIAGNOSIS: The primary encounter diagnosis was Leg weakness, bilateral. Diagnoses of Metastasis to brain [facility]), Malignant neoplasm metastatic to bone [facility]), and Lung mass were also pertinent to this visit.   HISTORY OF PRESENT ILLNESS: Adem Costlow is a 59 y.o. male seen at the request of Dr. 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  The patient is a Administrator by trade, and his home base is in [name].  He is currently in [place] for work, and presented on 09/21/2020 with back pain leg numbness and difficulty with walking.  Apparently his symptoms came on within the last week without any preceding injury.  He denied any symptoms of headaches visual changes shortness of breath or cough, and work-up has included a CT of the head without contrast which showed concerning for acute infarct in the posterior right parietal lobe.  An MRI with and without contrast was then performed revealing multiple lesions concerning for metastatic disease the largest measuring up to 2 cm in the right parietal lobe with associated hemorrhage and/or necrosis with localized vasogenic edema without regional mass-effect or midline shift, several additional subcentimeter lesions were seen including a 4 mm anterior left frontal lobe lesion, punctate lesion in the deep white matter of the right frontal lobe, 4 mm lesion in the right frontal lobe, 6 mm lesion in the left temporal lobe, and a 4 mm lesion in the right occipital lobe.  6 lesions were seen in total.  There was also a few scattered subcentimeter acute ischemic nonhemorrhagic infarcts involving the left frontal lobe and right periatrial white matter and right occipital lobe as well as a lesion of the  occipital condyle on the left and small chronic ischemic infarcts in the bilateral thalami and left cerebellum.  A total spine MRI was performed revealing multiple levels of what appears to be metastatic disease, the most concerning location however includes disease at T7 with associated pathologic fracture, height loss of up to 50%, extraosseous extension with a enhancing tumor extending into the ventral and right epidural space, slight cephalad extension of epidural tumor to T6 and T8 with superimposed prominence of dorsal epidural fat and severe spinal stenosis with compression/impingement of the thoracic spinal cord.  No appreciable cord signaling was noted at that level, the tumor also extends into the bilateral T6-7 and T7-8 neuroforamina with extension into the right anterior lateral paraspinous soft tissues at the level of T7.  There was additional epidural extension of tumor into the ventral epidural space at T9 there was also extraosseous extension of tumor at T3 spinous process, and interspinous region at T8 7 through 8 the imaging also showed a right perihilar mass and adenopathy as well as extensive mediastinal and right supraclavicular adenopathy.  In the cervical spine, there was disease from C5-T1 without fracture or extraosseous extension, there were implants in the paraspinous musculature from levels C5-7 as well as a large 4.3 cm mass at the level of S2 in the sacrum with early extraosseous extension into the right S2 foramen without frank impingement, osseous disease in the right iliac wing and soft tissue implant in the right gluteal musculature and scattered osseous disease above this in the lumbar spine associated pathologic fracture  at L3 with 25% height loss, a 4.6 cm soft tissue implant to the left of L4 in the paraspinous musculature degenerative changes of the discs between L3 and L5 and facet hypertrophy were also seen.  CT chest abdomen and pelvis was also performed revealing concerns  for a right upper lobe mass contiguous within the right hilum concerning for bronchogenic carcinoma, bilateral pulmonary nodularity consistent with metastatic disease, advanced thoracic and abdominal adenopathy and encasement and narrowing of the superior vena cava and left innominate vein, diffuse liver disease and signs of peritoneal metastases background changes of atherosclerotic disease and emphysema were also seen.  Given all of these findings we were asked to see the patient to consider urgent palliative radiotherapy to multiple locations.     PREVIOUS RADIATION THERAPY: No   PAST MEDICAL HISTORY:  Past Medical History:  Diagnosis Date  . Medical history non-contributory        PAST SURGICAL HISTORY: Past Surgical History:  Procedure Laterality Date  . APPENDECTOMY  1979     FAMILY HISTORY:  Family History  Problem Relation Age of Onset  . Diabetes Maternal Aunt   . Alcoholism Paternal Grandmother      SOCIAL HISTORY:  reports that he has been smoking. He has been smoking about 1.00 pack per day. He has never used smokeless tobacco. He reports previous alcohol use. He reports previous drug use. The patient is widowed. He calls [place], [place] his home base but does not have established medical care there. He has an adult daughter.   ALLERGIES: Patient has no known allergies.   MEDICATIONS:  Current Facility-Administered Medications  Medication Dose Route Frequency Provider Last Rate Last Admin  .  stroke: mapping our early stages of recovery book   Does not apply Once Opyd, Ilene Qua, MD      . acetaminophen (TYLENOL) tablet 650 mg  650 mg Oral Q4H PRN Opyd, Ilene Qua, MD       Or  . acetaminophen (TYLENOL) 160 MG/5ML solution 650 mg  650 mg Per Tube Q4H PRN Opyd, Ilene Qua, MD       Or  . acetaminophen (TYLENOL) suppository 650 mg  650 mg Rectal Q4H PRN Opyd, Ilene Qua, MD      . aspirin suppository 300 mg  300 mg Rectal Daily Opyd, Ilene Qua, MD       Or  .  aspirin tablet 325 mg  325 mg Oral Daily Opyd, Ilene Qua, MD   325 mg at 09/22/20 1012  . dexamethasone (DECADRON) injection 4 mg  4 mg Intravenous Q12H Shawna Clamp, MD   4 mg at 09/22/20 2330  . enoxaparin (LOVENOX) injection 40 mg  40 mg Subcutaneous Daily Opyd, Ilene Qua, MD   40 mg at 09/22/20 1012  . HYDROmorphone (DILAUDID) injection 0.5-1 mg  0.5-1 mg Intravenous Q4H PRN Opyd, Ilene Qua, MD   1 mg at 09/23/20 0630  . ondansetron (ZOFRAN) injection 4 mg  4 mg Intravenous Q6H PRN Opyd, Ilene Qua, MD   4 mg at 09/22/20 0819  . senna-docusate (Senokot-S) tablet 1 tablet  1 tablet Oral QHS PRN Opyd, Ilene Qua, MD       Current Outpatient Medications  Medication Sig Dispense Refill  . naproxen sodium (ALEVE) 220 MG tablet Take 220 mg by mouth 2 (two) times daily as needed (pain).       REVIEW OF SYSTEMS: unable to assess due to trajectory of conversation.     PHYSICAL EXAM:  Wt Readings  from Last 3 Encounters:  09/21/20 200 lb (90.7 kg)   Temp Readings from Last 3 Encounters:  09/23/20 98.8 F (37.1 C) (Oral)   BP Readings from Last 3 Encounters:  09/23/20 113/70   Pulse Readings from Last 3 Encounters:  09/23/20 64   Pain Assessment Pain Score: 6 /10  Unable to assess due to encounter type.  ECOG = 4  0 - Asymptomatic (Fully active, able to carry on all predisease activities without restriction)  1 - Symptomatic but completely ambulatory (Restricted in physically strenuous activity but ambulatory and able to carry out work of a light or sedentary nature. For example, light housework, office work)  2 - Symptomatic, <50% in bed during the day (Ambulatory and capable of all self care but unable to carry out any work activities. Up and about more than 50% of waking hours)  3 - Symptomatic, >50% in bed, but not bedbound (Capable of only limited self-care, confined to bed or chair 50% or more of waking hours)  4 - Bedbound (Completely disabled. Cannot carry on any  self-care. Totally confined to bed or chair)  5 - Death   Eustace Pen MM, Creech RH, Tormey DC, et al. [phone]). "Toxicity and response criteria of the [facility] Group". [place] Oncol. 5 (6): 649-55    LABORATORY DATA:  Lab Results  Component Value Date   WBC 9.8 09/23/2020   HGB 14.4 09/23/2020   HCT 43.2 09/23/2020   MCV 98.9 09/23/2020   PLT 284 09/23/2020   Lab Results  Component Value Date   NA 134 (L) 09/23/2020   K 4.0 09/23/2020   CL 102 09/23/2020   CO2 22 09/23/2020   Lab Results  Component Value Date   ALT 22 09/21/2020   AST 29 09/21/2020   ALKPHOS 105 09/21/2020   BILITOT 0.9 09/21/2020      RADIOGRAPHY: CT Head Wo Contrast  Result Date: 09/21/2020 CLINICAL DATA:  Dizziness and leg weakness for the past few days. EXAM: CT HEAD WITHOUT CONTRAST TECHNIQUE: Contiguous axial images were obtained from the base of the skull through the vertex without intravenous contrast. COMPARISON:  None. FINDINGS: Brain: Cortical and subcortical hypodensity with loss of the normal gray-white matter differentiation in the posterior right parietal lobe, concerning for acute infarct. Small old lacunar infarct in the left cerebellum. No evidence of hemorrhage, hydrocephalus, extra-axial collection or mass lesion/mass effect. Vascular: No hyperdense vessel or unexpected calcification. Skull: Normal. Negative for fracture or focal lesion. Sinuses/Orbits: No acute finding. Other: None. IMPRESSION: 1. Findings concerning for acute infarct in the posterior right parietal lobe. No hemorrhage. 2. Small old lacunar infarct in the left cerebellum. Electronically Signed   By: Titus Dubin M.D.   On: 09/21/2020 17:14   MR BRAIN W WO CONTRAST  Result Date: 09/22/2020 EXAM: MRI HEAD WITHOUT AND WITH CONTRAST TECHNIQUE: Multiplanar, multiecho pulse sequences of the brain and surrounding structures were obtained without and with intravenous contrast. CONTRAST:  70mL GADAVIST GADOBUTROL  1 MMOL/ML IV SOLN COMPARISON:  Prior CT from earlier the same day. FINDINGS: Brain: Multiple scattered enhancing lesions are seen throughout the brain, consistent with intracranial metastatic disease. These are seen as follows: - dominant and most prominent of these lesions is positioned at the parasagittal right parietal lobe in measures 2.0 x 1.5 x 1.8 cm (series 23, image 35). Associated susceptibility artifact compatible with hemorrhage and/or necrosis. Localized vasogenic edema without significant regional mass effect or midline shift. Several additional subcentimeter lesions are  seen as follows: - 4 mm lesion at the subcortical anterior left frontal lobe (series 23, image 37) - punctate lesion at the deep white matter of the right frontal lobe (series 24, image 23). - additional possible 4 mm lesion at the subcortical anterior right frontal lobe (series 24, image 25). - 6 mm rim enhancing lesion at the mid left temporal lobe (series 25, image 21). - 4 mm lesion at the right occipital lobe (series 23, image 23). Approximate 6 lesions are seen in total. Underlying cerebral volume within normal limits. No significant cerebral white matter disease. Small remote lacunar infarcts noted at the thalami bilaterally. Additional small focus of encephalomalacia without associated enhancement at the peripheral left cerebellum consistent with a chronic ischemic infarct as well (series 15, image 7). 4 mm focus of restricted diffusion at the subcortical right occipital lobe consistent with a small acute ischemic infarct (series 9, image 33). No associated hemorrhage or mass effect. Additional subtle diffusion abnormality involving the cortical gray matter of the high left frontal lobe also consistent with an acute ischemic infarct (series 9, images 91, 89). w additional subtle 7 mm focus of diffusion abnormality involving the right occipital pole without associated enhancement also suspicious for a small acute ischemic  infarct (series 9, image 67). No other evidence for acute or subacute ischemia. Gray-white matter differentiation otherwise maintained. No midline shift or hydrocephalus. No extra-axial fluid collection. Pituitary gland suprasellar region within normal limits. Midline structures intact. Vascular: Major intracranial vascular flow voids are maintained. Skull and upper cervical spine: Craniocervical junction within normal limits. Metastatic lesion involving the left occipital condyle noted (series 13, image 15). No other definite osseous lesions about the calvarium. Scalp soft tissues within normal limits. Sinuses/Orbits: Globes and orbital soft tissues within normal limits. Mild scattered mucosal thickening noted within the ethmoidal air cells. Paranasal sinuses are otherwise largely clear. No significant mastoid effusion. Inner ear structures within normal limits. Other: None. IMPRESSION: 1. Multiple scattered enhancing lesions throughout the brain, consistent with intracranial metastatic disease. The dominant lesion is positioned at the right parietal lobe and measures up to 2 cm. Localized vasogenic edema without significant regional mass effect about this lesion. Remainder of the lesions are subcentimeter in size. 2. Few scattered subcentimeter acute ischemic nonhemorrhagic infarcts involving the left frontal lobe, right periatrial white matter, and right occipital lobe as above. 3. Osseous metastatic lesion involving the left occipital condyle. 4. 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 Findings communicated by telephone at the time of interpretation on 09/21/2020 at 11:55 p.m. to provider Arlean Hopping, who verbally acknowledged these results. Electronically Signed   By: Jeannine Boga M.D.   On: 09/22/2020 00:24   MR CERVICAL SPINE W WO CONTRAST  Result Date: 09/22/2020 CLINICAL DATA:  Initial evaluation for lower extremity weakness. EXAM: MRI CERVICAL SPINE  WITHOUT AND WITH CONTRAST TECHNIQUE: Multiplanar and multiecho pulse sequences of the cervical spine, to include the craniocervical junction and cervicothoracic junction, were obtained without and with intravenous contrast. CONTRAST:  55mL GADAVIST GADOBUTROL 1 MMOL/ML IV SOLN COMPARISON:  None available. FINDINGS: Alignment: Straightening of the normal cervical lordosis. Trace anterolisthesis of C7 on T1, chronic and facet mediated. Vertebrae: Osseous metastatic implant seen involving the left occipital condyle. Additional osseous metastasis involves the posterior aspect of the C5 vertebral body. Metastatic lesion largely replaces the C6 vertebral body. Additional metastases seen involving the right posterior elements of C7 and T1. Additional signal abnormality involving the anterior aspect of C4  could reflect an additional lesion versus reactive degenerative change. No associated pathologic fracture. No significant extra osseous extension of tumor in the cervical spine. Cord: Normal signal and morphology. No intramedullary or epidural tumor. No abnormal enhancement. Posterior Fossa, vertebral arteries, paraspinal tissues: 9 mm metastatic soft tissue implant seen involving the subcutaneous fat along the midline of the posterior cervical spine (series 36, image 5). Additional 1.5 cm soft tissue implant involves the right posterior paraspinous musculature at the level of C7 (series 38, image 33). Smaller 1 cm soft tissue implant involving the right posterior paraspinous musculature at C5-6 (series 38, image 25). Abnormal right supraclavicular adenopathy consistent with nodal metastatic disease, with the dominant node measuring 2.9 x 1.8 cm (series 38, image 33). Disc levels: C2-C3: Shallow central disc protrusion minimally indents the ventral thecal sac. Mild left-sided facet hypertrophy. No spinal stenosis. Foramina remain patent. C3-C4: Central disc osteophyte complex indents the ventral thecal sac, partially  effacing the ventral CSF. Moderate left-sided facet hypertrophy with bilateral uncovertebral spurring. Resultant mild spinal stenosis without significant cord deformity. Severe left with moderate right C4 foraminal narrowing. C4-C5: Small central disc protrusion indents the ventral thecal sac. No significant stenosis or cord deformity. Superimposed mild left sided facet hypertrophy with uncovertebral spurring. Resultant moderate left worse than right C5 foraminal stenosis. C5-C6: Disc desiccation without disc bulge. Mild uncovertebral hypertrophy on the right. No spinal stenosis. Foramina remain patent. C6-C7: Mild disc bulge with right greater than left uncovertebral spurring. No spinal stenosis. Severe right with moderate left C7 foraminal narrowing. C7-T1: Trace anterolisthesis. Minimal disc bulge with bilateral uncovertebral hypertrophy. Moderate left with mild right facet degeneration. No significant spinal stenosis. Moderate right C8 foraminal stenosis. No significant left foraminal encroachment. IMPRESSION: 1. 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 No associated pathologic fracture or extraosseous extension. 2. 9 mm metastatic soft tissue implant involving the subcutaneous fat along the midline of the posterior cervical spine. Additional 1 cm in 1.5 cm soft tissue implants involving the right posterior paraspinous musculature at the levels of C5-6 and C7. 3. Abnormal right supraclavicular adenopathy, consistent with nodal metastatic disease. 4. Multilevel cervical spondylosis with resultant mild spinal stenosis at C3-4. Moderate to severe bilateral C4, C5, C7, and right C8 foraminal stenosis as above. Findings communicated by telephone at the time of interpretation on 09/21/2020 at 11:55 p.m. to provider Arlean Hopping, who verbally acknowledged these  results. Electronically Signed   By: Jeannine Boga M.D.   On: 09/22/2020 00:42   MR THORACIC SPINE W WO CONTRAST  Result Date: 09/22/2020 CLINICAL DATA:  Initial evaluation for acute lower extremity weakness. EXAM: MRI THORACIC WITHOUT AND WITH CONTRAST TECHNIQUE: Multiplanar and multiecho pulse sequences of the thoracic spine were obtained without and with intravenous contrast. CONTRAST:  57mL GADAVIST GADOBUTROL 1 MMOL/ML IV SOLN COMPARISON:  None available. FINDINGS: MRI THORACIC SPINE FINDINGS Alignment: Mild dextroscoliosis. Alignment otherwise normal with preservation of the normal thoracic kyphosis. No listhesis. Vertebrae: Widespread osseous metastatic disease seen throughout the thoracic spine, with prominent lesion seen involving the posterior elements of T3, as well as the T5, T6, T7, T8, T9, and T12 vertebral bodies. Involvement is greatest at the level of T7 where there is an associated pathologic fracture. Associated height loss of up to approximate 50%. Associated extraosseous extension with enhancing tumor seen extending into the ventral and right epidural space (  series 42, image 38). Slight cephalad extension of epidural tumor to the levels of T6 and T8 inferiorly. Superimposed prominence of the dorsal epidural fat. Associated severe spinal stenosis with compression/impingement of the thoracic spinal cord at this level. No appreciable cord signal changes at this time. Tumor also extends into the bilateral T6-7 and T7-8 neural foramina. Extension into the right anterolateral paraspinous soft tissues also noted at the level of T7 (series 39, image 1). There is additional epidural extension of tumor into the ventral epidural space at the level of T9 with no more than mild spinal stenosis (series 42, image 50). No other significant epidural tumor. Extraosseous extension of tumor also noted about the T3 spinous process metastasis (series 39, image 8). Abnormal enhancement within the inter  spinous region at T7-8 also consistent with extraosseous tumor (series 39, image 8). Cord: Ventral epidural tumor at the levels of T7 and T9 as above. Associated cord impingement without cord signal changes at the level of T7. Signal intensity within the thoracic spinal cord is otherwise normal. Paraspinal and other soft tissues: Paraspinous tumor centered about the level of T7. Additional extraosseous tumor about the spinous processes of T3 and T7. Large right perihilar mass and/or adenopathy partially visualized within the lungs (series 42, image 26). Extensive mediastinal and right supraclavicular adenopathy. Additional scattered bilateral pulmonary nodules concerning for metastatic disease. Few scattered lesions within the liver measuring up to approximately 2 cm concerning for intrahepatic metastases. Disc levels: No significant underlying degenerative disc disease within the thoracic spine. No other high-grade stenosis or impingement. IMPRESSION: 1. 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 2. Associated pathologic fracture of T7 with up to 50% height loss. Extra osseous extension with epidural tumor at this level results in severe spinal stenosis and compression/impingement of the thoracic spinal cord. No cord signal changes at this time. 3. Additional mild epidural extension of tumor into the ventral epidural space at the level of T9 with no more than mild spinal stenosis. 4. Large right perihilar mass and/or adenopathy, raising the possibility for a primary lung malignancy. Additional extensive mediastinal and supraclavicular adenopathy with widespread pulmonary nodules concerning for metastatic disease. Further evaluation with dedicated cross-sectional imaging of the chest recommended. 5. Few scattered lesions within the liver, concerning for intrahepatic metastases. Findings communicated by telephone at the time of interpretation on 09/21/2020 at 11:55 p.m. to  provider Arlean Hopping, who verbally acknowledged these results. Electronically Signed   By: Jeannine Boga M.D.   On: 09/22/2020 01:02   MR Lumbar Spine W Wo Contrast  Result Date: 09/22/2020 CLINICAL DATA:  Initial evaluation for acute lower extremity weakness. EXAM: MRI LUMBAR SPINE WITHOUT AND WITH CONTRAST TECHNIQUE: Multiplanar and multiecho pulse sequences of the lumbar spine were obtained without and with intravenous contrast. CONTRAST:  24mL GADAVIST GADOBUTROL 1 MMOL/ML IV SOLN COMPARISON:  None available. FINDINGS: Segmentation:  Standard. Alignment:  Physiologic. Vertebrae: Scattered osseous metastatic disease seen throughout the lumbar spine and sacrum. Most prominent involvement seen at the level of L3 which is largely replaced by a metastatic lesion. Associated pathologic fracture with up to 25% height loss without bony retropulsion. Additional metastatic lesion involves the left transverse process of L2 (series 11, image 14). Few additional lesions noted about the sacrum. No significant extra osseous extension or epidural tumor. Conus medullaris and cauda equina: Conus extends to the T12-L1 level. Conus and cauda equina appear normal. Paraspinal and other soft tissues: 4.6 cm metastatic soft tissue implants seen within  the left posterior paraspinous musculature at the level of L4 (series 32, image 14). Few additional subcentimeter nodular densities about the retroperitoneal fat suspicious for small metastases as well (series 35, image 5 on the left, image 14 on the right). Visualized visceral structures otherwise unremarkable. Disc levels: L1-2: Normal interspace. Moderate left with mild right facet hypertrophy. No stenosis. L2-3: Mild disc bulge with disc desiccation. Moderate right worse than left facet hypertrophy. No significant spinal stenosis. Mild right foraminal narrowing. L3-4: Mild diffuse disc bulge with disc desiccation. Superimposed right foraminal to extraforaminal disc protrusion  contacts the exiting right L3 nerve root (series 13, image 23). Severe right with moderate left facet hypertrophy. Resultant mild narrowing of the lateral recesses bilaterally. Mild bilateral L3 foraminal stenosis. L4-5: Mild disc bulge. Superimposed shallow right foraminal to extraforaminal disc protrusion with annular fissure, contacting the exiting right L4 nerve root (series 13, image 29). Moderate left worse than right facet hypertrophy. Resultant mild canal with bilateral subarticular stenosis. Mild bilateral L4 foraminal narrowing. L5-S1: Mild disc bulge. Associated annular fissure at the level of the right neural foramen. Moderate right with mild left facet hypertrophy. No significant spinal stenosis. Mild right foraminal narrowing. IMPRESSION: 1. Scattered osseous metastatic disease throughout the lumbar spine and sacrum as above. Associated pathologic fracture at the level of L3 with up to 25% height loss without bony retropulsion. No epidural or intracanalicular tumor. 2. 4.6 cm metastatic soft tissue implants within the left posterior paraspinous musculature at the level of L4. 3. Right foraminal to extraforaminal disc protrusions at L3-4 and L4-5, contacting and potentially irritating the exiting right L3 and L4 nerve roots respectively. 4. Underlying moderate to advanced multilevel facet hypertrophy as detailed above. Electronically Signed   By: Jeannine Boga M.D.   On: 09/22/2020 01:12   MR SACRUM SI JOINTS W WO CONTRAST  Result Date: 09/22/2020 CLINICAL DATA:  Initial evaluation for acute lower extremity weakness. EXAM: MRI SACRUM WITHOUT AND WITH CONTRAST TECHNIQUE: Multiplanar multi-sequence MR imaging of the sacrum was performed with and without IV contrast. 9 cc of Gadavist was administered. COMPARISON:  None available. FINDINGS: Urinary Tract: Partially distended bladder moderately distended without abnormality. Bowel: Visualized bowel within normal limits without evidence for  obstruction or inflammation. Vascular/Lymphatic: Normal vascular flow void seen throughout the visualized pelvis. No appreciable adenopathy. Reproductive:  Unremarkable. Other: Metastatic soft tissue implant measuring approximately 1.8 cm partially visualize within the right gluteal musculature (series 40, image 14). Additional 2 cm implant seen more superiorly within the right gluteal musculature (series 40, image 4). Musculoskeletal: Dominant metastases seen involving the right sacrum at the level of the S2 segment. Lesion measures approximately 3.5 x 4.3 x 4.1 cm (series 40, image 14). Early extraosseous extension of tumor into the adjacent right S2 foramen without frank neural impingement (series 41, image 13). Additional 2.2 cm metastasis noted more superiorly and anteriorly at the right sacral ala (series 40, image 16). 4.4 cm osseous metastasis involves the right iliac wing (series 40, image 18). No associated pathologic fracture. No other significant extra osseous extension of tumor. IMPRESSION: 1. 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 Early extraosseous extension into the adjacent right S2 foramen without frank neural impingement. 2. Additional osseous metastases involving the right sacral ala and right iliac wing as above. No pathologic fracture or other complication. 3. Metastatic soft tissue implants within the right gluteal musculature as above. Electronically Signed   By: Pincus Badder.D.  On: 09/22/2020 01:19   CT CHEST ABDOMEN PELVIS W CONTRAST  Result Date: 09/22/2020 CLINICAL DATA:  Evaluate for neoplasm. EXAM: CT CHEST, ABDOMEN, AND PELVIS WITH CONTRAST TECHNIQUE: Multidetector CT imaging of the chest, abdomen and pelvis was performed following the standard protocol during bolus administration of intravenous contrast. CONTRAST:  19mL OMNIPAQUE IOHEXOL 300 MG/ML  SOLN COMPARISON:  None FINDINGS: CT CHEST FINDINGS Cardiovascular: The  heart size appears normal. No pericardial effusion identified. Aortic atherosclerosis. Mediastinum/Nodes: Normal appearance of the thyroid gland. The trachea appears patent and is midline. Normal appearance of the esophagus. Extensive thoracic adenopathy identified compatible with nodal metastases. Right supraclavicular lymph node measures 1.6 cm, image 5/3 Nodal mass within the right paratracheal region measures 7.3 x 5.2 by 6.8 cm, image 16/3. This encases and narrows the SVC and left innominate vein. Pre-vascular lymph node anterior to the ascending aorta measures 2.7 cm, image 22/3. Subcarinal lymph node measures 2.7 cm, image 29/3. Right perihilar nodal mass measures 3.0 by 3.5 by 4.2 cm, image 27/3 and image 78/5. Left AP window lymph node measures 1.3 cm, image 21/3. Prominent left hilar lymph nodes are also noted which measure up to 1.1 cm. Lungs/Pleura: Centrilobular and paraseptal emphysema. No pleural effusion identified. Within the posteromedial right upper lobe there is a lung mass which is contiguous with the right hilum. This measures 3.8 x 2.8 by 3.6 cm, image 50/4 and image 92/5. Innumerable lung nodules are scattered throughout both lungs compatible with diffuse metastases. Index nodule in the superior segment of left lower lobe measures 1.1 cm, image 62/4 Subpleural nodule in the posteromedial right lower lobe measures 1.4 cm, image 80/4. Central left upper lobe lung nodule measures 0.8 cm, image 65/4. Musculoskeletal: Scattered lucent bone lesions are identified within the bony thorax. These are better seen on the MRI from 09/21/2020. Pathologic compression fracture noted at T7 with loss of 50% of the vertebral body height. Again seen are signs of extra osseous extension with epidural tumor at this level. CT ABDOMEN PELVIS FINDINGS Hepatobiliary: Diffuse liver metastases identified. Index lesion within the subcapsular aspect of segment 7 measures 2.1 cm, image 44/3. Index lesion within segment 4  B measures 1.8 cm, image 47/3. Caudate lobe lesion measures 1.8 cm, image 50/3. Gallbladder is unremarkable.  No biliary ductal dilatation. Pancreas: Unremarkable. No pancreatic ductal dilatation or surrounding inflammatory changes. Spleen: Normal in size without focal abnormality. Adrenals/Urinary Tract: Normal appearance of the adrenal glands. Mild bilateral renal cortical volume loss. No suspicious mass or hydronephrosis identified. Urinary bladder is unremarkable. Stomach/Bowel: There is moderate distension of the stomach. No bowel wall thickening, inflammation, or distension. Vascular/Lymphatic: Aortic atherosclerosis. No aneurysm. No abdominopelvic adenopathy. Reproductive: Prostate is unremarkable. Other: No ascites or focal fluid collections. Signs of peritoneal metastases identified. Index lesion adjacent to the transverse colon within the left upper quadrant measures 2.5 cm, image 53/3. Musculoskeletal: Osseous metastases within the lumbar spine and sacrum are better demonstrated on recent MRI from 07/22/2020. The pathologic fracture involving the L3 vertebral body is again noted and appears unchanged, image 88/6. the left paraspinous intra muscular metastasis is noted measuring 4 cm, image 92/3. IMPRESSION: 1. Right upper lobe lung mass is identified which is contiguous with the right hilum and is concerning for primary bronchogenic carcinoma. 2. Bilateral pulmonary nodularity compatible with metastatic disease. 3. Extensive thoracic and abdominal adenopathy compatible with nodal metastases. There is encasement and narrowing of the superior vena cava and left innominate vein. 4. Diffuse liver metastases. 5. Signs of peritoneal  metastases. 6. Osseous metastases within the bony thorax, lumbar spine and sacrum are better demonstrated on the recent MRI from 09/21/2020. Pathologic fractures are noted involving the T7 and L3 vertebra. 7. 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  8. Emphysema and aortic atherosclerosis. Aortic Atherosclerosis (ICD10-I70.0) and Emphysema (ICD10-J43.9). Electronically Signed   By: Kerby Moors M.D.   On: 09/22/2020 09:30   ECHOCARDIOGRAM COMPLETE  Result Date: 09/22/2020    ECHOCARDIOGRAM REPORT   Patient Name:   TONIE Niehaus Date of Exam: 09/22/2020 Medical Rec #:  [id number]     Height:       70.0 in Accession #:    [id number]    Weight:       200.0 lb Date of Birth:  06-Dec-1960    BSA:          2.087 m Patient Age:    7 years      BP:           113/72 mmHg Patient Gender: M             HR:           68 bpm. Exam Location:  Inpatient Procedure: 2D Echo Indications:    stroke 434.91  History:        Patient has no prior history of Echocardiogram examinations.                 Risk Factors:Current Smoker.  Sonographer:    Jannett Celestine RDCS (AE) Referring Phys: [id number] TIMOTHY S OPYD  Sonographer Comments: Image acquisition challenging due to respiratory motion. see comments regarding exam. off axis windows IMPRESSIONS  1. Left ventricular ejection fraction, by estimation, is 60 to 65%. The left ventricle has normal function. The left ventricle has no regional wall motion abnormalities. There is mild left ventricular hypertrophy. Left ventricular diastolic parameters were normal.  2. Right ventricular systolic function is normal. The right ventricular size is normal.  3. The mitral valve is normal in structure. No evidence of mitral valve regurgitation. No evidence of mitral stenosis.  4. The aortic valve has an indeterminant number of cusps. Aortic valve regurgitation is not visualized. No aortic stenosis is present.  5. The inferior vena cava is normal in size with greater than 50% respiratory variability, suggesting right atrial pressure of 3 mmHg. FINDINGS  Left Ventricle: Left ventricular ejection fraction, by estimation, is 60 to 65%. The left ventricle has normal function. The left ventricle has no regional wall motion abnormalities. The left  ventricular internal cavity size was normal in size. There is  mild left ventricular hypertrophy. Left ventricular diastolic parameters were normal. Right Ventricle: The right ventricular size is normal. No increase in right ventricular wall thickness. Right ventricular systolic function is normal. Left Atrium: Left atrial size was normal in size. Right Atrium: Right atrial size was normal in size. Pericardium: There is no evidence of pericardial effusion. Mitral Valve: The mitral valve is normal in structure. No evidence of mitral valve regurgitation. No evidence of mitral valve stenosis. Tricuspid Valve: The tricuspid valve is normal in structure. Tricuspid valve regurgitation is not demonstrated. No evidence of tricuspid stenosis. Aortic Valve: The aortic valve has an indeterminant number of cusps. Aortic valve regurgitation is not visualized. No aortic stenosis is present. Aortic valve mean gradient measures 3.4 mmHg. Aortic valve peak gradient measures 6.9 mmHg. Aortic valve area, by VTI measures 4.15 cm. Pulmonic Valve: The pulmonic valve was not well visualized. Pulmonic valve regurgitation is not  visualized. No evidence of pulmonic stenosis. Aorta: The aortic root is normal in size and structure. Pulmonary Artery: Indeterminant PASP, inadequate TR jet. Venous: The inferior vena cava was not well visualized. The inferior vena cava is normal in size with greater than 50% respiratory variability, suggesting right atrial pressure of 3 mmHg. IAS/Shunts: The interatrial septum was not well visualized.  LEFT VENTRICLE PLAX 2D LVIDd:         3.80 cm  Diastology LVIDs:         2.40 cm  LV e' medial:    9.68 cm/s LV PW:         1.10 cm  LV E/e' medial:  6.6 LV IVS:        1.20 cm  LV e' lateral:   11.90 cm/s LVOT diam:     2.20 cm  LV E/e' lateral: 5.4 LV SV:         81 LV SV Index:   39 LVOT Area:     3.80 cm  RIGHT VENTRICLE RV S prime:     14.60 cm/s TAPSE (M-mode): 2.2 cm LEFT ATRIUM         Index LA diam:     3.60 cm 1.72 cm/m  AORTIC VALVE AV Area (Vmax):    3.70 cm AV Area (Vmean):   3.44 cm AV Area (VTI):     4.15 cm AV Vmax:           131.67 cm/s AV Vmean:          87.607 cm/s AV VTI:            0.194 m AV Peak Grad:      6.9 mmHg AV Mean Grad:      3.4 mmHg LVOT Vmax:         128.00 cm/s LVOT Vmean:        79.200 cm/s LVOT VTI:          0.212 m LVOT/AV VTI ratio: 1.09  AORTA Ao Root diam: 3.30 cm MITRAL VALVE MV Area (PHT): 3.46 cm    SHUNTS MV Decel Time: 219 msec    Systemic VTI:  0.21 m MV E velocity: 64.30 cm/s  Systemic Diam: 2.20 cm MV A velocity: 68.60 cm/s MV E/A ratio:  0.94 Carlyle Dolly MD Electronically signed by Carlyle Dolly MD Signature Date/Time: 09/22/2020/1:41:07 PM    Final    VAS US CAROTID (at [facility] and [facility] only)  Result Date: 09/22/2020 Carotid Arterial Duplex Study Indications:       CVA and Back pain, difficulty walking. Risk Factors:      Current smoker. 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 Comparison Study:  No prior study Performing Technologist: Sharion Dove RVS  Examination Guidelines: A complete evaluation includes B-mode imaging, spectral Doppler, color Doppler, and power Doppler as needed of all accessible portions of each vessel. Bilateral testing is considered an integral part of a complete examination. Limited examinations for reoccurring indications may be performed as noted.  Right Carotid Findings: +----------+--------+--------+--------+------------------+------------------+           PSV cm/sEDV cm/sStenosisPlaque DescriptionComments           +----------+--------+--------+--------+------------------+------------------+ CCA Prox  105     26  intimal thickening +----------+--------+--------+--------+------------------+------------------+ CCA Distal106     36                                intimal thickening  +----------+--------+--------+--------+------------------+------------------+ ICA Prox  82      27              heterogenous                         +----------+--------+--------+--------+------------------+------------------+ ICA Distal95      34                                                   +----------+--------+--------+--------+------------------+------------------+ ECA       157     31                                                   +----------+--------+--------+--------+------------------+------------------+ +----------+--------+-------+--------+-------------------+           PSV cm/sEDV cmsDescribeArm Pressure (mmHG) +----------+--------+-------+--------+-------------------+ [id number]                                        +----------+--------+-------+--------+-------------------+ +---------+--------+--+--------+--+ VertebralPSV cm/s27EDV cm/s16 +---------+--------+--+--------+--+  Left Carotid Findings: +----------+--------+--------+--------+------------------+------------------+           PSV cm/sEDV cm/sStenosisPlaque DescriptionComments           +----------+--------+--------+--------+------------------+------------------+ CCA Prox  104     34                                intimal thickening +----------+--------+--------+--------+------------------+------------------+ CCA Distal116     37                                intimal thickening +----------+--------+--------+--------+------------------+------------------+ ICA Prox  105     38              heterogenous                         +----------+--------+--------+--------+------------------+------------------+ ICA Distal105     29                                                   +----------+--------+--------+--------+------------------+------------------+ ECA       121     21                                                    +----------+--------+--------+--------+------------------+------------------+ +----------+--------+--------+--------+-------------------+           PSV cm/sEDV cm/sDescribeArm Pressure (mmHG) +----------+--------+--------+--------+-------------------+ [id number]                                          +----------+--------+--------+--------+-------------------+ +---------+--------+--+--------+--+  VertebralPSV cm/s52EDV cm/s19 +---------+--------+--+--------+--+   Summary: Right Carotid: The extracranial vessels were near-normal with only minimal wall                thickening or plaque. Left Carotid: The extracranial vessels were near-normal with only minimal wall               thickening or plaque. Vertebrals:  Bilateral vertebral arteries demonstrate antegrade flow. Subclavians: Normal flow hemodynamics were seen in bilateral subclavian              arteries. *See table(s) above for measurements and observations.     Preliminary        IMPRESSION/PLAN: 1. 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 I called and tried to engage with the patient to let him know our service had been consulted.  I explained the work-up thus far and the suspicion that the patient has a stage IV lung cancer that would be benefited by radiotherapy given his symptoms and presentation and to try to preserve neural function. I inquired about where he resides as [place] and was travelling for work, with the intention of trying to coordinate his radiation care. Unfortunately he is not willing to have much conversation about the care we offer until he has more clarity of the type of cancer he has after biopsy. He declines my offer to call his daughter as well. I will check back with him Wednesday to see how he's doing and hopefully have tissue by then. 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 Hopefully steroids will keep his neurologic symptoms controlled, but he is certainly at risk of paralysis and pulmonary compromise, and because of this would recommend urgent radiotherapy when the patient is ready for this conversation. I'll also check with his attending to see if neurosurgery is involved. He would also benefit from brain radiation as well. If he has small cell carcinoma, whole brain radiation would be recommended, but if he has NSCLC, he may be a candidate for preop SRS and would need a 3T MRI scan prior to proceeding. Unfortunately there is much work up taking place and multiple services involved which has been very difficult on the patient.   In a visit lasting 60 minutes, greater than 50% of the time was spent by phone and in floor time discussing the patient's condition, in preparation for the discussion, and coordinating the patient's care.     Carola Rhine, PAC

## 2020-09-23 NOTE — Progress Notes (Signed)
PROGRESS NOTE    Tyler Barker  DGU:[id number] DOB: 02-Oct-1961 DOA: 09/21/2020 PCP: Patient, No Pcp Per    Brief Narrative: This 59 years old male with medical history significant for back injury 10 years ago, chronic smoker presented in the emergency department for the evaluation of back pain associated with lower extremity numbness and difficulty ambulating. Noncontrast head CT raised concern for acute infarction involving posterior right parietal lobe. MRI brain showed multiple scattered enhancing lesions throughout the brain consistent with metastatic disease as well as few scattered subcentimeter acute ischemic infarctions involving the left frontal lobe.  Neurology consulted, Pulmonology consulted for lung biopsy.  Heme-onc consulted, radiation oncology consulted,  recommended iv steroids. Patient is undergoing  supraclavicular lymph node biopsy.  He is originally from [name], once diagnosis is made and plan decided, needs to be transferred to [name]  Assessment & Plan:   Principal Problem:   Metastatic disease ([place]) Active Problems:   Renal insufficiency   Current smoker   Acute ischemic stroke [facility])   Multiple pulmonary nodules   Liver lesion  1. 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  - Right perihilar lung mass on MRI raises possibility of primary lung malignancy.   - CT chest/abd/pelvis showed Diffuse Liver mets, peritoneal mets, Osseus metas.  - Hematology and radiation oncology consulted. - Started Decadron 4 mg every 12 hours as per hematology - IR consulted for supraclavicular lymph node biopsy - If unable to get tissue diagnosis via needle biopsy then plan bronchoscopy with Endo bronchial ultrasound. -  Neurosurgery consulted, recommended radiation therapy.  2. Acute ischemic CVA  - Presents with  several days of worsening back pain, leg numbness, and difficulty ambulating and is found to have widespread metastatic disease and a few scattered subcentimeter acute ischemic infarcts involving both hemispheres  - Neurology consulted and much appreciated, recommended cancer workup.  3. Renal insufficiency, unknown chronicity  - SCr is 1.76 on admission with no prior labs available for comparison  - Renally-dose medications, monitor  - Renal functions trending up   DVT prophylaxis: SCDs Code Status: Full Family Communication:  No family at bed side. Disposition Plan:  Status is: Inpatient  Remains inpatient appropriate because:Inpatient level of care appropriate due to severity of illness   Dispo: The patient is from: Home              Anticipated d/c is to: Home              Anticipated d/c date is: 3 days              Patient currently is not medically stable to d/c.  Consultants:   Neurology  Neurosurgery  Heme-onc  Inpatient oncology  Interventional radiology  Procedures: Supraclavicular lymph node biopsy    Antimicrobials: Anti-infectives (From admission, onward)   None      Subjective: Patient was seen and examined at bedside.  No overnight events.  Patient reports being frustrated with treatment information.  He denies any chest pain but reports having weakness and numbness in the lower extremities.  Objective: Vitals:   09/23/20 1000 09/23/20 1100 09/23/20 1200 09/23/20 1235  BP: 106/61 131/68 114/88   Pulse: 68 67 67   Resp: (!) 21 19 18    Temp:    97.6 F (36.4 C)  TempSrc:    Oral  SpO2:  99% 100% 97%   Weight:      Height:        Intake/Output Summary (Last 24 hours) at 09/23/2020 1325 Last data filed at 09/23/2020 0629 Gross per 24 hour  Intake 434 ml  Output 1400 ml  Net -966 ml   Filed Weights   09/21/20 1545  Weight: 90.7 kg    Examination:  General exam: Appears calm and comfortable, anxious, upset  Respiratory system:  Clear to auscultation. Respiratory effort normal. Cardiovascular system: S1 & S2 heard, RRR. No JVD, murmurs, rubs, gallops or clicks. No pedal edema. Gastrointestinal system: Abdomen is nondistended, soft and nontender. No organomegaly or masses felt. Normal bowel sounds heard. Central nervous system: Alert and oriented. No focal neurological deficits. Extremities:  No edema, no cyanosis, no clubbing. Skin: No rashes, lesions or ulcers Psychiatry: Judgement and insight appear normal. Mood & affect appropriate.     Data Reviewed: I have personally reviewed following labs and imaging studies  CBC: Recent Labs  Lab 09/21/20 1605 09/22/20 0611 09/23/20 0405  WBC 12.0* 12.7* 9.8  NEUTROABS 6.5  --   --   HGB 15.7 15.0 14.4  HCT 47.1 45.0 43.2  MCV 96.3 97.2 98.9  PLT 288 298 893   Basic Metabolic Panel: Recent Labs  Lab 09/21/20 1605 09/22/20 0611 09/23/20 0405  NA 136 135 134*  K 3.7 4.2 4.0  CL 101 99 102  CO2 24 24 22   GLUCOSE 98 91 131*  BUN 15 19 20   CREATININE 1.76* 1.79* 1.87*  CALCIUM 9.5 9.3 9.0   GFR: Estimated Creatinine Clearance: 49.4 mL/min (A) (by C-G formula based on SCr of 1.87 mg/dL (H)). Liver Function Tests: Recent Labs  Lab 09/21/20 1605  AST 29  ALT 22  ALKPHOS 105  BILITOT 0.9  PROT 7.9  ALBUMIN 3.8   No results for input(s): LIPASE, AMYLASE in the last 168 hours. No results for input(s): AMMONIA in the last 168 hours. Coagulation Profile: No results for input(s): INR, PROTIME in the last 168 hours. Cardiac Enzymes: No results for input(s): CKTOTAL, CKMB, CKMBINDEX, TROPONINI in the last 168 hours. BNP (last 3 results) No results for input(s): PROBNP in the last 8760 hours. HbA1C: Recent Labs    09/22/20 0611  HGBA1C 5.3   CBG: No results for input(s): GLUCAP in the last 168 hours. Lipid Profile: Recent Labs    09/22/20 0611  CHOL 169  HDL 27*  LDLCALC 102*  TRIG 201*  CHOLHDL 6.3   Thyroid Function Tests: No results  for input(s): TSH, T4TOTAL, FREET4, T3FREE, THYROIDAB in the last 72 hours. Anemia Panel: No results for input(s): VITAMINB12, FOLATE, FERRITIN, TIBC, IRON, RETICCTPCT in the last 72 hours. Sepsis Labs: No results for input(s): PROCALCITON, LATICACIDVEN in the last 168 hours.  Recent Results (from the past 240 hour(s))  Respiratory Panel by RT PCR (Flu A&B, Covid) - Nasopharyngeal Swab     Status: None   Collection Time: 09/22/20  2:38 AM   Specimen: Nasopharyngeal Swab  Result Value Ref Range Status   SARS Coronavirus 2 by RT PCR NEGATIVE NEGATIVE Final    Comment: (NOTE) SARS-CoV-2 target nucleic acids are NOT DETECTED.  The SARS-CoV-2 RNA is generally detectable in upper respiratoy specimens during the acute phase of infection. The lowest concentration of SARS-CoV-2 viral copies this assay can detect is 131 copies/mL. A negative result does not preclude SARS-Cov-2 infection and should not be used as the sole basis for treatment or other patient management decisions.  A negative result may occur with  improper specimen collection/handling, submission of specimen other than nasopharyngeal swab, presence of viral mutation(s) within the areas targeted by this assay, and inadequate number of viral copies (<131 copies/mL). A negative result must be combined with clinical observations, patient history, and epidemiological information. The expected result is Negative.  Fact Sheet for Patients:  PinkCheek.be  Fact Sheet for Healthcare Providers:  GravelBags.it  This test is no t yet approved or cleared by the [place] FDA and  has been authorized for detection and/or diagnosis of SARS-CoV-2 by FDA under an Emergency Use Authorization (EUA). This EUA will remain  in effect (meaning this test can be used) for the duration of the COVID-19 declaration under Section 564(b)(1) of the Act, 21 U.S.C. section 360bbb-3(b)(1), unless  the authorization is terminated or revoked sooner.     Influenza A by PCR NEGATIVE NEGATIVE Final   Influenza B by PCR NEGATIVE NEGATIVE Final    Comment: (NOTE) The Xpert Xpress SARS-CoV-2/FLU/RSV assay is intended as an aid in  the diagnosis of influenza from Nasopharyngeal swab specimens and  should not be used as a sole basis for treatment. Nasal washings and  aspirates are unacceptable for Xpert Xpress SARS-CoV-2/FLU/RSV  testing.  Fact Sheet for Patients: PinkCheek.be  Fact Sheet for Healthcare Providers: GravelBags.it  This test is not yet approved or cleared by the [place] FDA and  has been authorized for detection and/or diagnosis of SARS-CoV-2 by  FDA under an Emergency Use Authorization (EUA). This EUA will remain  in effect (meaning this test can be used) for the duration of the  Covid-19 declaration under Section 564(b)(1) of the Act, 21  U.S.C. section 360bbb-3(b)(1), unless the authorization is  terminated or revoked. Performed at [facility], [place] [address] [place], [place] [address]      Radiology Studies: CT Head Wo Contrast  Result Date: 09/21/2020 CLINICAL DATA:  Dizziness and leg weakness for the past few days. EXAM: CT HEAD WITHOUT CONTRAST TECHNIQUE: Contiguous axial images were obtained from the base of the skull through the vertex without intravenous contrast. COMPARISON:  None. FINDINGS: Brain: Cortical and subcortical hypodensity with loss of the normal gray-white matter differentiation in the posterior right parietal lobe, concerning for acute infarct. Small old lacunar infarct in the left cerebellum. No evidence of hemorrhage, hydrocephalus, extra-axial collection or mass lesion/mass effect. Vascular: No hyperdense vessel or unexpected calcification. Skull: Normal. Negative for fracture or focal lesion. Sinuses/Orbits: No acute finding. Other: None. IMPRESSION: 1. Findings concerning  for acute infarct in the posterior right parietal lobe. No hemorrhage. 2. Small old lacunar infarct in the left cerebellum. Electronically Signed   By: Titus Dubin M.D.   On: 09/21/2020 17:14   MR BRAIN W WO CONTRAST  Result Date: 09/22/2020 EXAM: MRI HEAD WITHOUT AND WITH CONTRAST TECHNIQUE: Multiplanar, multiecho pulse sequences of the brain and surrounding structures were obtained without and with intravenous contrast. CONTRAST:  38mL GADAVIST GADOBUTROL 1 MMOL/ML IV SOLN COMPARISON:  Prior CT from earlier the same day. FINDINGS: Brain: Multiple scattered enhancing lesions are seen throughout the brain, consistent with intracranial metastatic disease. These are seen as follows: - dominant and most prominent of these lesions is positioned at the parasagittal right parietal lobe in measures 2.0 x 1.5 x 1.8 cm (series 23, image 35). Associated susceptibility artifact compatible with hemorrhage and/or necrosis. Localized vasogenic edema without significant regional mass effect or midline shift. Several additional subcentimeter lesions are seen as follows: -  4 mm lesion at the subcortical anterior left frontal lobe (series 23, image 37) - punctate lesion at the deep white matter of the right frontal lobe (series 24, image 23). - additional possible 4 mm lesion at the subcortical anterior right frontal lobe (series 24, image 25). - 6 mm rim enhancing lesion at the mid left temporal lobe (series 25, image 21). - 4 mm lesion at the right occipital lobe (series 23, image 23). Approximate 6 lesions are seen in total. Underlying cerebral volume within normal limits. No significant cerebral white matter disease. Small remote lacunar infarcts noted at the thalami bilaterally. Additional small focus of encephalomalacia without associated enhancement at the peripheral left cerebellum consistent with a chronic ischemic infarct as well (series 15, image 7). 4 mm focus of restricted diffusion at the subcortical right  occipital lobe consistent with a small acute ischemic infarct (series 9, image 33). No associated hemorrhage or mass effect. Additional subtle diffusion abnormality involving the cortical gray matter of the high left frontal lobe also consistent with an acute ischemic infarct (series 9, images 91, 89). w additional subtle 7 mm focus of diffusion abnormality involving the right occipital pole without associated enhancement also suspicious for a small acute ischemic infarct (series 9, image 67). No other evidence for acute or subacute ischemia. Gray-white matter differentiation otherwise maintained. No midline shift or hydrocephalus. No extra-axial fluid collection. Pituitary gland suprasellar region within normal limits. Midline structures intact. Vascular: Major intracranial vascular flow voids are maintained. Skull and upper cervical spine: Craniocervical junction within normal limits. Metastatic lesion involving the left occipital condyle noted (series 13, image 15). No other definite osseous lesions about the calvarium. Scalp soft tissues within normal limits. Sinuses/Orbits: Globes and orbital soft tissues within normal limits. Mild scattered mucosal thickening noted within the ethmoidal air cells. Paranasal sinuses are otherwise largely clear. No significant mastoid effusion. Inner ear structures within normal limits. Other: None. IMPRESSION: 1. Multiple scattered enhancing lesions throughout the brain, consistent with intracranial metastatic disease. The dominant lesion is positioned at the right parietal lobe and measures up to 2 cm. Localized vasogenic edema without significant regional mass effect about this lesion. Remainder of the lesions are subcentimeter in size. 2. Few scattered subcentimeter acute ischemic nonhemorrhagic infarcts involving the left frontal lobe, right periatrial white matter, and right occipital lobe as above. 3. Osseous metastatic lesion involving the left occipital condyle. 4. Small  chronic ischemic infarcts involving the bilateral thalami and left cerebellum. Findings communicated by telephone at the time of interpretation on 09/21/2020 at 11:55 p.m. to provider Arlean Hopping, who verbally acknowledged these results. Electronically Signed   By: Jeannine Boga M.D.   On: 09/22/2020 00:24   MR CERVICAL SPINE W WO CONTRAST  Result Date: 09/22/2020 CLINICAL DATA:  Initial evaluation for lower extremity weakness. EXAM: MRI CERVICAL SPINE WITHOUT AND WITH CONTRAST TECHNIQUE: Multiplanar and multiecho pulse sequences of the cervical spine, to include the craniocervical junction and cervicothoracic junction, were obtained without and with intravenous contrast. CONTRAST:  70mL GADAVIST GADOBUTROL 1 MMOL/ML IV SOLN COMPARISON:  None available. FINDINGS: Alignment: Straightening of the normal cervical lordosis. Trace anterolisthesis of C7 on T1, chronic and facet mediated. Vertebrae: Osseous metastatic implant seen involving the left occipital condyle. Additional osseous metastasis involves the posterior aspect of the C5 vertebral body. Metastatic lesion largely replaces the C6 vertebral body. Additional metastases seen involving the right posterior elements of C7 and T1. Additional signal abnormality involving the anterior aspect of C4 could reflect an additional  lesion versus reactive degenerative change. No associated pathologic fracture. No significant extra osseous extension of tumor in the cervical spine. Cord: Normal signal and morphology. No intramedullary or epidural tumor. No abnormal enhancement. Posterior Fossa, vertebral arteries, paraspinal tissues: 9 mm metastatic soft tissue implant seen involving the subcutaneous fat along the midline of the posterior cervical spine (series 36, image 5). Additional 1.5 cm soft tissue implant involves the right posterior paraspinous musculature at the level of C7 (series 38, image 33). Smaller 1 cm soft tissue implant involving the right posterior  paraspinous musculature at C5-6 (series 38, image 25). Abnormal right supraclavicular adenopathy consistent with nodal metastatic disease, with the dominant node measuring 2.9 x 1.8 cm (series 38, image 33). Disc levels: C2-C3: Shallow central disc protrusion minimally indents the ventral thecal sac. Mild left-sided facet hypertrophy. No spinal stenosis. Foramina remain patent. C3-C4: Central disc osteophyte complex indents the ventral thecal sac, partially effacing the ventral CSF. Moderate left-sided facet hypertrophy with bilateral uncovertebral spurring. Resultant mild spinal stenosis without significant cord deformity. Severe left with moderate right C4 foraminal narrowing. C4-C5: Small central disc protrusion indents the ventral thecal sac. No significant stenosis or cord deformity. Superimposed mild left sided facet hypertrophy with uncovertebral spurring. Resultant moderate left worse than right C5 foraminal stenosis. C5-C6: Disc desiccation without disc bulge. Mild uncovertebral hypertrophy on the right. No spinal stenosis. Foramina remain patent. C6-C7: Mild disc bulge with right greater than left uncovertebral spurring. No spinal stenosis. Severe right with moderate left C7 foraminal narrowing. C7-T1: Trace anterolisthesis. Minimal disc bulge with bilateral uncovertebral hypertrophy. Moderate left with mild right facet degeneration. No significant spinal stenosis. Moderate right C8 foraminal stenosis. No significant left foraminal encroachment. IMPRESSION: 1. 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 No associated pathologic fracture or extraosseous extension. 2. 9 mm metastatic soft tissue implant involving the subcutaneous fat along the midline of the posterior cervical spine. Additional 1 cm in 1.5 cm soft tissue implants involving the right  posterior paraspinous musculature at the levels of C5-6 and C7. 3. Abnormal right supraclavicular adenopathy, consistent with nodal metastatic disease. 4. Multilevel cervical spondylosis with resultant mild spinal stenosis at C3-4. Moderate to severe bilateral C4, C5, C7, and right C8 foraminal stenosis as above. Findings communicated by telephone at the time of interpretation on 09/21/2020 at 11:55 p.m. to provider Arlean Hopping, who verbally acknowledged these results. Electronically Signed   By: Jeannine Boga M.D.   On: 09/22/2020 00:42   MR THORACIC SPINE W WO CONTRAST  Result Date: 09/22/2020 CLINICAL DATA:  Initial evaluation for acute lower extremity weakness. EXAM: MRI THORACIC WITHOUT AND WITH CONTRAST TECHNIQUE: Multiplanar and multiecho pulse sequences of the thoracic spine were obtained without and with intravenous contrast. CONTRAST:  39mL GADAVIST GADOBUTROL 1 MMOL/ML IV SOLN COMPARISON:  None available. FINDINGS: MRI THORACIC SPINE FINDINGS Alignment: Mild dextroscoliosis. Alignment otherwise normal with preservation of the normal thoracic kyphosis. No listhesis. Vertebrae: Widespread osseous metastatic disease seen throughout the thoracic spine, with prominent lesion seen involving the posterior elements of T3, as well as the T5, T6, T7, T8, T9, and T12 vertebral bodies. Involvement is greatest at the level of T7 where there is an associated pathologic fracture. Associated height loss of up to approximate 50%. Associated extraosseous extension with enhancing tumor seen extending into the ventral and right epidural space (series 42, image 38).  Slight cephalad extension of epidural tumor to the levels of T6 and T8 inferiorly. Superimposed prominence of the dorsal epidural fat. Associated severe spinal stenosis with compression/impingement of the thoracic spinal cord at this level. No appreciable cord signal changes at this time. Tumor also extends into the bilateral T6-7 and T7-8 neural foramina.  Extension into the right anterolateral paraspinous soft tissues also noted at the level of T7 (series 39, image 1). There is additional epidural extension of tumor into the ventral epidural space at the level of T9 with no more than mild spinal stenosis (series 42, image 50). No other significant epidural tumor. Extraosseous extension of tumor also noted about the T3 spinous process metastasis (series 39, image 8). Abnormal enhancement within the inter spinous region at T7-8 also consistent with extraosseous tumor (series 39, image 8). Cord: Ventral epidural tumor at the levels of T7 and T9 as above. Associated cord impingement without cord signal changes at the level of T7. Signal intensity within the thoracic spinal cord is otherwise normal. Paraspinal and other soft tissues: Paraspinous tumor centered about the level of T7. Additional extraosseous tumor about the spinous processes of T3 and T7. Large right perihilar mass and/or adenopathy partially visualized within the lungs (series 42, image 26). Extensive mediastinal and right supraclavicular adenopathy. Additional scattered bilateral pulmonary nodules concerning for metastatic disease. Few scattered lesions within the liver measuring up to approximately 2 cm concerning for intrahepatic metastases. Disc levels: No significant underlying degenerative disc disease within the thoracic spine. No other high-grade stenosis or impingement. IMPRESSION: 1. 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 2. Associated pathologic fracture of T7 with up to 50% height loss. Extra osseous extension with epidural tumor at this level results in severe spinal stenosis and compression/impingement of the thoracic spinal cord. No cord signal changes at this time. 3. Additional mild epidural extension of tumor into the ventral epidural space at the level of T9 with no more than mild spinal stenosis. 4. Large right perihilar mass and/or  adenopathy, raising the possibility for a primary lung malignancy. Additional extensive mediastinal and supraclavicular adenopathy with widespread pulmonary nodules concerning for metastatic disease. Further evaluation with dedicated cross-sectional imaging of the chest recommended. 5. Few scattered lesions within the liver, concerning for intrahepatic metastases. Findings communicated by telephone at the time of interpretation on 09/21/2020 at 11:55 p.m. to provider Arlean Hopping, who verbally acknowledged these results. Electronically Signed   By: Jeannine Boga M.D.   On: 09/22/2020 01:02   MR Lumbar Spine W Wo Contrast  Result Date: 09/22/2020 CLINICAL DATA:  Initial evaluation for acute lower extremity weakness. EXAM: MRI LUMBAR SPINE WITHOUT AND WITH CONTRAST TECHNIQUE: Multiplanar and multiecho pulse sequences of the lumbar spine were obtained without and with intravenous contrast. CONTRAST:  46mL GADAVIST GADOBUTROL 1 MMOL/ML IV SOLN COMPARISON:  None available. FINDINGS: Segmentation:  Standard. Alignment:  Physiologic. Vertebrae: Scattered osseous metastatic disease seen throughout the lumbar spine and sacrum. Most prominent involvement seen at the level of L3 which is largely replaced by a metastatic lesion. Associated pathologic fracture with up to 25% height loss without bony retropulsion. Additional metastatic lesion involves the left transverse process of L2 (series 11, image 14). Few additional lesions noted about the sacrum. No significant extra osseous extension or epidural tumor. Conus medullaris and cauda equina: Conus extends to the T12-L1 level. Conus and cauda equina appear normal. Paraspinal and other soft tissues: 4.6 cm metastatic soft tissue implants seen within the left posterior paraspinous  musculature at the level of L4 (series 32, image 14). Few additional subcentimeter nodular densities about the retroperitoneal fat suspicious for small metastases as well (series 35, image 5 on  the left, image 14 on the right). Visualized visceral structures otherwise unremarkable. Disc levels: L1-2: Normal interspace. Moderate left with mild right facet hypertrophy. No stenosis. L2-3: Mild disc bulge with disc desiccation. Moderate right worse than left facet hypertrophy. No significant spinal stenosis. Mild right foraminal narrowing. L3-4: Mild diffuse disc bulge with disc desiccation. Superimposed right foraminal to extraforaminal disc protrusion contacts the exiting right L3 nerve root (series 13, image 23). Severe right with moderate left facet hypertrophy. Resultant mild narrowing of the lateral recesses bilaterally. Mild bilateral L3 foraminal stenosis. L4-5: Mild disc bulge. Superimposed shallow right foraminal to extraforaminal disc protrusion with annular fissure, contacting the exiting right L4 nerve root (series 13, image 29). Moderate left worse than right facet hypertrophy. Resultant mild canal with bilateral subarticular stenosis. Mild bilateral L4 foraminal narrowing. L5-S1: Mild disc bulge. Associated annular fissure at the level of the right neural foramen. Moderate right with mild left facet hypertrophy. No significant spinal stenosis. Mild right foraminal narrowing. IMPRESSION: 1. Scattered osseous metastatic disease throughout the lumbar spine and sacrum as above. Associated pathologic fracture at the level of L3 with up to 25% height loss without bony retropulsion. No epidural or intracanalicular tumor. 2. 4.6 cm metastatic soft tissue implants within the left posterior paraspinous musculature at the level of L4. 3. Right foraminal to extraforaminal disc protrusions at L3-4 and L4-5, contacting and potentially irritating the exiting right L3 and L4 nerve roots respectively. 4. Underlying moderate to advanced multilevel facet hypertrophy as detailed above. Electronically Signed   By: Jeannine Boga M.D.   On: 09/22/2020 01:12   MR SACRUM SI JOINTS W WO CONTRAST  Result Date:  09/22/2020 CLINICAL DATA:  Initial evaluation for acute lower extremity weakness. EXAM: MRI SACRUM WITHOUT AND WITH CONTRAST TECHNIQUE: Multiplanar multi-sequence MR imaging of the sacrum was performed with and without IV contrast. 9 cc of Gadavist was administered. COMPARISON:  None available. FINDINGS: Urinary Tract: Partially distended bladder moderately distended without abnormality. Bowel: Visualized bowel within normal limits without evidence for obstruction or inflammation. Vascular/Lymphatic: Normal vascular flow void seen throughout the visualized pelvis. No appreciable adenopathy. Reproductive:  Unremarkable. Other: Metastatic soft tissue implant measuring approximately 1.8 cm partially visualize within the right gluteal musculature (series 40, image 14). Additional 2 cm implant seen more superiorly within the right gluteal musculature (series 40, image 4). Musculoskeletal: Dominant metastases seen involving the right sacrum at the level of the S2 segment. Lesion measures approximately 3.5 x 4.3 x 4.1 cm (series 40, image 14). Early extraosseous extension of tumor into the adjacent right S2 foramen without frank neural impingement (series 41, image 13). Additional 2.2 cm metastasis noted more superiorly and anteriorly at the right sacral ala (series 40, image 16). 4.4 cm osseous metastasis involves the right iliac wing (series 40, image 18). No associated pathologic fracture. No other significant extra osseous extension of tumor. IMPRESSION: 1. 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 Early extraosseous extension into the adjacent right S2 foramen without frank neural impingement. 2. Additional osseous metastases involving the right sacral ala and right iliac wing as above. No pathologic fracture or other complication. 3. Metastatic soft tissue implants within the right gluteal musculature as above. Electronically Signed   By: Jeannine Boga M.D.   On:  09/22/2020 01:19  CT CHEST ABDOMEN PELVIS W CONTRAST  Result Date: 09/22/2020 CLINICAL DATA:  Evaluate for neoplasm. EXAM: CT CHEST, ABDOMEN, AND PELVIS WITH CONTRAST TECHNIQUE: Multidetector CT imaging of the chest, abdomen and pelvis was performed following the standard protocol during bolus administration of intravenous contrast. CONTRAST:  5mL OMNIPAQUE IOHEXOL 300 MG/ML  SOLN COMPARISON:  None FINDINGS: CT CHEST FINDINGS Cardiovascular: The heart size appears normal. No pericardial effusion identified. Aortic atherosclerosis. Mediastinum/Nodes: Normal appearance of the thyroid gland. The trachea appears patent and is midline. Normal appearance of the esophagus. Extensive thoracic adenopathy identified compatible with nodal metastases. Right supraclavicular lymph node measures 1.6 cm, image 5/3 Nodal mass within the right paratracheal region measures 7.3 x 5.2 by 6.8 cm, image 16/3. This encases and narrows the SVC and left innominate vein. Pre-vascular lymph node anterior to the ascending aorta measures 2.7 cm, image 22/3. Subcarinal lymph node measures 2.7 cm, image 29/3. Right perihilar nodal mass measures 3.0 by 3.5 by 4.2 cm, image 27/3 and image 78/5. Left AP window lymph node measures 1.3 cm, image 21/3. Prominent left hilar lymph nodes are also noted which measure up to 1.1 cm. Lungs/Pleura: Centrilobular and paraseptal emphysema. No pleural effusion identified. Within the posteromedial right upper lobe there is a lung mass which is contiguous with the right hilum. This measures 3.8 x 2.8 by 3.6 cm, image 50/4 and image 92/5. Innumerable lung nodules are scattered throughout both lungs compatible with diffuse metastases. Index nodule in the superior segment of left lower lobe measures 1.1 cm, image 62/4 Subpleural nodule in the posteromedial right lower lobe measures 1.4 cm, image 80/4. Central left upper lobe lung nodule measures 0.8 cm, image 65/4. Musculoskeletal: Scattered lucent bone lesions  are identified within the bony thorax. These are better seen on the MRI from 09/21/2020. Pathologic compression fracture noted at T7 with loss of 50% of the vertebral body height. Again seen are signs of extra osseous extension with epidural tumor at this level. CT ABDOMEN PELVIS FINDINGS Hepatobiliary: Diffuse liver metastases identified. Index lesion within the subcapsular aspect of segment 7 measures 2.1 cm, image 44/3. Index lesion within segment 4 B measures 1.8 cm, image 47/3. Caudate lobe lesion measures 1.8 cm, image 50/3. Gallbladder is unremarkable.  No biliary ductal dilatation. Pancreas: Unremarkable. No pancreatic ductal dilatation or surrounding inflammatory changes. Spleen: Normal in size without focal abnormality. Adrenals/Urinary Tract: Normal appearance of the adrenal glands. Mild bilateral renal cortical volume loss. No suspicious mass or hydronephrosis identified. Urinary bladder is unremarkable. Stomach/Bowel: There is moderate distension of the stomach. No bowel wall thickening, inflammation, or distension. Vascular/Lymphatic: Aortic atherosclerosis. No aneurysm. No abdominopelvic adenopathy. Reproductive: Prostate is unremarkable. Other: No ascites or focal fluid collections. Signs of peritoneal metastases identified. Index lesion adjacent to the transverse colon within the left upper quadrant measures 2.5 cm, image 53/3. Musculoskeletal: Osseous metastases within the lumbar spine and sacrum are better demonstrated on recent MRI from 07/22/2020. The pathologic fracture involving the L3 vertebral body is again noted and appears unchanged, image 88/6. the left paraspinous intra muscular metastasis is noted measuring 4 cm, image 92/3. IMPRESSION: 1. Right upper lobe lung mass is identified which is contiguous with the right hilum and is concerning for primary bronchogenic carcinoma. 2. Bilateral pulmonary nodularity compatible with metastatic disease. 3. Extensive thoracic and abdominal  adenopathy compatible with nodal metastases. There is encasement and narrowing of the superior vena cava and left innominate vein. 4. Diffuse liver metastases. 5. Signs of peritoneal metastases. 6. Osseous metastases within  the bony thorax, lumbar spine and sacrum are better demonstrated on the recent MRI from 09/21/2020. Pathologic fractures are noted involving the T7 and L3 vertebra. 7. 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 8. Emphysema and aortic atherosclerosis. Aortic Atherosclerosis (ICD10-I70.0) and Emphysema (ICD10-J43.9). Electronically Signed   By: Kerby Moors M.D.   On: 09/22/2020 09:30   ECHOCARDIOGRAM COMPLETE  Result Date: 09/22/2020    ECHOCARDIOGRAM REPORT   Patient Name:   Tyler Barker Date of Exam: 09/22/2020 Medical Rec #:  [id number]     Height:       70.0 in Accession #:    [id number]    Weight:       200.0 lb Date of Birth:  October 31, 1961    BSA:          2.087 m Patient Age:    27 years      BP:           113/72 mmHg Patient Gender: M             HR:           68 bpm. Exam Location:  Inpatient Procedure: 2D Echo Indications:    stroke 434.91  History:        Patient has no prior history of Echocardiogram examinations.                 Risk Factors:Current Smoker.  Sonographer:    Jannett Celestine RDCS (AE) Referring Phys: [id number] TIMOTHY S OPYD  Sonographer Comments: Image acquisition challenging due to respiratory motion. see comments regarding exam. off axis windows IMPRESSIONS  1. Left ventricular ejection fraction, by estimation, is 60 to 65%. The left ventricle has normal function. The left ventricle has no regional wall motion abnormalities. There is mild left ventricular hypertrophy. Left ventricular diastolic parameters were normal.  2. Right ventricular systolic function is normal. The right ventricular size is normal.  3. The mitral valve is normal in structure. No evidence of mitral valve regurgitation. No evidence of mitral stenosis.  4. The  aortic valve has an indeterminant number of cusps. Aortic valve regurgitation is not visualized. No aortic stenosis is present.  5. The inferior vena cava is normal in size with greater than 50% respiratory variability, suggesting right atrial pressure of 3 mmHg. FINDINGS  Left Ventricle: Left ventricular ejection fraction, by estimation, is 60 to 65%. The left ventricle has normal function. The left ventricle has no regional wall motion abnormalities. The left ventricular internal cavity size was normal in size. There is  mild left ventricular hypertrophy. Left ventricular diastolic parameters were normal. Right Ventricle: The right ventricular size is normal. No increase in right ventricular wall thickness. Right ventricular systolic function is normal. Left Atrium: Left atrial size was normal in size. Right Atrium: Right atrial size was normal in size. Pericardium: There is no evidence of pericardial effusion. Mitral Valve: The mitral valve is normal in structure. No evidence of mitral valve regurgitation. No evidence of mitral valve stenosis. Tricuspid Valve: The tricuspid valve is normal in structure. Tricuspid valve regurgitation is not demonstrated. No evidence of tricuspid stenosis. Aortic Valve: The aortic valve has an indeterminant number of cusps. Aortic valve regurgitation is not visualized. No aortic stenosis is present. Aortic valve mean gradient measures 3.4 mmHg. Aortic valve peak gradient measures 6.9 mmHg. Aortic valve area, by VTI measures 4.15 cm. Pulmonic Valve: The pulmonic valve was not well visualized. Pulmonic valve regurgitation is not visualized. No evidence of pulmonic stenosis.  Aorta: The aortic root is normal in size and structure. Pulmonary Artery: Indeterminant PASP, inadequate TR jet. Venous: The inferior vena cava was not well visualized. The inferior vena cava is normal in size with greater than 50% respiratory variability, suggesting right atrial pressure of 3 mmHg. IAS/Shunts:  The interatrial septum was not well visualized.  LEFT VENTRICLE PLAX 2D LVIDd:         3.80 cm  Diastology LVIDs:         2.40 cm  LV e' medial:    9.68 cm/s LV PW:         1.10 cm  LV E/e' medial:  6.6 LV IVS:        1.20 cm  LV e' lateral:   11.90 cm/s LVOT diam:     2.20 cm  LV E/e' lateral: 5.4 LV SV:         81 LV SV Index:   39 LVOT Area:     3.80 cm  RIGHT VENTRICLE RV S prime:     14.60 cm/s TAPSE (M-mode): 2.2 cm LEFT ATRIUM         Index LA diam:    3.60 cm 1.72 cm/m  AORTIC VALVE AV Area (Vmax):    3.70 cm AV Area (Vmean):   3.44 cm AV Area (VTI):     4.15 cm AV Vmax:           131.67 cm/s AV Vmean:          87.607 cm/s AV VTI:            0.194 m AV Peak Grad:      6.9 mmHg AV Mean Grad:      3.4 mmHg LVOT Vmax:         128.00 cm/s LVOT Vmean:        79.200 cm/s LVOT VTI:          0.212 m LVOT/AV VTI ratio: 1.09  AORTA Ao Root diam: 3.30 cm MITRAL VALVE MV Area (PHT): 3.46 cm    SHUNTS MV Decel Time: 219 msec    Systemic VTI:  0.21 m MV E velocity: 64.30 cm/s  Systemic Diam: 2.20 cm MV A velocity: 68.60 cm/s MV E/A ratio:  0.94 Carlyle Dolly MD Electronically signed by Carlyle Dolly MD Signature Date/Time: 09/22/2020/1:41:07 PM    Final    VAS US CAROTID (at [facility] and [facility] only)  Result Date: 09/22/2020 Carotid Arterial Duplex Study Indications:       CVA and Back pain, difficulty walking. Risk Factors:      Current smoker. 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 Comparison Study:  No prior study Performing Technologist: Sharion Dove RVS  Examination Guidelines: A complete evaluation includes B-mode imaging, spectral Doppler, color Doppler, and power Doppler as needed of all accessible portions of each vessel. Bilateral testing is considered an integral part of a complete examination. Limited examinations for reoccurring indications may be performed as noted.  Right Carotid Findings:  +----------+--------+--------+--------+------------------+------------------+           PSV cm/sEDV cm/sStenosisPlaque DescriptionComments           +----------+--------+--------+--------+------------------+------------------+ CCA Prox  105     26  intimal thickening +----------+--------+--------+--------+------------------+------------------+ CCA Distal106     36                                intimal thickening +----------+--------+--------+--------+------------------+------------------+ ICA Prox  82      27              heterogenous                         +----------+--------+--------+--------+------------------+------------------+ ICA Distal95      34                                                   +----------+--------+--------+--------+------------------+------------------+ ECA       157     31                                                   +----------+--------+--------+--------+------------------+------------------+ +----------+--------+-------+--------+-------------------+           PSV cm/sEDV cmsDescribeArm Pressure (mmHG) +----------+--------+-------+--------+-------------------+ [id number]                                        +----------+--------+-------+--------+-------------------+ +---------+--------+--+--------+--+ VertebralPSV cm/s27EDV cm/s16 +---------+--------+--+--------+--+  Left Carotid Findings: +----------+--------+--------+--------+------------------+------------------+           PSV cm/sEDV cm/sStenosisPlaque DescriptionComments           +----------+--------+--------+--------+------------------+------------------+ CCA Prox  104     34                                intimal thickening +----------+--------+--------+--------+------------------+------------------+ CCA Distal116     37                                intimal thickening  +----------+--------+--------+--------+------------------+------------------+ ICA Prox  105     38              heterogenous                         +----------+--------+--------+--------+------------------+------------------+ ICA Distal105     29                                                   +----------+--------+--------+--------+------------------+------------------+ ECA       121     21                                                   +----------+--------+--------+--------+------------------+------------------+ +----------+--------+--------+--------+-------------------+           PSV cm/sEDV cm/sDescribeArm Pressure (mmHG) +----------+--------+--------+--------+-------------------+ [id number]                                          +----------+--------+--------+--------+-------------------+ +---------+--------+--+--------+--+  VertebralPSV cm/s52EDV cm/s19 +---------+--------+--+--------+--+   Summary: Right Carotid: The extracranial vessels were near-normal with only minimal wall                thickening or plaque. Left Carotid: The extracranial vessels were near-normal with only minimal wall               thickening or plaque. Vertebrals:  Bilateral vertebral arteries demonstrate antegrade flow. Subclavians: Normal flow hemodynamics were seen in bilateral subclavian              arteries. *See table(s) above for measurements and observations.     Preliminary     Scheduled Meds: .  stroke: mapping our early stages of recovery book   Does not apply Once  . aspirin  300 mg Rectal Daily   Or  . aspirin  325 mg Oral Daily  . dexamethasone (DECADRON) injection  4 mg Intravenous Q12H  . enoxaparin (LOVENOX) injection  40 mg Subcutaneous Daily   Continuous Infusions:   LOS: 1 day    Time spent: 35 mins    Alekai Pocock, MD [facility]   If 7PM-7AM, please contact night-coverage

## 2020-09-23 NOTE — ED Notes (Signed)
Report given to [facility].

## 2020-09-23 NOTE — ED Notes (Signed)
Pt is currently resting with eyes closed. Unable to assess pain level at this time.

## 2020-09-23 NOTE — Consult Note (Signed)
Reason for Consult: Widely metastatic disease. Referring Physician: Emergency department  Tyler Barker is an 59 y.o. male.  HPI: 59 year old male presents with a 2-day history of bilateral lower extremity weakness.  No history of trauma.  Patient notes no history consistent with seizure.  States that he has had weakness in his lower extremities with falling.  He has some back pain.  He has lower abdominal discomfort and feelings of pressure.  He has diminished sensation from his mid chest distally.  He remains continent.  Work-up is demonstrated evidence of a suspicious chest mass consistent with lung carcinoma.  He has imaging which demonstrates widely metastatic disease including metastasis within his lung and liver.  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  Patient has evidence of a T7 pathologic fracture with moderate collapse and some retropulsion with some early ventral compression of the spinal cord but no significant constriction or severe compression of the cord.  Past Medical History:  Diagnosis Date  . Medical history non-contributory     Past Surgical History:  Procedure Laterality Date  . APPENDECTOMY  1979    Family History  Problem Relation Age of Onset  . Diabetes Maternal Aunt   . Alcoholism Paternal Grandmother     Social History:  reports that he has been smoking. He has been smoking about 1.00 pack per day. He has never used smokeless tobacco. He reports previous alcohol use. He reports previous drug use.  Allergies: No Known Allergies  Medications: I have reviewed the patient's current medications.  Results for orders placed or performed during the hospital encounter of 09/21/20 (from the past 48 hour(s))  Respiratory Panel by RT PCR (Flu A&B, Covid) - Nasopharyngeal Swab     Status: None   Collection Time: 09/22/20  2:38 AM   Specimen: Nasopharyngeal Swab  Result Value Ref Range   SARS  Coronavirus 2 by RT PCR NEGATIVE NEGATIVE    Comment: (NOTE) SARS-CoV-2 target nucleic acids are NOT DETECTED.  The SARS-CoV-2 RNA is generally detectable in upper respiratoy specimens during the acute phase of infection. The lowest concentration of SARS-CoV-2 viral copies this assay can detect is 131 copies/mL. A negative result does not preclude SARS-Cov-2 infection and should not be used as the sole basis for treatment or other patient management decisions. A negative result may occur with  improper specimen collection/handling, submission of specimen other than nasopharyngeal swab, presence of viral mutation(s) within the areas targeted by this assay, and inadequate number of viral copies (<131 copies/mL). A negative result must be combined with clinical observations, patient history, and epidemiological information. The expected result is Negative.  Fact Sheet for Patients:  PinkCheek.be  Fact Sheet for Healthcare Providers:  GravelBags.it  This test is no t yet approved or cleared by the [place] FDA and  has been authorized for detection and/or diagnosis of SARS-CoV-2 by FDA under an Emergency Use Authorization (EUA). This EUA will remain  in effect (meaning this test can be used) for the duration of the COVID-19 declaration under Section 564(b)(1) of the Act, 21 U.S.C. section 360bbb-3(b)(1), unless the authorization is terminated or revoked sooner.     Influenza A by PCR NEGATIVE NEGATIVE   Influenza B by PCR NEGATIVE NEGATIVE    Comment: (NOTE) The Xpert Xpress SARS-CoV-2/FLU/RSV assay is intended as an aid in  the diagnosis of influenza from Nasopharyngeal swab specimens and  should not be used as a sole basis for treatment.  Nasal washings and  aspirates are unacceptable for Xpert Xpress SARS-CoV-2/FLU/RSV  testing.  Fact Sheet for Patients: PinkCheek.be  Fact Sheet for  Healthcare Providers: GravelBags.it  This test is not yet approved or cleared by the [place] FDA and  has been authorized for detection and/or diagnosis of SARS-CoV-2 by  FDA under an Emergency Use Authorization (EUA). This EUA will remain  in effect (meaning this test can be used) for the duration of the  Covid-19 declaration under Section 564(b)(1) of the Act, 21  U.S.C. section 360bbb-3(b)(1), unless the authorization is  terminated or revoked. Performed at [facility], [place] [address] [place], [place] [address]   HIV Antibody (routine testing w rflx)     Status: None   Collection Time: 09/22/20  6:11 AM  Result Value Ref Range   HIV Screen 4th Generation wRfx Non Reactive Non Reactive    Comment: Performed at [facility], [place] [address], [place], [place] [address]  Hemoglobin A1c     Status: None   Collection Time: 09/22/20  6:11 AM  Result Value Ref Range   Hgb A1c MFr Bld 5.3 4.8 - 5.6 %    Comment: (NOTE) Pre diabetes:          5.7%-6.4%  Diabetes:              >6.4%  Glycemic control for   <7.0% adults with diabetes    Mean Plasma Glucose 105.41 mg/dL    Comment: Performed at [facility] [address] [place], [place] [address]  Lipid panel     Status: Abnormal   Collection Time: 09/22/20  6:11 AM  Result Value Ref Range   Cholesterol 169 0 - 200 mg/dL   Triglycerides 201 (H) <150 mg/dL   HDL 27 (L) >40 mg/dL   Total CHOL/HDL Ratio 6.3 RATIO   VLDL 40 0 - 40 mg/dL   LDL Cholesterol 102 (H) 0 - 99 mg/dL    Comment:        Total Cholesterol/HDL:CHD Risk Coronary Heart Disease Risk Table                     Men   Women  1/2 Average Risk   3.4   3.3  Average Risk       5.0   4.4  2 X Average Risk   9.6   7.1  3 X Average Risk  23.4   11.0        Use the calculated Patient Ratio above and the CHD Risk Table to determine the patient's CHD Risk.        ATP III CLASSIFICATION (LDL):  <100     mg/dL    Optimal  100-129  mg/dL   Near or Above                    Optimal  130-159  mg/dL   Borderline  160-189  mg/dL   High  >190     mg/dL   Very High Performed at [facility] [address], [place] [address]   CBC     Status: Abnormal   Collection Time: 09/22/20  6:11 AM  Result Value Ref Range   WBC 12.7 (H) 4.0 - 10.5 K/uL   RBC 4.63 4.22 - 5.81 MIL/uL   Hemoglobin 15.0 13.0 - 17.0 g/dL   HCT 45.0 39 - 52 %   MCV 97.2 80.0 -  100.0 fL   MCH 32.4 26.0 - 34.0 pg   MCHC 33.3 30.0 - 36.0 g/dL   RDW 12.8 11.5 - 15.5 %   Platelets 298 150 - 400 K/uL   nRBC 0.0 0.0 - 0.2 %    Comment: Performed at [facility], [place] [address] [place], [place] [address]  Basic metabolic panel     Status: Abnormal   Collection Time: 09/22/20  6:11 AM  Result Value Ref Range   Sodium 135 135 - 145 mmol/L   Potassium 4.2 3.5 - 5.1 mmol/L   Chloride 99 98 - 111 mmol/L   CO2 24 22 - 32 mmol/L   Glucose, Bld 91 70 - 99 mg/dL    Comment: Glucose reference range applies only to samples taken after fasting for at least 8 hours.   BUN 19 6 - 20 mg/dL   Creatinine, Ser 1.79 (H) 0.61 - 1.24 mg/dL   Calcium 9.3 8.9 - 10.3 mg/dL   GFR, Estimated 44 (L) >60 mL/min    Comment: (NOTE) Calculated using the CKD-EPI Creatinine Equation (2021)    Anion gap 12 5 - 15    Comment: Performed at [facility] [address], [place], [place] [address]  Urinalysis, Complete w Microscopic Urine, Random     Status: Abnormal   Collection Time: 09/22/20  4:07 PM  Result Value Ref Range   Color, Urine YELLOW YELLOW   APPearance CLEAR CLEAR   Specific Gravity, Urine 1.030 1.005 - 1.030   pH 5.0 5.0 - 8.0   Glucose, UA NEGATIVE NEGATIVE mg/dL   Hgb urine dipstick SMALL (A) NEGATIVE   Bilirubin Urine NEGATIVE NEGATIVE   Ketones, ur 5 (A) NEGATIVE mg/dL   Protein, ur NEGATIVE NEGATIVE mg/dL   Nitrite NEGATIVE NEGATIVE   Leukocytes,Ua NEGATIVE NEGATIVE   RBC / HPF 6-10 0 - 5 RBC/hpf   WBC, UA  0-5 0 - 5 WBC/hpf   Bacteria, UA NONE SEEN NONE SEEN   Squamous Epithelial / LPF 0-5 0 - 5   Mucus PRESENT     Comment: Performed at [facility], [place] [address] [place], [place] [address]  Sodium, urine, random     Status: None   Collection Time: 09/22/20  4:07 PM  Result Value Ref Range   Sodium, Ur 25 mmol/L    Comment: Performed at [facility] [address], [place], [place] [address]  Creatinine, urine, random     Status: None   Collection Time: 09/22/20  4:07 PM  Result Value Ref Range   Creatinine, Urine 142.72 mg/dL    Comment: Performed at [facility] [address], [place] [address]  CBC     Status: None   Collection Time: 09/23/20  4:05 AM  Result Value Ref Range   WBC 9.8 4.0 - 10.5 K/uL   RBC 4.37 4.22 - 5.81 MIL/uL   Hemoglobin 14.4 13.0 - 17.0 g/dL   HCT 43.2 39 - 52 %   MCV 98.9 80.0 - 100.0 fL   MCH 33.0 26.0 - 34.0 pg   MCHC 33.3 30.0 - 36.0 g/dL   RDW 12.6 11.5 - 15.5 %   Platelets 284 150 - 400 K/uL   nRBC 0.0 0.0 - 0.2 %    Comment: Performed at [facility], [place] [address] [place],  [address]  Basic metabolic panel     Status: Abnormal   Collection Time: 09/23/20  4:05  AM  Result Value Ref Range   Sodium 134 (L) 135 - 145 mmol/L   Potassium 4.0 3.5 - 5.1 mmol/L   Chloride 102 98 - 111 mmol/L   CO2 22 22 - 32 mmol/L   Glucose, Bld 131 (H) 70 - 99 mg/dL    Comment: Glucose reference range applies only to samples taken after fasting for at least 8 hours.   BUN 20 6 - 20 mg/dL   Creatinine, Ser 1.87 (H) 0.61 - 1.24 mg/dL   Calcium 9.0 8.9 - 10.3 mg/dL   GFR, Estimated 41 (L) >60 mL/min    Comment: (NOTE) Calculated using the CKD-EPI Creatinine Equation (2021)    Anion gap 10 5 - 15    Comment: Performed at [facility] [address], [place], [place] [address]    MR BRAIN W WO CONTRAST  Result Date: 09/22/2020 EXAM: MRI HEAD WITHOUT AND WITH CONTRAST TECHNIQUE: Multiplanar, multiecho pulse  sequences of the brain and surrounding structures were obtained without and with intravenous contrast. CONTRAST:  58mL GADAVIST GADOBUTROL 1 MMOL/ML IV SOLN COMPARISON:  Prior CT from earlier the same day. FINDINGS: Brain: Multiple scattered enhancing lesions are seen throughout the brain, consistent with intracranial metastatic disease. These are seen as follows: - dominant and most prominent of these lesions is positioned at the parasagittal right parietal lobe in measures 2.0 x 1.5 x 1.8 cm (series 23, image 35). Associated susceptibility artifact compatible with hemorrhage and/or necrosis. Localized vasogenic edema without significant regional mass effect or midline shift. Several additional subcentimeter lesions are seen as follows: - 4 mm lesion at the subcortical anterior left frontal lobe (series 23, image 37) - punctate lesion at the deep white matter of the right frontal lobe (series 24, image 23). - additional possible 4 mm lesion at the subcortical anterior right frontal lobe (series 24, image 25). - 6 mm rim enhancing lesion at the mid left temporal lobe (series 25, image 21). - 4 mm lesion at the right occipital lobe (series 23, image 23). Approximate 6 lesions are seen in total. Underlying cerebral volume within normal limits. No significant cerebral white matter disease. Small remote lacunar infarcts noted at the thalami bilaterally. Additional small focus of encephalomalacia without associated enhancement at the peripheral left cerebellum consistent with a chronic ischemic infarct as well (series 15, image 7). 4 mm focus of restricted diffusion at the subcortical right occipital lobe consistent with a small acute ischemic infarct (series 9, image 33). No associated hemorrhage or mass effect. Additional subtle diffusion abnormality involving the cortical gray matter of the high left frontal lobe also consistent with an acute ischemic infarct (series 9, images 91, 89). w additional subtle 7 mm focus of  diffusion abnormality involving the right occipital pole without associated enhancement also suspicious for a small acute ischemic infarct (series 9, image 67). No other evidence for acute or subacute ischemia. Gray-white matter differentiation otherwise maintained. No midline shift or hydrocephalus. No extra-axial fluid collection. Pituitary gland suprasellar region within normal limits. Midline structures intact. Vascular: Major intracranial vascular flow voids are maintained. Skull and upper cervical spine: Craniocervical junction within normal limits. Metastatic lesion involving the left occipital condyle noted (series 13, image 15). No other definite osseous lesions about the calvarium. Scalp soft tissues within normal limits. Sinuses/Orbits: Globes and orbital soft tissues within normal limits. Mild scattered mucosal thickening noted within the ethmoidal air cells. Paranasal sinuses are otherwise largely clear. No significant mastoid effusion. Inner ear structures within normal limits.  Other: None. IMPRESSION: 1. Multiple scattered enhancing lesions throughout the brain, consistent with intracranial metastatic disease. The dominant lesion is positioned at the right parietal lobe and measures up to 2 cm. Localized vasogenic edema without significant regional mass effect about this lesion. Remainder of the lesions are subcentimeter in size. 2. Few scattered subcentimeter acute ischemic nonhemorrhagic infarcts involving the left frontal lobe, right periatrial white matter, and right occipital lobe as above. 3. Osseous metastatic lesion involving the left occipital condyle. 4. 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 Findings communicated by telephone at the time of interpretation on 09/21/2020 at 11:55 p.m. to provider Tyler Barker, who verbally acknowledged these results. Electronically Signed   By: Tyler Barker M.D.   On: 09/22/2020 00:24   MR CERVICAL SPINE W  WO CONTRAST  Result Date: 09/22/2020 CLINICAL DATA:  Initial evaluation for lower extremity weakness. EXAM: MRI CERVICAL SPINE WITHOUT AND WITH CONTRAST TECHNIQUE: Multiplanar and multiecho pulse sequences of the cervical spine, to include the craniocervical junction and cervicothoracic junction, were obtained without and with intravenous contrast. CONTRAST:  110mL GADAVIST GADOBUTROL 1 MMOL/ML IV SOLN COMPARISON:  None available. FINDINGS: Alignment: Straightening of the normal cervical lordosis. Trace anterolisthesis of C7 on T1, chronic and facet mediated. Vertebrae: Osseous metastatic implant seen involving the left occipital condyle. Additional osseous metastasis involves the posterior aspect of the C5 vertebral body. Metastatic lesion largely replaces the C6 vertebral body. Additional metastases seen involving the right posterior elements of C7 and T1. Additional signal abnormality involving the anterior aspect of C4 could reflect an additional lesion versus reactive degenerative change. No associated pathologic fracture. No significant extra osseous extension of tumor in the cervical spine. Cord: Normal signal and morphology. No intramedullary or epidural tumor. No abnormal enhancement. Posterior Fossa, vertebral arteries, paraspinal tissues: 9 mm metastatic soft tissue implant seen involving the subcutaneous fat along the midline of the posterior cervical spine (series 36, image 5). Additional 1.5 cm soft tissue implant involves the right posterior paraspinous musculature at the level of C7 (series 38, image 33). Smaller 1 cm soft tissue implant involving the right posterior paraspinous musculature at C5-6 (series 38, image 25). Abnormal right supraclavicular adenopathy consistent with nodal metastatic disease, with the dominant node measuring 2.9 x 1.8 cm (series 38, image 33). Disc levels: C2-C3: Shallow central disc protrusion minimally indents the ventral thecal sac. Mild left-sided facet hypertrophy. No  spinal stenosis. Foramina remain patent. C3-C4: Central disc osteophyte complex indents the ventral thecal sac, partially effacing the ventral CSF. Moderate left-sided facet hypertrophy with bilateral uncovertebral spurring. Resultant mild spinal stenosis without significant cord deformity. Severe left with moderate right C4 foraminal narrowing. C4-C5: Small central disc protrusion indents the ventral thecal sac. No significant stenosis or cord deformity. Superimposed mild left sided facet hypertrophy with uncovertebral spurring. Resultant moderate left worse than right C5 foraminal stenosis. C5-C6: Disc desiccation without disc bulge. Mild uncovertebral hypertrophy on the right. No spinal stenosis. Foramina remain patent. C6-C7: Mild disc bulge with right greater than left uncovertebral spurring. No spinal stenosis. Severe right with moderate left C7 foraminal narrowing. C7-T1: Trace anterolisthesis. Minimal disc bulge with bilateral uncovertebral hypertrophy. Moderate left with mild right facet degeneration. No significant spinal stenosis. Moderate right C8 foraminal stenosis. No significant left foraminal encroachment. IMPRESSION: 1. 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 No associated  pathologic fracture or extraosseous extension. 2. 9 mm metastatic soft tissue implant involving the subcutaneous fat along the midline of the posterior cervical spine. Additional 1 cm in 1.5 cm soft tissue implants involving the right posterior paraspinous musculature at the levels of C5-6 and C7. 3. Abnormal right supraclavicular adenopathy, consistent with nodal metastatic disease. 4. Multilevel cervical spondylosis with resultant mild spinal stenosis at C3-4. Moderate to severe bilateral C4, C5, C7, and right C8 foraminal stenosis as above. Findings communicated by  telephone at the time of interpretation on 09/21/2020 at 11:55 p.m. to provider Tyler Barker, who verbally acknowledged these results. Electronically Signed   By: Tyler Barker M.D.   On: 09/22/2020 00:42   MR THORACIC SPINE W WO CONTRAST  Result Date: 09/22/2020 CLINICAL DATA:  Initial evaluation for acute lower extremity weakness. EXAM: MRI THORACIC WITHOUT AND WITH CONTRAST TECHNIQUE: Multiplanar and multiecho pulse sequences of the thoracic spine were obtained without and with intravenous contrast. CONTRAST:  65mL GADAVIST GADOBUTROL 1 MMOL/ML IV SOLN COMPARISON:  None available. FINDINGS: MRI THORACIC SPINE FINDINGS Alignment: Mild dextroscoliosis. Alignment otherwise normal with preservation of the normal thoracic kyphosis. No listhesis. Vertebrae: Widespread osseous metastatic disease seen throughout the thoracic spine, with prominent lesion seen involving the posterior elements of T3, as well as the T5, T6, T7, T8, T9, and T12 vertebral bodies. Involvement is greatest at the level of T7 where there is an associated pathologic fracture. Associated height loss of up to approximate 50%. Associated extraosseous extension with enhancing tumor seen extending into the ventral and right epidural space (series 42, image 38). Slight cephalad extension of epidural tumor to the levels of T6 and T8 inferiorly. Superimposed prominence of the dorsal epidural fat. Associated severe spinal stenosis with compression/impingement of the thoracic spinal cord at this level. No appreciable cord signal changes at this time. Tumor also extends into the bilateral T6-7 and T7-8 neural foramina. Extension into the right anterolateral paraspinous soft tissues also noted at the level of T7 (series 39, image 1). There is additional epidural extension of tumor into the ventral epidural space at the level of T9 with no more than mild spinal stenosis (series 42, image 50). No other significant epidural tumor. Extraosseous extension of  tumor also noted about the T3 spinous process metastasis (series 39, image 8). Abnormal enhancement within the inter spinous region at T7-8 also consistent with extraosseous tumor (series 39, image 8). Cord: Ventral epidural tumor at the levels of T7 and T9 as above. Associated cord impingement without cord signal changes at the level of T7. Signal intensity within the thoracic spinal cord is otherwise normal. Paraspinal and other soft tissues: Paraspinous tumor centered about the level of T7. Additional extraosseous tumor about the spinous processes of T3 and T7. Large right perihilar mass and/or adenopathy partially visualized within the lungs (series 42, image 26). Extensive mediastinal and right supraclavicular adenopathy. Additional scattered bilateral pulmonary nodules concerning for metastatic disease. Few scattered lesions within the liver measuring up to approximately 2 cm concerning for intrahepatic metastases. Disc levels: No significant underlying degenerative disc disease within the thoracic spine. No other high-grade stenosis or impingement. IMPRESSION: 1. 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 2. Associated pathologic fracture of T7 with up to 50% height loss. Extra osseous extension with epidural tumor at this level results in severe spinal stenosis and compression/impingement of the thoracic spinal cord. No cord signal changes at this time. 3. Additional mild epidural extension of tumor into the ventral  epidural space at the level of T9 with no more than mild spinal stenosis. 4. Large right perihilar mass and/or adenopathy, raising the possibility for a primary lung malignancy. Additional extensive mediastinal and supraclavicular adenopathy with widespread pulmonary nodules concerning for metastatic disease. Further evaluation with dedicated cross-sectional imaging of the chest recommended. 5. Few scattered lesions within the liver, concerning for  intrahepatic metastases. Findings communicated by telephone at the time of interpretation on 09/21/2020 at 11:55 p.m. to provider Tyler Barker, who verbally acknowledged these results. Electronically Signed   By: Tyler Barker M.D.   On: 09/22/2020 01:02   MR Lumbar Spine W Wo Contrast  Result Date: 09/22/2020 CLINICAL DATA:  Initial evaluation for acute lower extremity weakness. EXAM: MRI LUMBAR SPINE WITHOUT AND WITH CONTRAST TECHNIQUE: Multiplanar and multiecho pulse sequences of the lumbar spine were obtained without and with intravenous contrast. CONTRAST:  68mL GADAVIST GADOBUTROL 1 MMOL/ML IV SOLN COMPARISON:  None available. FINDINGS: Segmentation:  Standard. Alignment:  Physiologic. Vertebrae: Scattered osseous metastatic disease seen throughout the lumbar spine and sacrum. Most prominent involvement seen at the level of L3 which is largely replaced by a metastatic lesion. Associated pathologic fracture with up to 25% height loss without bony retropulsion. Additional metastatic lesion involves the left transverse process of L2 (series 11, image 14). Few additional lesions noted about the sacrum. No significant extra osseous extension or epidural tumor. Conus medullaris and cauda equina: Conus extends to the T12-L1 level. Conus and cauda equina appear normal. Paraspinal and other soft tissues: 4.6 cm metastatic soft tissue implants seen within the left posterior paraspinous musculature at the level of L4 (series 32, image 14). Few additional subcentimeter nodular densities about the retroperitoneal fat suspicious for small metastases as well (series 35, image 5 on the left, image 14 on the right). Visualized visceral structures otherwise unremarkable. Disc levels: L1-2: Normal interspace. Moderate left with mild right facet hypertrophy. No stenosis. L2-3: Mild disc bulge with disc desiccation. Moderate right worse than left facet hypertrophy. No significant spinal stenosis. Mild right foraminal  narrowing. L3-4: Mild diffuse disc bulge with disc desiccation. Superimposed right foraminal to extraforaminal disc protrusion contacts the exiting right L3 nerve root (series 13, image 23). Severe right with moderate left facet hypertrophy. Resultant mild narrowing of the lateral recesses bilaterally. Mild bilateral L3 foraminal stenosis. L4-5: Mild disc bulge. Superimposed shallow right foraminal to extraforaminal disc protrusion with annular fissure, contacting the exiting right L4 nerve root (series 13, image 29). Moderate left worse than right facet hypertrophy. Resultant mild canal with bilateral subarticular stenosis. Mild bilateral L4 foraminal narrowing. L5-S1: Mild disc bulge. Associated annular fissure at the level of the right neural foramen. Moderate right with mild left facet hypertrophy. No significant spinal stenosis. Mild right foraminal narrowing. IMPRESSION: 1. Scattered osseous metastatic disease throughout the lumbar spine and sacrum as above. Associated pathologic fracture at the level of L3 with up to 25% height loss without bony retropulsion. No epidural or intracanalicular tumor. 2. 4.6 cm metastatic soft tissue implants within the left posterior paraspinous musculature at the level of L4. 3. Right foraminal to extraforaminal disc protrusions at L3-4 and L4-5, contacting and potentially irritating the exiting right L3 and L4 nerve roots respectively. 4. Underlying moderate to advanced multilevel facet hypertrophy as detailed above. Electronically Signed   By: Tyler Barker M.D.   On: 09/22/2020 01:12   MR SACRUM SI JOINTS W WO CONTRAST  Result Date: 09/22/2020 CLINICAL DATA:  Initial evaluation for acute lower extremity weakness. EXAM:  MRI SACRUM WITHOUT AND WITH CONTRAST TECHNIQUE: Multiplanar multi-sequence MR imaging of the sacrum was performed with and without IV contrast. 9 cc of Gadavist was administered. COMPARISON:  None available. FINDINGS: Urinary Tract: Partially  distended bladder moderately distended without abnormality. Bowel: Visualized bowel within normal limits without evidence for obstruction or inflammation. Vascular/Lymphatic: Normal vascular flow void seen throughout the visualized pelvis. No appreciable adenopathy. Reproductive:  Unremarkable. Other: Metastatic soft tissue implant measuring approximately 1.8 cm partially visualize within the right gluteal musculature (series 40, image 14). Additional 2 cm implant seen more superiorly within the right gluteal musculature (series 40, image 4). Musculoskeletal: Dominant metastases seen involving the right sacrum at the level of the S2 segment. Lesion measures approximately 3.5 x 4.3 x 4.1 cm (series 40, image 14). Early extraosseous extension of tumor into the adjacent right S2 foramen without frank neural impingement (series 41, image 13). Additional 2.2 cm metastasis noted more superiorly and anteriorly at the right sacral ala (series 40, image 16). 4.4 cm osseous metastasis involves the right iliac wing (series 40, image 18). No associated pathologic fracture. No other significant extra osseous extension of tumor. IMPRESSION: 1. 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 Early extraosseous extension into the adjacent right S2 foramen without frank neural impingement. 2. Additional osseous metastases involving the right sacral ala and right iliac wing as above. No pathologic fracture or other complication. 3. Metastatic soft tissue implants within the right gluteal musculature as above. Electronically Signed   By: Tyler Barker M.D.   On: 09/22/2020 01:19   CT CHEST ABDOMEN PELVIS W CONTRAST  Result Date: 09/22/2020 CLINICAL DATA:  Evaluate for neoplasm. EXAM: CT CHEST, ABDOMEN, AND PELVIS WITH CONTRAST TECHNIQUE: Multidetector CT imaging of the chest, abdomen and pelvis was performed following the standard protocol during bolus administration of intravenous  contrast. CONTRAST:  72mL OMNIPAQUE IOHEXOL 300 MG/ML  SOLN COMPARISON:  None FINDINGS: CT CHEST FINDINGS Cardiovascular: The heart size appears normal. No pericardial effusion identified. Aortic atherosclerosis. Mediastinum/Nodes: Normal appearance of the thyroid gland. The trachea appears patent and is midline. Normal appearance of the esophagus. Extensive thoracic adenopathy identified compatible with nodal metastases. Right supraclavicular lymph node measures 1.6 cm, image 5/3 Nodal mass within the right paratracheal region measures 7.3 x 5.2 by 6.8 cm, image 16/3. This encases and narrows the SVC and left innominate vein. Pre-vascular lymph node anterior to the ascending aorta measures 2.7 cm, image 22/3. Subcarinal lymph node measures 2.7 cm, image 29/3. Right perihilar nodal mass measures 3.0 by 3.5 by 4.2 cm, image 27/3 and image 78/5. Left AP window lymph node measures 1.3 cm, image 21/3. Prominent left hilar lymph nodes are also noted which measure up to 1.1 cm. Lungs/Pleura: Centrilobular and paraseptal emphysema. No pleural effusion identified. Within the posteromedial right upper lobe there is a lung mass which is contiguous with the right hilum. This measures 3.8 x 2.8 by 3.6 cm, image 50/4 and image 92/5. Innumerable lung nodules are scattered throughout both lungs compatible with diffuse metastases. Index nodule in the superior segment of left lower lobe measures 1.1 cm, image 62/4 Subpleural nodule in the posteromedial right lower lobe measures 1.4 cm, image 80/4. Central left upper lobe lung nodule measures 0.8 cm, image 65/4. Musculoskeletal: Scattered lucent bone lesions are identified within the bony thorax. These are better seen on the MRI from 09/21/2020. Pathologic compression fracture noted at T7 with loss of 50% of the vertebral body height. Again seen  are signs of extra osseous extension with epidural tumor at this level. CT ABDOMEN PELVIS FINDINGS Hepatobiliary: Diffuse liver metastases  identified. Index lesion within the subcapsular aspect of segment 7 measures 2.1 cm, image 44/3. Index lesion within segment 4 B measures 1.8 cm, image 47/3. Caudate lobe lesion measures 1.8 cm, image 50/3. Gallbladder is unremarkable.  No biliary ductal dilatation. Pancreas: Unremarkable. No pancreatic ductal dilatation or surrounding inflammatory changes. Spleen: Normal in size without focal abnormality. Adrenals/Urinary Tract: Normal appearance of the adrenal glands. Mild bilateral renal cortical volume loss. No suspicious mass or hydronephrosis identified. Urinary bladder is unremarkable. Stomach/Bowel: There is moderate distension of the stomach. No bowel wall thickening, inflammation, or distension. Vascular/Lymphatic: Aortic atherosclerosis. No aneurysm. No abdominopelvic adenopathy. Reproductive: Prostate is unremarkable. Other: No ascites or focal fluid collections. Signs of peritoneal metastases identified. Index lesion adjacent to the transverse colon within the left upper quadrant measures 2.5 cm, image 53/3. Musculoskeletal: Osseous metastases within the lumbar spine and sacrum are better demonstrated on recent MRI from 07/22/2020. The pathologic fracture involving the L3 vertebral body is again noted and appears unchanged, image 88/6. the left paraspinous intra muscular metastasis is noted measuring 4 cm, image 92/3. IMPRESSION: 1. Right upper lobe lung mass is identified which is contiguous with the right hilum and is concerning for primary bronchogenic carcinoma. 2. Bilateral pulmonary nodularity compatible with metastatic disease. 3. Extensive thoracic and abdominal adenopathy compatible with nodal metastases. There is encasement and narrowing of the superior vena cava and left innominate vein. 4. Diffuse liver metastases. 5. Signs of peritoneal metastases. 6. Osseous metastases within the bony thorax, lumbar spine and sacrum are better demonstrated on the recent MRI from 09/21/2020. Pathologic  fractures are noted involving the T7 and L3 vertebra. 7. 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 8. Emphysema and aortic atherosclerosis. Aortic Atherosclerosis (ICD10-I70.0) and Emphysema (ICD10-J43.9). Electronically Signed   By: Tyler Barker M.D.   On: 09/22/2020 09:30   ECHOCARDIOGRAM COMPLETE  Result Date: 09/22/2020    ECHOCARDIOGRAM REPORT   Patient Name:   Tyler Barker Date of Exam: 09/22/2020 Medical Rec #:  [id number]     Height:       70.0 in Accession #:    [id number]    Weight:       200.0 lb Date of Birth:  06/01/1961    BSA:          2.087 m Patient Age:    64 years      BP:           113/72 mmHg Patient Gender: M             HR:           68 bpm. Exam Location:  Inpatient Procedure: 2D Echo Indications:    stroke 434.91  History:        Patient has no prior history of Echocardiogram examinations.                 Risk Factors:Current Smoker.  Sonographer:    Jannett Celestine RDCS (AE) Referring Phys: [id number] Tyler Barker  Sonographer Comments: Image acquisition challenging due to respiratory motion. see comments regarding exam. off axis windows IMPRESSIONS  1. Left ventricular ejection fraction, by estimation, is 60 to 65%. The left ventricle has normal function. The left ventricle has no regional wall motion abnormalities. There is mild left ventricular hypertrophy. Left ventricular diastolic parameters were normal.  2. Right ventricular systolic function is normal. The  right ventricular size is normal.  3. The mitral valve is normal in structure. No evidence of mitral valve regurgitation. No evidence of mitral stenosis.  4. The aortic valve has an indeterminant number of cusps. Aortic valve regurgitation is not visualized. No aortic stenosis is present.  5. The inferior vena cava is normal in size with greater than 50% respiratory variability, suggesting right atrial pressure of 3 mmHg. FINDINGS  Left Ventricle: Left ventricular ejection fraction, by  estimation, is 60 to 65%. The left ventricle has normal function. The left ventricle has no regional wall motion abnormalities. The left ventricular internal cavity size was normal in size. There is  mild left ventricular hypertrophy. Left ventricular diastolic parameters were normal. Right Ventricle: The right ventricular size is normal. No increase in right ventricular wall thickness. Right ventricular systolic function is normal. Left Atrium: Left atrial size was normal in size. Right Atrium: Right atrial size was normal in size. Pericardium: There is no evidence of pericardial effusion. Mitral Valve: The mitral valve is normal in structure. No evidence of mitral valve regurgitation. No evidence of mitral valve stenosis. Tricuspid Valve: The tricuspid valve is normal in structure. Tricuspid valve regurgitation is not demonstrated. No evidence of tricuspid stenosis. Aortic Valve: The aortic valve has an indeterminant number of cusps. Aortic valve regurgitation is not visualized. No aortic stenosis is present. Aortic valve mean gradient measures 3.4 mmHg. Aortic valve peak gradient measures 6.9 mmHg. Aortic valve area, by VTI measures 4.15 cm. Pulmonic Valve: The pulmonic valve was not well visualized. Pulmonic valve regurgitation is not visualized. No evidence of pulmonic stenosis. Aorta: The aortic root is normal in size and structure. Pulmonary Artery: Indeterminant PASP, inadequate TR jet. Venous: The inferior vena cava was not well visualized. The inferior vena cava is normal in size with greater than 50% respiratory variability, suggesting right atrial pressure of 3 mmHg. IAS/Shunts: The interatrial septum was not well visualized.  LEFT VENTRICLE PLAX 2D LVIDd:         3.80 cm  Diastology LVIDs:         2.40 cm  LV e' medial:    9.68 cm/s LV PW:         1.10 cm  LV E/e' medial:  6.6 LV IVS:        1.20 cm  LV e' lateral:   11.90 cm/s LVOT diam:     2.20 cm  LV E/e' lateral: 5.4 LV SV:         81 LV SV Index:    39 LVOT Area:     3.80 cm  RIGHT VENTRICLE RV S prime:     14.60 cm/s TAPSE (M-mode): 2.2 cm LEFT ATRIUM         Index LA diam:    3.60 cm 1.72 cm/m  AORTIC VALVE AV Area (Vmax):    3.70 cm AV Area (Vmean):   3.44 cm AV Area (VTI):     4.15 cm AV Vmax:           131.67 cm/s AV Vmean:          87.607 cm/s AV VTI:            0.194 m AV Peak Grad:      6.9 mmHg AV Mean Grad:      3.4 mmHg LVOT Vmax:         128.00 cm/s LVOT Vmean:        79.200 cm/s LVOT VTI:  0.212 m LVOT/AV VTI ratio: 1.09  AORTA Ao Root diam: 3.30 cm MITRAL VALVE MV Area (PHT): 3.46 cm    SHUNTS MV Decel Time: 219 msec    Systemic VTI:  0.21 m MV E velocity: 64.30 cm/s  Systemic Diam: 2.20 cm MV A velocity: 68.60 cm/s MV E/A ratio:  0.94 Tyler Dolly MD Electronically signed by Tyler Dolly MD Signature Date/Time: 09/22/2020/1:41:07 PM    Final    [place] CORE BIOPSY (LYMPH NODES)  Result Date: 09/23/2020 INDICATION: No known primary, now with concern for metastatic lung cancer. Please perform ultrasound-guided biopsy right supraclavicular lymph node for tissue diagnostic purposes. EXAM: ULTRASOUND-GUIDED RIGHT SUPRACLAVICULAR LYMPH NODE BIOPSY COMPARISON:  CT the chest, abdomen and pelvis-09/22/2020 MEDICATIONS: None ANESTHESIA/SEDATION: Moderate (conscious) sedation was employed during this procedure. A total of Versed 1.5 mg and Fentanyl 50 mcg was administered intravenously. Moderate Sedation Time: 10 minutes. The patient's level of consciousness and vital signs were monitored continuously by radiology nursing throughout the procedure under my direct supervision. COMPLICATIONS: None immediate. TECHNIQUE: Informed written consent was obtained from the patient after a discussion of the risks, benefits and alternatives to treatment. Questions regarding the procedure were encouraged and answered. Initial ultrasound scanning demonstrated an approximately 2.4 x 1.6 cm right supraclavicular lymph node (image 2) correlating with the  dominant right supraclavicular lymph node seen on preceding chest CT image 5, series 3. An ultrasound image was saved for documentation purposes. The procedure was planned. A timeout was performed prior to the initiation of the procedure. The operative was prepped and draped in the usual sterile fashion, and a sterile drape was applied covering the operative field. A timeout was performed prior to the initiation of the procedure. Local anesthesia was provided with 1% lidocaine with epinephrine. Under direct ultrasound guidance, an 18 gauge core needle device was utilized to obtain to obtain 6 core needle biopsies of the right supraclavicular lymph node. The samples were placed in saline and submitted to pathology. The needle was removed and hemostasis was achieved with manual compression. Post procedure scan was negative for significant hematoma. A dressing was placed. The patient tolerated the procedure well without immediate postprocedural complication. IMPRESSION: Technically successful ultrasound guided biopsy of right supraclavicular lymph node. Electronically Signed   By: Tyler Barker M.D.   On: 09/23/2020 15:54   VAS US CAROTID (at [facility] and [facility] only)  Result Date: 09/23/2020 Carotid Arterial Duplex Study Indications:       CVA and Back pain, difficulty walking. Risk Factors:      Current smoker. 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 Comparison Study:  No prior study Performing Technologist: Sharion Dove RVS  Examination Guidelines: A complete evaluation includes B-mode imaging, spectral Doppler, color Doppler, and power Doppler as needed of all accessible portions of each vessel. Bilateral testing is considered an integral part of a complete examination. Limited examinations for reoccurring indications may be performed as noted.  Right Carotid Findings: +----------+--------+--------+--------+------------------+------------------+            PSV cm/sEDV cm/sStenosisPlaque DescriptionComments           +----------+--------+--------+--------+------------------+------------------+ CCA Prox  105     26  intimal thickening +----------+--------+--------+--------+------------------+------------------+ CCA Distal106     36                                intimal thickening +----------+--------+--------+--------+------------------+------------------+ ICA Prox  82      27              heterogenous                         +----------+--------+--------+--------+------------------+------------------+ ICA Distal95      34                                                   +----------+--------+--------+--------+------------------+------------------+ ECA       157     31                                                   +----------+--------+--------+--------+------------------+------------------+ +----------+--------+-------+--------+-------------------+           PSV cm/sEDV cmsDescribeArm Pressure (mmHG) +----------+--------+-------+--------+-------------------+ [id number]                                        +----------+--------+-------+--------+-------------------+ +---------+--------+--+--------+--+ VertebralPSV cm/s27EDV cm/s16 +---------+--------+--+--------+--+  Left Carotid Findings: +----------+--------+--------+--------+------------------+------------------+           PSV cm/sEDV cm/sStenosisPlaque DescriptionComments           +----------+--------+--------+--------+------------------+------------------+ CCA Prox  104     34                                intimal thickening +----------+--------+--------+--------+------------------+------------------+ CCA Distal116     37                                intimal thickening +----------+--------+--------+--------+------------------+------------------+ ICA Prox  105     38               heterogenous                         +----------+--------+--------+--------+------------------+------------------+ ICA Distal105     29                                                   +----------+--------+--------+--------+------------------+------------------+ ECA       121     21                                                   +----------+--------+--------+--------+------------------+------------------+ +----------+--------+--------+--------+-------------------+           PSV cm/sEDV cm/sDescribeArm Pressure (mmHG) +----------+--------+--------+--------+-------------------+ [id number]                                          +----------+--------+--------+--------+-------------------+ +---------+--------+--+--------+--+  VertebralPSV cm/s52EDV cm/s19 +---------+--------+--+--------+--+   Summary: Right Carotid: The extracranial vessels were near-normal with only minimal wall                thickening or plaque. Left Carotid: The extracranial vessels were near-normal with only minimal wall               thickening or plaque. Vertebrals:  Bilateral vertebral arteries demonstrate antegrade flow. Subclavians: Normal flow hemodynamics were seen in bilateral subclavian              arteries. *See table(s) above for measurements and observations.  Electronically signed by Tyler Contras MD on 09/23/2020 at 1:55:25 PM.    Final     A comprehensive review of systems was negative. Blood pressure 102/80, pulse 73, temperature 97.6 F (36.4 C), temperature source Oral, resp. rate 16, height 5\' 10"  (1.778 m), weight 90.7 kg, SpO2 97 %. Patient is awake and alert.  He is oriented and appropriate.  Speech is fluent.  His judgment insight appear intact.  Cranial nerve function normal bilaterally.  Motor examination is upper extremities 5/5 bilaterally without evidence of pronator drift.  Sensory examination with diminished sensation from his mid chest wall distally.  Motor examination of  his lower extremities with 3/5 strength proximally in his right lower extremity.  4-45 distal right lower extremity strength.  Left lower extremity with 4/5 strength.  Reflexes hypoactive.  Toes equivocal to plantar stimulation.  Assessment/Plan: Patient with widely metastatic disease likely secondary to primary lung carcinoma.  Biopsy taken today and is pending.  Patient has a significant right parieto-occipital metastasis with some mild surrounding edema.  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  I believe patient's primary problem currently is that this T7 pathologic fracture with some cord contusion from the fracture.  Currently there is not any significant ongoing cord compression.  I recommend kyphoplasty per interventional radiology as soon as possible followed by radiation therapy to his chest wall lesions.  The patient will require radiation to his brain lesions but this may be done at a later date.  Patient is from [name] I would like to return there for further care.  Once the kyphoplasty is done hopefully he will be in a situation where he can be transferred.  Tyler Barker 09/23/2020, 4:50 PM

## 2020-09-23 NOTE — Progress Notes (Addendum)
Neurology Progress Note  S: Back pain, ataxia   O: Current vital signs: BP 102/80 (BP Location: Left Arm)   Pulse 73   Temp 97.6 F (36.4 C) (Oral)   Resp 16   Ht 5\' 10"  (1.778 m)   Wt 90.7 kg   SpO2 97%   BMI 28.70 kg/m  Vital signs in last 24 hours: Temp:  [97.6 F (36.4 C)-98.8 F (37.1 C)] 97.6 F (36.4 C) (11/08 1235) Pulse Rate:  [58-86] 73 (11/08 1555) Resp:  [12-21] 16 (11/08 1555) BP: (90-133)/(44-107) 102/80 (11/08 1555) SpO2:  [95 %-100 %] 97 % (11/08 1555)   GENERAL: Awake, alert in NAD HEENT: Normocephalic and atraumatic, dry mm, no LN++, no Thyromegaly LUNGS: Clear to auscultation bilaterally with no wheezes CV: S1S2 RRR, no m/r/g, equal pulses bilaterally. ABDOMEN: Soft, nontender, nondistended with normoactive BS Ext: warm, well perfused, intact peripheral pulses  NEURO:  Mental Status: AA&Ox3  Language: speech is clear and fluent.   Naming, repetition, fluency, and comprehension intact. Cranial Nerves: PERRL 53mm/brisk. EOMI, visual fields full, no facial asymmetry,bilat facial sensation intact, hearing intact, tongue/uvula/soft palate midline, normal sternocleidomastoid and trapezius muscle strength. No evidence of tongue atrophy or fibrillations Motor: 5/5 throughout Tone: is normal and bulk is normal Sensation- Intact to light touch bilaterally Coordination: FTN intact bilaterally Gait- deferred   Medications  Current Facility-Administered Medications:  .   stroke: mapping our early stages of recovery book, , Does not apply, Once, Opyd, Ilene Qua, MD .  acetaminophen (TYLENOL) tablet 650 mg, 650 mg, Oral, Q4H PRN **OR** acetaminophen (TYLENOL) 160 MG/5ML solution 650 mg, 650 mg, Per Tube, Q4H PRN **OR** acetaminophen (TYLENOL) suppository 650 mg, 650 mg, Rectal, Q4H PRN, Opyd, Timothy S, MD .  aspirin suppository 300 mg, 300 mg, Rectal, Daily **OR** aspirin tablet 325 mg, 325 mg, Oral, Daily, Opyd, Ilene Qua, MD, 325 mg at 09/23/20 1140 .   dexamethasone (DECADRON) injection 4 mg, 4 mg, Intravenous, Q12H, Shawna Clamp, MD, 4 mg at 09/23/20 1146 .  enoxaparin (LOVENOX) injection 40 mg, 40 mg, Subcutaneous, Daily, Opyd, Ilene Qua, MD, 40 mg at 09/22/20 1012 .  fentaNYL (SUBLIMAZE) 100 MCG/2ML injection, , , ,  .  HYDROmorphone (DILAUDID) injection 0.5-1 mg, 0.5-1 mg, Intravenous, Q4H PRN, Opyd, Ilene Qua, MD, 1 mg at 09/23/20 1201 .  lidocaine-EPINEPHrine (XYLOCAINE W/EPI) 1 %-1:100000 (with pres) injection, , , ,  .  midazolam (VERSED) 2 MG/2ML injection, , , ,  .  ondansetron (ZOFRAN) injection 4 mg, 4 mg, Intravenous, Q6H PRN, Opyd, Ilene Qua, MD, 4 mg at 09/23/20 1201 .  senna-docusate (Senokot-S) tablet 1 tablet, 1 tablet, Oral, QHS PRN, Opyd, Ilene Qua, MD  Current Outpatient Medications:  .  naproxen sodium (ALEVE) 220 MG tablet, Take 220 mg by mouth 2 (two) times daily as needed (pain)., Disp: , Rfl:  Labs CBC    Component Value Date/Time   WBC 9.8 09/23/2020 0405   RBC 4.37 09/23/2020 0405   HGB 14.4 09/23/2020 0405   HCT 43.2 09/23/2020 0405   PLT 284 09/23/2020 0405   MCV 98.9 09/23/2020 0405   MCH 33.0 09/23/2020 0405   MCHC 33.3 09/23/2020 0405   RDW 12.6 09/23/2020 0405   LYMPHSABS 4.1 (H) 09/21/2020 1605   MONOABS 1.1 (H) 09/21/2020 1605   EOSABS 0.2 09/21/2020 1605   BASOSABS 0.1 09/21/2020 1605    CMP     Component Value Date/Time   NA 134 (L) 09/23/2020 0405   K 4.0 09/23/2020 0405  CL 102 09/23/2020 0405   CO2 22 09/23/2020 0405   GLUCOSE 131 (H) 09/23/2020 0405   BUN 20 09/23/2020 0405   CREATININE 1.87 (H) 09/23/2020 0405   CALCIUM 9.0 09/23/2020 0405   PROT 7.9 09/21/2020 1605   ALBUMIN 3.8 09/21/2020 1605   AST 29 09/21/2020 1605   ALT 22 09/21/2020 1605   ALKPHOS 105 09/21/2020 1605   BILITOT 0.9 09/21/2020 1605   GFRNONAA 41 (L) 09/23/2020 0405    glycosylated hemoglobin  Lipid Panel     Component Value Date/Time   CHOL 169 09/22/2020 0611   TRIG 201 (H) 09/22/2020 0611    HDL 27 (L) 09/22/2020 0611   CHOLHDL 6.3 09/22/2020 0611   VLDL 40 09/22/2020 0611   LDLCALC 102 (H) 09/22/2020 0611   Imaging I have reviewed images in epic and the results pertinent to this consultation are: 2 brain lesions, metastatic from ? 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  Impression: wide spread metastatic disease.   Recommendations: No additional recommendations per neuro. At this point in time, there is no indication for anti seizure medications. Call with questions. Continue workup for primary cancer and other consultations.   Clance Boll, MSN, APN-BC/Nurse Practitioner Pager: [phone]  Patient seen, examined discussed plan with Clance Boll, MSN, APN-BC/Nurse Practitioner. Agree with assessment and plan as documented above. I have independently reviewed the chart, obtained history, review of systems and examined the patient.  Addendum:  MRI brain 09/21/2020 did show a few punctate scattered ischemic strokes and the patient will be added to the stroke service for follow up.  Dr. Lynnae Sandhoff [phone]

## 2020-09-23 NOTE — Progress Notes (Signed)
Chief Complaint: Patient was seen in consultation today for lymphadenopathy  Referring Physician(s): Dr. Baltazar Apo  Supervising Physician: Aletta Edouard  Patient Status: [facility] - ED  History of Present Illness: Tyler Barker is a 59 y.o. male, long-time smoker, who presented to the ED with lower extremity weakness.  CT Head was negative for acute stroke, however MR Brain showed multiple scattered enhancing lesions throughout the brain concerning for metastatic disease.   CT Chest Abdomen Pelvis revealed: 1. Right upper lobe lung mass is identified which is contiguous with the right hilum and is concerning for primary bronchogenic carcinoma. 2. Bilateral pulmonary nodularity compatible with metastatic disease. 3. Extensive thoracic and abdominal adenopathy compatible with nodal metastases. There is encasement and narrowing of the superior vena cava and left innominate vein. 4. Diffuse liver metastases. 5. Signs of peritoneal metastases. 6. Osseous metastases within the bony thorax, lumbar spine and sacrum are better demonstrated on the recent MRI from 09/21/2020. Pathologic fractures are noted involving the T7 and L3 vertebra. 7. 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 8. Emphysema and aortic atherosclerosis.  IR consulted for lymph node biopsy. Case reviewed and approved by Dr. Kathlene Cote.  Patient assessed in the ED.  He is anxious and overwhelmed with his suspected diagnosis as well as many social concerns.  He confirms history as presented to the ED on arrival and states that back pain and LE weakness are his major complaint.  States he has progressive shortness of breath over time-- years, per his report.  No acute or sudden changes other than weakness. He is agreeable to biopsy today.  He is NPO.   Past Medical History:  Diagnosis Date  . Medical history non-contributory     Past Surgical History:  Procedure Laterality Date  .  APPENDECTOMY  1979    Allergies: Patient has no known allergies.  Medications: Prior to Admission medications   Medication Sig Start Date End Date Taking? Authorizing Provider  naproxen sodium (ALEVE) 220 MG tablet Take 220 mg by mouth 2 (two) times daily as needed (pain).   Yes [provider]     Family History  Problem Relation Age of Onset  . Diabetes Maternal Aunt   . Alcoholism Paternal Grandmother     Social History   Socioeconomic History  . Marital status: Widowed    Spouse name: Not on file  . Number of children: Not on file  . Years of education: Not on file  . Highest education level: Not on file  Occupational History  . Occupation: truck Geophysicist/field seismologist  Tobacco Use  . Smoking status: Current Every Day Smoker    Packs/day: 1.00  . Smokeless tobacco: Never Used  Substance and Sexual Activity  . Alcohol use: Not Currently    Comment: quit in 1988 after DUI   . Drug use: Not Currently  . Sexual activity: Not on file  Other Topics Concern  . 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 Lives in [name] where he has two daughters.    Social Determinants of Health   Financial Resource Strain:   . Difficulty of Paying Living Expenses: Not on file  Food Insecurity:   . Worried About Charity fundraiser in the Last Year: Not on file  . Ran Out of Food in the Last Year: Not on file  Transportation Needs:   . Lack of Transportation (Medical): Not on file  . Lack of  Transportation (Non-Medical): Not on file  Physical Activity:   . Days of Exercise per Week: Not on file  . Minutes of Exercise per Session: Not on file  Stress:   . Feeling of Stress : Not on file  Social Connections:   . Frequency of Communication with Friends and Family: Not on file  . Frequency of Social Gatherings with Friends and Family: Not on file  . Attends Religious Services: Not on file  . Active Member of Clubs or Organizations: Not  on file  . Attends Archivist Meetings: Not on file  . Marital Status: Not on file     Review of Systems: A 12 point ROS discussed and pertinent positives are indicated in the HPI above.  All other systems are negative.  Review of Systems  Constitutional: Positive for fatigue. Negative for fever.  Respiratory: Negative for cough and shortness of breath.   Cardiovascular: Negative for chest pain.  Gastrointestinal: Negative for abdominal pain, nausea and vomiting.  Genitourinary: Negative for dysuria.  Musculoskeletal: Positive for back pain.  Neurological: Positive for weakness.  Psychiatric/Behavioral: Negative for behavioral problems and confusion.    Vital Signs: BP 113/70   Pulse 64   Temp 98.8 F (37.1 C) (Oral)   Resp 18   Ht 5\' 10"  (1.778 m)   Wt 200 lb (90.7 kg)   SpO2 98%   BMI 28.70 kg/m   Physical Exam Vitals and nursing note reviewed.  Constitutional:      General: He is not in acute distress.    Appearance: Normal appearance. He is not ill-appearing.  HENT:     Mouth/Throat:     Mouth: Mucous membranes are moist.     Pharynx: Oropharynx is clear.  Cardiovascular:     Rate and Rhythm: Normal rate and regular rhythm.     Pulses: Normal pulses.     Heart sounds: Normal heart sounds.  Pulmonary:     Effort: Pulmonary effort is normal.  Abdominal:     General: Abdomen is flat. Bowel sounds are normal. There is no distension.     Palpations: Abdomen is soft.  Skin:    General: Skin is warm and dry.  Neurological:     General: No focal deficit present.     Mental Status: He is alert and oriented to person, place, and time. Mental status is at baseline.  Psychiatric:        Mood and Affect: Mood normal.        Behavior: Behavior normal.        Thought Content: Thought content normal.        Judgment: Judgment normal.      MD Evaluation Airway: WNL Heart: WNL Abdomen: WNL Chest/ Lungs: WNL ASA  Classification: 3 Mallampati/Airway  Score: One   Imaging: CT Head Wo Contrast  Result Date: 09/21/2020 CLINICAL DATA:  Dizziness and leg weakness for the past few days. EXAM: CT HEAD WITHOUT CONTRAST TECHNIQUE: Contiguous axial images were obtained from the base of the skull through the vertex without intravenous contrast. COMPARISON:  None. FINDINGS: Brain: Cortical and subcortical hypodensity with loss of the normal gray-white matter differentiation in the posterior right parietal lobe, concerning for acute infarct. Small old lacunar infarct in the left cerebellum. No evidence of hemorrhage, hydrocephalus, extra-axial collection or mass lesion/mass effect. Vascular: No hyperdense vessel or unexpected calcification. Skull: Normal. Negative for fracture or focal lesion. Sinuses/Orbits: No acute finding. Other: None. IMPRESSION: 1. Findings concerning for acute infarct in the  posterior right parietal lobe. No hemorrhage. 2. Small old lacunar infarct in the left cerebellum. Electronically Signed   By: Titus Dubin M.D.   On: 09/21/2020 17:14   MR BRAIN W WO CONTRAST  Result Date: 09/22/2020 EXAM: MRI HEAD WITHOUT AND WITH CONTRAST TECHNIQUE: Multiplanar, multiecho pulse sequences of the brain and surrounding structures were obtained without and with intravenous contrast. CONTRAST:  26mL GADAVIST GADOBUTROL 1 MMOL/ML IV SOLN COMPARISON:  Prior CT from earlier the same day. FINDINGS: Brain: Multiple scattered enhancing lesions are seen throughout the brain, consistent with intracranial metastatic disease. These are seen as follows: - dominant and most prominent of these lesions is positioned at the parasagittal right parietal lobe in measures 2.0 x 1.5 x 1.8 cm (series 23, image 35). Associated susceptibility artifact compatible with hemorrhage and/or necrosis. Localized vasogenic edema without significant regional mass effect or midline shift. Several additional subcentimeter lesions are seen as follows: - 4 mm lesion at the subcortical  anterior left frontal lobe (series 23, image 37) - punctate lesion at the deep white matter of the right frontal lobe (series 24, image 23). - additional possible 4 mm lesion at the subcortical anterior right frontal lobe (series 24, image 25). - 6 mm rim enhancing lesion at the mid left temporal lobe (series 25, image 21). - 4 mm lesion at the right occipital lobe (series 23, image 23). Approximate 6 lesions are seen in total. Underlying cerebral volume within normal limits. No significant cerebral white matter disease. Small remote lacunar infarcts noted at the thalami bilaterally. Additional small focus of encephalomalacia without associated enhancement at the peripheral left cerebellum consistent with a chronic ischemic infarct as well (series 15, image 7). 4 mm focus of restricted diffusion at the subcortical right occipital lobe consistent with a small acute ischemic infarct (series 9, image 33). No associated hemorrhage or mass effect. Additional subtle diffusion abnormality involving the cortical gray matter of the high left frontal lobe also consistent with an acute ischemic infarct (series 9, images 91, 89). w additional subtle 7 mm focus of diffusion abnormality involving the right occipital pole without associated enhancement also suspicious for a small acute ischemic infarct (series 9, image 67). No other evidence for acute or subacute ischemia. Gray-white matter differentiation otherwise maintained. No midline shift or hydrocephalus. No extra-axial fluid collection. Pituitary gland suprasellar region within normal limits. Midline structures intact. Vascular: Major intracranial vascular flow voids are maintained. Skull and upper cervical spine: Craniocervical junction within normal limits. Metastatic lesion involving the left occipital condyle noted (series 13, image 15). No other definite osseous lesions about the calvarium. Scalp soft tissues within normal limits. Sinuses/Orbits: Globes and orbital  soft tissues within normal limits. Mild scattered mucosal thickening noted within the ethmoidal air cells. Paranasal sinuses are otherwise largely clear. No significant mastoid effusion. Inner ear structures within normal limits. Other: None. IMPRESSION: 1. Multiple scattered enhancing lesions throughout the brain, consistent with intracranial metastatic disease. The dominant lesion is positioned at the right parietal lobe and measures up to 2 cm. Localized vasogenic edema without significant regional mass effect about this lesion. Remainder of the lesions are subcentimeter in size. 2. Few scattered subcentimeter acute ischemic nonhemorrhagic infarcts involving the left frontal lobe, right periatrial white matter, and right occipital lobe as above. 3. Osseous metastatic lesion involving the left occipital condyle. 4. 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 Findings communicated by telephone at the time of interpretation on 09/21/2020 at 11:55 p.m. to provider [facility]  Joy, who verbally acknowledged these results. Electronically Signed   By: Jeannine Boga M.D.   On: 09/22/2020 00:24   MR CERVICAL SPINE W WO CONTRAST  Result Date: 09/22/2020 CLINICAL DATA:  Initial evaluation for lower extremity weakness. EXAM: MRI CERVICAL SPINE WITHOUT AND WITH CONTRAST TECHNIQUE: Multiplanar and multiecho pulse sequences of the cervical spine, to include the craniocervical junction and cervicothoracic junction, were obtained without and with intravenous contrast. CONTRAST:  47mL GADAVIST GADOBUTROL 1 MMOL/ML IV SOLN COMPARISON:  None available. FINDINGS: Alignment: Straightening of the normal cervical lordosis. Trace anterolisthesis of C7 on T1, chronic and facet mediated. Vertebrae: Osseous metastatic implant seen involving the left occipital condyle. Additional osseous metastasis involves the posterior aspect of the C5 vertebral body. Metastatic lesion largely replaces the C6  vertebral body. Additional metastases seen involving the right posterior elements of C7 and T1. Additional signal abnormality involving the anterior aspect of C4 could reflect an additional lesion versus reactive degenerative change. No associated pathologic fracture. No significant extra osseous extension of tumor in the cervical spine. Cord: Normal signal and morphology. No intramedullary or epidural tumor. No abnormal enhancement. Posterior Fossa, vertebral arteries, paraspinal tissues: 9 mm metastatic soft tissue implant seen involving the subcutaneous fat along the midline of the posterior cervical spine (series 36, image 5). Additional 1.5 cm soft tissue implant involves the right posterior paraspinous musculature at the level of C7 (series 38, image 33). Smaller 1 cm soft tissue implant involving the right posterior paraspinous musculature at C5-6 (series 38, image 25). Abnormal right supraclavicular adenopathy consistent with nodal metastatic disease, with the dominant node measuring 2.9 x 1.8 cm (series 38, image 33). Disc levels: C2-C3: Shallow central disc protrusion minimally indents the ventral thecal sac. Mild left-sided facet hypertrophy. No spinal stenosis. Foramina remain patent. C3-C4: Central disc osteophyte complex indents the ventral thecal sac, partially effacing the ventral CSF. Moderate left-sided facet hypertrophy with bilateral uncovertebral spurring. Resultant mild spinal stenosis without significant cord deformity. Severe left with moderate right C4 foraminal narrowing. C4-C5: Small central disc protrusion indents the ventral thecal sac. No significant stenosis or cord deformity. Superimposed mild left sided facet hypertrophy with uncovertebral spurring. Resultant moderate left worse than right C5 foraminal stenosis. C5-C6: Disc desiccation without disc bulge. Mild uncovertebral hypertrophy on the right. No spinal stenosis. Foramina remain patent. C6-C7: Mild disc bulge with right greater  than left uncovertebral spurring. No spinal stenosis. Severe right with moderate left C7 foraminal narrowing. C7-T1: Trace anterolisthesis. Minimal disc bulge with bilateral uncovertebral hypertrophy. Moderate left with mild right facet degeneration. No significant spinal stenosis. Moderate right C8 foraminal stenosis. No significant left foraminal encroachment. IMPRESSION: 1. 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 No associated pathologic fracture or extraosseous extension. 2. 9 mm metastatic soft tissue implant involving the subcutaneous fat along the midline of the posterior cervical spine. Additional 1 cm in 1.5 cm soft tissue implants involving the right posterior paraspinous musculature at the levels of C5-6 and C7. 3. Abnormal right supraclavicular adenopathy, consistent with nodal metastatic disease. 4. Multilevel cervical spondylosis with resultant mild spinal stenosis at C3-4. Moderate to severe bilateral C4, C5, C7, and right C8 foraminal stenosis as above. Findings communicated by telephone at the time of interpretation on 09/21/2020 at 11:55 p.m. to provider Arlean Hopping, who verbally acknowledged these results. Electronically Signed   By: Jeannine Boga  M.D.   On: 09/22/2020 00:42   MR THORACIC SPINE W WO CONTRAST  Result Date: 09/22/2020 CLINICAL DATA:  Initial evaluation for acute lower extremity weakness. EXAM: MRI THORACIC WITHOUT AND WITH CONTRAST TECHNIQUE: Multiplanar and multiecho pulse sequences of the thoracic spine were obtained without and with intravenous contrast. CONTRAST:  34mL GADAVIST GADOBUTROL 1 MMOL/ML IV SOLN COMPARISON:  None available. FINDINGS: MRI THORACIC SPINE FINDINGS Alignment: Mild dextroscoliosis. Alignment otherwise normal with preservation of the normal thoracic kyphosis. No listhesis. Vertebrae:  Widespread osseous metastatic disease seen throughout the thoracic spine, with prominent lesion seen involving the posterior elements of T3, as well as the T5, T6, T7, T8, T9, and T12 vertebral bodies. Involvement is greatest at the level of T7 where there is an associated pathologic fracture. Associated height loss of up to approximate 50%. Associated extraosseous extension with enhancing tumor seen extending into the ventral and right epidural space (series 42, image 38). Slight cephalad extension of epidural tumor to the levels of T6 and T8 inferiorly. Superimposed prominence of the dorsal epidural fat. Associated severe spinal stenosis with compression/impingement of the thoracic spinal cord at this level. No appreciable cord signal changes at this time. Tumor also extends into the bilateral T6-7 and T7-8 neural foramina. Extension into the right anterolateral paraspinous soft tissues also noted at the level of T7 (series 39, image 1). There is additional epidural extension of tumor into the ventral epidural space at the level of T9 with no more than mild spinal stenosis (series 42, image 50). No other significant epidural tumor. Extraosseous extension of tumor also noted about the T3 spinous process metastasis (series 39, image 8). Abnormal enhancement within the inter spinous region at T7-8 also consistent with extraosseous tumor (series 39, image 8). Cord: Ventral epidural tumor at the levels of T7 and T9 as above. Associated cord impingement without cord signal changes at the level of T7. Signal intensity within the thoracic spinal cord is otherwise normal. Paraspinal and other soft tissues: Paraspinous tumor centered about the level of T7. Additional extraosseous tumor about the spinous processes of T3 and T7. Large right perihilar mass and/or adenopathy partially visualized within the lungs (series 42, image 26). Extensive mediastinal and right supraclavicular adenopathy. Additional scattered bilateral  pulmonary nodules concerning for metastatic disease. Few scattered lesions within the liver measuring up to approximately 2 cm concerning for intrahepatic metastases. Disc levels: No significant underlying degenerative disc disease within the thoracic spine. No other high-grade stenosis or impingement. IMPRESSION: 1. 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 2. Associated pathologic fracture of T7 with up to 50% height loss. Extra osseous extension with epidural tumor at this level results in severe spinal stenosis and compression/impingement of the thoracic spinal cord. No cord signal changes at this time. 3. Additional mild epidural extension of tumor into the ventral epidural space at the level of T9 with no more than mild spinal stenosis. 4. Large right perihilar mass and/or adenopathy, raising the possibility for a primary lung malignancy. Additional extensive mediastinal and supraclavicular adenopathy with widespread pulmonary nodules concerning for metastatic disease. Further evaluation with dedicated cross-sectional imaging of the chest recommended. 5. Few scattered lesions within the liver, concerning for intrahepatic metastases. Findings communicated by telephone at the time of interpretation on 09/21/2020 at 11:55 p.m. to provider Arlean Hopping, who verbally acknowledged these results. Electronically Signed   By: Jeannine Boga M.D.   On: 09/22/2020 01:02   MR Lumbar Spine W Wo Contrast  Result Date:  09/22/2020 CLINICAL DATA:  Initial evaluation for acute lower extremity weakness. EXAM: MRI LUMBAR SPINE WITHOUT AND WITH CONTRAST TECHNIQUE: Multiplanar and multiecho pulse sequences of the lumbar spine were obtained without and with intravenous contrast. CONTRAST:  5mL GADAVIST GADOBUTROL 1 MMOL/ML IV SOLN COMPARISON:  None available. FINDINGS: Segmentation:  Standard. Alignment:  Physiologic. Vertebrae: Scattered osseous metastatic disease seen throughout  the lumbar spine and sacrum. Most prominent involvement seen at the level of L3 which is largely replaced by a metastatic lesion. Associated pathologic fracture with up to 25% height loss without bony retropulsion. Additional metastatic lesion involves the left transverse process of L2 (series 11, image 14). Few additional lesions noted about the sacrum. No significant extra osseous extension or epidural tumor. Conus medullaris and cauda equina: Conus extends to the T12-L1 level. Conus and cauda equina appear normal. Paraspinal and other soft tissues: 4.6 cm metastatic soft tissue implants seen within the left posterior paraspinous musculature at the level of L4 (series 32, image 14). Few additional subcentimeter nodular densities about the retroperitoneal fat suspicious for small metastases as well (series 35, image 5 on the left, image 14 on the right). Visualized visceral structures otherwise unremarkable. Disc levels: L1-2: Normal interspace. Moderate left with mild right facet hypertrophy. No stenosis. L2-3: Mild disc bulge with disc desiccation. Moderate right worse than left facet hypertrophy. No significant spinal stenosis. Mild right foraminal narrowing. L3-4: Mild diffuse disc bulge with disc desiccation. Superimposed right foraminal to extraforaminal disc protrusion contacts the exiting right L3 nerve root (series 13, image 23). Severe right with moderate left facet hypertrophy. Resultant mild narrowing of the lateral recesses bilaterally. Mild bilateral L3 foraminal stenosis. L4-5: Mild disc bulge. Superimposed shallow right foraminal to extraforaminal disc protrusion with annular fissure, contacting the exiting right L4 nerve root (series 13, image 29). Moderate left worse than right facet hypertrophy. Resultant mild canal with bilateral subarticular stenosis. Mild bilateral L4 foraminal narrowing. L5-S1: Mild disc bulge. Associated annular fissure at the level of the right neural foramen. Moderate  right with mild left facet hypertrophy. No significant spinal stenosis. Mild right foraminal narrowing. IMPRESSION: 1. Scattered osseous metastatic disease throughout the lumbar spine and sacrum as above. Associated pathologic fracture at the level of L3 with up to 25% height loss without bony retropulsion. No epidural or intracanalicular tumor. 2. 4.6 cm metastatic soft tissue implants within the left posterior paraspinous musculature at the level of L4. 3. Right foraminal to extraforaminal disc protrusions at L3-4 and L4-5, contacting and potentially irritating the exiting right L3 and L4 nerve roots respectively. 4. Underlying moderate to advanced multilevel facet hypertrophy as detailed above. Electronically Signed   By: Jeannine Boga M.D.   On: 09/22/2020 01:12   MR SACRUM SI JOINTS W WO CONTRAST  Result Date: 09/22/2020 CLINICAL DATA:  Initial evaluation for acute lower extremity weakness. EXAM: MRI SACRUM WITHOUT AND WITH CONTRAST TECHNIQUE: Multiplanar multi-sequence MR imaging of the sacrum was performed with and without IV contrast. 9 cc of Gadavist was administered. COMPARISON:  None available. FINDINGS: Urinary Tract: Partially distended bladder moderately distended without abnormality. Bowel: Visualized bowel within normal limits without evidence for obstruction or inflammation. Vascular/Lymphatic: Normal vascular flow void seen throughout the visualized pelvis. No appreciable adenopathy. Reproductive:  Unremarkable. Other: Metastatic soft tissue implant measuring approximately 1.8 cm partially visualize within the right gluteal musculature (series 40, image 14). Additional 2 cm implant seen more superiorly within the right gluteal musculature (series 40, image 4). Musculoskeletal: Dominant metastases seen involving the right  sacrum at the level of the S2 segment. Lesion measures approximately 3.5 x 4.3 x 4.1 cm (series 40, image 14). Early extraosseous extension of tumor into the adjacent  right S2 foramen without frank neural impingement (series 41, image 13). Additional 2.2 cm metastasis noted more superiorly and anteriorly at the right sacral ala (series 40, image 16). 4.4 cm osseous metastasis involves the right iliac wing (series 40, image 18). No associated pathologic fracture. No other significant extra osseous extension of tumor. IMPRESSION: 1. 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 Early extraosseous extension into the adjacent right S2 foramen without frank neural impingement. 2. Additional osseous metastases involving the right sacral ala and right iliac wing as above. No pathologic fracture or other complication. 3. Metastatic soft tissue implants within the right gluteal musculature as above. Electronically Signed   By: Jeannine Boga M.D.   On: 09/22/2020 01:19   CT CHEST ABDOMEN PELVIS W CONTRAST  Result Date: 09/22/2020 CLINICAL DATA:  Evaluate for neoplasm. EXAM: CT CHEST, ABDOMEN, AND PELVIS WITH CONTRAST TECHNIQUE: Multidetector CT imaging of the chest, abdomen and pelvis was performed following the standard protocol during bolus administration of intravenous contrast. CONTRAST:  104mL OMNIPAQUE IOHEXOL 300 MG/ML  SOLN COMPARISON:  None FINDINGS: CT CHEST FINDINGS Cardiovascular: The heart size appears normal. No pericardial effusion identified. Aortic atherosclerosis. Mediastinum/Nodes: Normal appearance of the thyroid gland. The trachea appears patent and is midline. Normal appearance of the esophagus. Extensive thoracic adenopathy identified compatible with nodal metastases. Right supraclavicular lymph node measures 1.6 cm, image 5/3 Nodal mass within the right paratracheal region measures 7.3 x 5.2 by 6.8 cm, image 16/3. This encases and narrows the SVC and left innominate vein. Pre-vascular lymph node anterior to the ascending aorta measures 2.7 cm, image 22/3. Subcarinal lymph node measures 2.7 cm, image 29/3. Right  perihilar nodal mass measures 3.0 by 3.5 by 4.2 cm, image 27/3 and image 78/5. Left AP window lymph node measures 1.3 cm, image 21/3. Prominent left hilar lymph nodes are also noted which measure up to 1.1 cm. Lungs/Pleura: Centrilobular and paraseptal emphysema. No pleural effusion identified. Within the posteromedial right upper lobe there is a lung mass which is contiguous with the right hilum. This measures 3.8 x 2.8 by 3.6 cm, image 50/4 and image 92/5. Innumerable lung nodules are scattered throughout both lungs compatible with diffuse metastases. Index nodule in the superior segment of left lower lobe measures 1.1 cm, image 62/4 Subpleural nodule in the posteromedial right lower lobe measures 1.4 cm, image 80/4. Central left upper lobe lung nodule measures 0.8 cm, image 65/4. Musculoskeletal: Scattered lucent bone lesions are identified within the bony thorax. These are better seen on the MRI from 09/21/2020. Pathologic compression fracture noted at T7 with loss of 50% of the vertebral body height. Again seen are signs of extra osseous extension with epidural tumor at this level. CT ABDOMEN PELVIS FINDINGS Hepatobiliary: Diffuse liver metastases identified. Index lesion within the subcapsular aspect of segment 7 measures 2.1 cm, image 44/3. Index lesion within segment 4 B measures 1.8 cm, image 47/3. Caudate lobe lesion measures 1.8 cm, image 50/3. Gallbladder is unremarkable.  No biliary ductal dilatation. Pancreas: Unremarkable. No pancreatic ductal dilatation or surrounding inflammatory changes. Spleen: Normal in size without focal abnormality. Adrenals/Urinary Tract: Normal appearance of the adrenal glands. Mild bilateral renal cortical volume loss. No suspicious mass or hydronephrosis identified. Urinary bladder is unremarkable. Stomach/Bowel: There is moderate distension of the stomach. No  bowel wall thickening, inflammation, or distension. Vascular/Lymphatic: Aortic atherosclerosis. No aneurysm. No  abdominopelvic adenopathy. Reproductive: Prostate is unremarkable. Other: No ascites or focal fluid collections. Signs of peritoneal metastases identified. Index lesion adjacent to the transverse colon within the left upper quadrant measures 2.5 cm, image 53/3. Musculoskeletal: Osseous metastases within the lumbar spine and sacrum are better demonstrated on recent MRI from 07/22/2020. The pathologic fracture involving the L3 vertebral body is again noted and appears unchanged, image 88/6. the left paraspinous intra muscular metastasis is noted measuring 4 cm, image 92/3. IMPRESSION: 1. Right upper lobe lung mass is identified which is contiguous with the right hilum and is concerning for primary bronchogenic carcinoma. 2. Bilateral pulmonary nodularity compatible with metastatic disease. 3. Extensive thoracic and abdominal adenopathy compatible with nodal metastases. There is encasement and narrowing of the superior vena cava and left innominate vein. 4. Diffuse liver metastases. 5. Signs of peritoneal metastases. 6. Osseous metastases within the bony thorax, lumbar spine and sacrum are better demonstrated on the recent MRI from 09/21/2020. Pathologic fractures are noted involving the T7 and L3 vertebra. 7. 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 8. Emphysema and aortic atherosclerosis. Aortic Atherosclerosis (ICD10-I70.0) and Emphysema (ICD10-J43.9). Electronically Signed   By: Kerby Moors M.D.   On: 09/22/2020 09:30   ECHOCARDIOGRAM COMPLETE  Result Date: 09/22/2020    ECHOCARDIOGRAM REPORT   Patient Name:   Tyler Barker Date of Exam: 09/22/2020 Medical Rec #:  [id number]     Height:       70.0 in Accession #:    [id number]    Weight:       200.0 lb Date of Birth:  11/17/1960    BSA:          2.087 m Patient Age:    7 years      BP:           113/72 mmHg Patient Gender: M             HR:           68 bpm. Exam Location:  Inpatient Procedure: 2D Echo Indications:    stroke  434.91  History:        Patient has no prior history of Echocardiogram examinations.                 Risk Factors:Current Smoker.  Sonographer:    Jannett Celestine RDCS (AE) Referring Phys: [id number] TIMOTHY S OPYD  Sonographer Comments: Image acquisition challenging due to respiratory motion. see comments regarding exam. off axis windows IMPRESSIONS  1. Left ventricular ejection fraction, by estimation, is 60 to 65%. The left ventricle has normal function. The left ventricle has no regional wall motion abnormalities. There is mild left ventricular hypertrophy. Left ventricular diastolic parameters were normal.  2. Right ventricular systolic function is normal. The right ventricular size is normal.  3. The mitral valve is normal in structure. No evidence of mitral valve regurgitation. No evidence of mitral stenosis.  4. The aortic valve has an indeterminant number of cusps. Aortic valve regurgitation is not visualized. No aortic stenosis is present.  5. The inferior vena cava is normal in size with greater than 50% respiratory variability, suggesting right atrial pressure of 3 mmHg. FINDINGS  Left Ventricle: Left ventricular ejection fraction, by estimation, is 60 to 65%. The left ventricle has normal function. The left ventricle has no regional wall motion abnormalities. The left ventricular internal cavity size was normal in size. There is  mild left ventricular hypertrophy. Left ventricular diastolic parameters were normal. Right Ventricle: The right ventricular size is normal. No increase in right ventricular wall thickness. Right ventricular systolic function is normal. Left Atrium: Left atrial size was normal in size. Right Atrium: Right atrial size was normal in size. Pericardium: There is no evidence of pericardial effusion. Mitral Valve: The mitral valve is normal in structure. No evidence of mitral valve regurgitation. No evidence of mitral valve stenosis. Tricuspid Valve: The tricuspid valve is normal in  structure. Tricuspid valve regurgitation is not demonstrated. No evidence of tricuspid stenosis. Aortic Valve: The aortic valve has an indeterminant number of cusps. Aortic valve regurgitation is not visualized. No aortic stenosis is present. Aortic valve mean gradient measures 3.4 mmHg. Aortic valve peak gradient measures 6.9 mmHg. Aortic valve area, by VTI measures 4.15 cm. Pulmonic Valve: The pulmonic valve was not well visualized. Pulmonic valve regurgitation is not visualized. No evidence of pulmonic stenosis. Aorta: The aortic root is normal in size and structure. Pulmonary Artery: Indeterminant PASP, inadequate TR jet. Venous: The inferior vena cava was not well visualized. The inferior vena cava is normal in size with greater than 50% respiratory variability, suggesting right atrial pressure of 3 mmHg. IAS/Shunts: The interatrial septum was not well visualized.  LEFT VENTRICLE PLAX 2D LVIDd:         3.80 cm  Diastology LVIDs:         2.40 cm  LV e' medial:    9.68 cm/s LV PW:         1.10 cm  LV E/e' medial:  6.6 LV IVS:        1.20 cm  LV e' lateral:   11.90 cm/s LVOT diam:     2.20 cm  LV E/e' lateral: 5.4 LV SV:         81 LV SV Index:   39 LVOT Area:     3.80 cm  RIGHT VENTRICLE RV S prime:     14.60 cm/s TAPSE (M-mode): 2.2 cm LEFT ATRIUM         Index LA diam:    3.60 cm 1.72 cm/m  AORTIC VALVE AV Area (Vmax):    3.70 cm AV Area (Vmean):   3.44 cm AV Area (VTI):     4.15 cm AV Vmax:           131.67 cm/s AV Vmean:          87.607 cm/s AV VTI:            0.194 m AV Peak Grad:      6.9 mmHg AV Mean Grad:      3.4 mmHg LVOT Vmax:         128.00 cm/s LVOT Vmean:        79.200 cm/s LVOT VTI:          0.212 m LVOT/AV VTI ratio: 1.09  AORTA Ao Root diam: 3.30 cm MITRAL VALVE MV Area (PHT): 3.46 cm    SHUNTS MV Decel Time: 219 msec    Systemic VTI:  0.21 m MV E velocity: 64.30 cm/s  Systemic Diam: 2.20 cm MV A velocity: 68.60 cm/s MV E/A ratio:  0.94 Carlyle Dolly MD Electronically signed by Carlyle Dolly MD Signature Date/Time: 09/22/2020/1:41:07 PM    Final    VAS US CAROTID (at [facility] and [facility] only)  Result Date: 09/22/2020 Carotid Arterial Duplex Study Indications:       CVA and Back pain, difficulty walking. Risk Factors:  Current smoker. 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 Comparison Study:  No prior study Performing Technologist: Sharion Dove RVS  Examination Guidelines: A complete evaluation includes B-mode imaging, spectral Doppler, color Doppler, and power Doppler as needed of all accessible portions of each vessel. Bilateral testing is considered an integral part of a complete examination. Limited examinations for reoccurring indications may be performed as noted.  Right Carotid Findings: +----------+--------+--------+--------+------------------+------------------+           PSV cm/sEDV cm/sStenosisPlaque DescriptionComments           +----------+--------+--------+--------+------------------+------------------+ CCA Prox  105     26                                intimal thickening +----------+--------+--------+--------+------------------+------------------+ CCA Distal106     36                                intimal thickening +----------+--------+--------+--------+------------------+------------------+ ICA Prox  82      27              heterogenous                         +----------+--------+--------+--------+------------------+------------------+ ICA Distal95      34                                                   +----------+--------+--------+--------+------------------+------------------+ ECA       157     31                                                   +----------+--------+--------+--------+------------------+------------------+ +----------+--------+-------+--------+-------------------+           PSV cm/sEDV cmsDescribeArm Pressure (mmHG)  +----------+--------+-------+--------+-------------------+ [id number]                                        +----------+--------+-------+--------+-------------------+ +---------+--------+--+--------+--+ VertebralPSV cm/s27EDV cm/s16 +---------+--------+--+--------+--+  Left Carotid Findings: +----------+--------+--------+--------+------------------+------------------+           PSV cm/sEDV cm/sStenosisPlaque DescriptionComments           +----------+--------+--------+--------+------------------+------------------+ CCA Prox  104     34                                intimal thickening +----------+--------+--------+--------+------------------+------------------+ CCA Distal116     37                                intimal thickening +----------+--------+--------+--------+------------------+------------------+ ICA Prox  105     38              heterogenous                         +----------+--------+--------+--------+------------------+------------------+  ICA Distal105     29                                                   +----------+--------+--------+--------+------------------+------------------+ ECA       121     21                                                   +----------+--------+--------+--------+------------------+------------------+ +----------+--------+--------+--------+-------------------+           PSV cm/sEDV cm/sDescribeArm Pressure (mmHG) +----------+--------+--------+--------+-------------------+ [id number]                                          +----------+--------+--------+--------+-------------------+ +---------+--------+--+--------+--+ VertebralPSV cm/s52EDV cm/s19 +---------+--------+--+--------+--+   Summary: Right Carotid: The extracranial vessels were near-normal with only minimal wall                thickening or plaque. Left Carotid: The extracranial vessels were near-normal with only minimal wall                thickening or plaque. Vertebrals:  Bilateral vertebral arteries demonstrate antegrade flow. Subclavians: Normal flow hemodynamics were seen in bilateral subclavian              arteries. *See table(s) above for measurements and observations.     Preliminary     Labs:  CBC: Recent Labs    09/21/20 1605 09/22/20 0611 09/23/20 0405  WBC 12.0* 12.7* 9.8  HGB 15.7 15.0 14.4  HCT 47.1 45.0 43.2  PLT 288 298 284    COAGS: No results for input(s): INR, APTT in the last 8760 hours.  BMP: Recent Labs    09/21/20 1605 09/22/20 0611 09/23/20 0405  NA 136 135 134*  K 3.7 4.2 4.0  CL 101 99 102  CO2 24 24 22   GLUCOSE 98 91 131*  BUN 15 19 20   CALCIUM 9.5 9.3 9.0  CREATININE 1.76* 1.79* 1.87*  GFRNONAA 45* 44* 41*    LIVER FUNCTION TESTS: Recent Labs    09/21/20 1605  BILITOT 0.9  AST 29  ALT 22  ALKPHOS 105  PROT 7.9  ALBUMIN 3.8    TUMOR MARKERS: No results for input(s): AFPTM, CEA, CA199, CHROMGRNA in the last 8760 hours.  Assessment and Plan: Lung mass with concern for widespread metastatic disease IR consulted for lymph node biopsy at the request of Dr. Lamonte Sakai. CT imaging reviewed by Dr. Kathlene Cote who approves patient for right supraclavicular lymph node biopsy.  Patient assessed at bedside.  He is agreeable to proceed.  He is NPO.   Risks and benefits was discussed with the patient and/or patient's family including, but not limited to bleeding, infection, damage to adjacent structures or low yield requiring additional tests.  All of the questions were answered and there is agreement to proceed.  Consent signed and in chart.  Thank you for this interesting consult.  I greatly enjoyed meeting Tyler Barker and look forward to participating in their care.  A copy of this report was sent to the requesting provider on this date.  Electronically Signed: Docia Barrier, PA 09/23/2020, 10:33  AM   I spent a total of 40 Minutes    in face to face in  clinical consultation, greater than 50% of which was counseling/coordinating care for lymphadenopathy.

## 2020-09-23 NOTE — Progress Notes (Signed)
NAME:  Tyler Barker, MRN:  [id number], DOB:  02-12-1961, LOS: 1 ADMISSION DATE:  09/21/2020, CONSULTATION DATE:  09/22/20 REFERRING MD:  Dwyane Dee , CHIEF COMPLAINT:  Metastatic disease; lung mass   Brief History   59 yo M presenting with gait disturbance, found to have metastatic disease involving brain, bone, liver, lung.  PCCM is consulted regarding the lung mass.   History of present illness   59 yo M with tobacco use disorder, back pain with ongoing chiropractic care, presents to ED 09/21/20 for evaluation of back pain, lower extremity paresthesias and difficultly walking. The patient stated that these symptoms began 1 week ago, and have progressively worsened. The patient visited a chiropractor for these symptoms 11/5 and was told he has a pinched nerve. As the symptoms did not improve, the patient presented to the ED.   CT H acquired which revealed loss of grey-white matter differentiation in posterior R parietal lobe, concerning for possible acute infarct, as well as prior small lacunar infarct of L cerebellum. MRI brain was then acquired  Which revealed scattered enhancing lesions, concerning for metastatic disease. MRI spine, CT c/a/p then acquired which reveal RUL lung mass, numerous pulmonary nodules, extensive thoracic and abdominal adenopathy, liver mets, osseous mets within L spine, Sacrum, and skeletal muscle mets within L paraspinous musculature.   PCCM is consulted regarding the lung mass.   Past Medical History  Tobacco use disorder  Significant Hospital Events   11/6 presents to ED. Found to have CT H findings concerning for acute CVA, and MRI brain/spine, CT c/a/p findings concerning for widely metastatic disease 11/7 PCCM consult regarding lung mass   Consults:  PCCM  Neurology   Procedures:    Significant Diagnostic Tests:  11/6 CT H> loss of grey white differentiate in Posterior R parietal lobe, without hemorrhage. Small old lacunar infarct of L cerebellum 11/6 MRI  brain> multiple enhancing lesions throughout brain c/w metastatic disease 11/6 MRI c/t/l spine, SI> scattered osseous metastatic disease throughout lumbar spine and sacrum. Most prominent at L3, with associated pathologic fx. Soft tissue met within L paraspinous muscle at L4 level.  11/6 CT c/a/p>  -extensive thoracic adenopathy: R supraclavicular node 1.6cm. R paratracheal nodal mass 7.3x5.2x6.8 cm encasing and narrowing SCV and L innominate vein.Subcarinal node 2/7cm, R perihilar node 3x3.5x4.2cm, L AP window node 1.3cm. L hilar nodes 1cm. -Emphysema. RUL mass 3.8 x 2.8 x 3.6 cm. Numerous lung nodules  -Scattered bone lesions within bony thorax, as on MRI. Compression fx T7. Osseus mets L spine and sacrum. L paraspinous intra muscular met.  -Diffuse hepatic mets. Signs of peritoneal mets with lesion adjacent to transverse colon within LUQ   Micro Data:  11/7 SARS Cov2>  Antimicrobials:    Interim history/subjective:  On dexamethasone, not clear that he feels any differently. He is having some back discomfort especially when he moves positions in the bed, treated with Dilaudid, Tylenol  A lot of social stressors in addition to the new medical stressors are present.  Ultimately he will want to get his care in [name] which is his primary home.  He has been living principally out of his truck.  I tried to answer all of his questions regarding his probable diagnosis, next steps for definitive diagnosis and then planning therapy.  Objective    Vitals:   09/23/20 0500 09/23/20 0600 09/23/20 0700 09/23/20 0800  BP: 110/62 (!) 111/58 110/69 109/78  Pulse: (!) 58 60 65 68  Resp: '14 20 20 14  ' Temp:  TempSrc:      SpO2: 95% 95% 96% 98%  Weight:      Height:        Intake/Output Summary (Last 24 hours) at 09/23/2020 0855 Last data filed at 09/23/2020 6742 Gross per 24 hour  Intake 434 ml  Output 1400 ml  Net -966 ml   Filed Weights   09/21/20 1545  Weight: 90.7 kg     Examination: General: Chronically ill-appearing man, laying in bed, no distress HENT: Poor dentition, oropharynx moist, no stridor Lungs: Clear bilaterally, normal respiratory pattern Cardiovascular: Regular, 60s, distant, no murmur Abdomen: Benign Extremities: No edema Neuro: Awake, alert, appropriate, interacts.  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  Suspect primary lung cancer based on imaging P Most straightforward approach to confirm stage IV disease (or extensive stage SCLCA) would be either biopsy of his supraclavicular node or hepatic metastasis. If unable to get a tissue diagnosis via needle biopsy then would arrange for bronchoscopy the endobronchial ultrasound Agree with steroids as ordered, appreciate neurology management Appreciate case management input.  He will need a lot of support in order to get back to [name], manage his truck, get therapy initiated if that is going to happen in [name]. Radiation oncology consulted Pain management  Best practice:  Diet: As per IM Pain/Anxiety/Delirium protocol (if indicated): prn dilaudid VAP protocol (if indicated): na DVT prophylaxis: lovenox, likely need to hold in preparation for IR GI prophylaxis: na Glucose control: monitor Mobility: BR Code Status: Full  Family Communication: Patient has not notified his daughter yet Disposition: Med Tele    Baltazar Apo, MD, PhD 09/23/2020, 8:55 AM [facility] [phone] or if no answer [phone]

## 2020-09-23 NOTE — Procedures (Signed)
Pre Procedure Dx: Concern for metastatic lung cancer Post Procedural Dx: Same  Technically successful US guided biopsy of right supraclavicular lymph node  EBL: None No immediate complications.   Ronny Bacon, MD Pager #: [phone]

## 2020-09-24 LAB — COMPREHENSIVE METABOLIC PANEL
ALT: 23 U/L (ref 0–44)
AST: 30 U/L (ref 15–41)
Albumin: 3.2 g/dL — ABNORMAL LOW (ref 3.5–5.0)
Alkaline Phosphatase: 83 U/L (ref 38–126)
Anion gap: 10 (ref 5–15)
BUN: 27 mg/dL — ABNORMAL HIGH (ref 6–20)
CO2: 23 mmol/L (ref 22–32)
Calcium: 9 mg/dL (ref 8.9–10.3)
Chloride: 102 mmol/L (ref 98–111)
Creatinine, Ser: 1.63 mg/dL — ABNORMAL HIGH (ref 0.61–1.24)
GFR, Estimated: 49 mL/min — ABNORMAL LOW (ref >60)
Glucose, Bld: 129 mg/dL — ABNORMAL HIGH (ref 70–99)
Potassium: 4.7 mmol/L (ref 3.5–5.1)
Sodium: 135 mmol/L (ref 135–145)
Total Bilirubin: 0.7 mg/dL (ref 0.3–1.2)
Total Protein: 6.3 g/dL — ABNORMAL LOW (ref 6.5–8.1)

## 2020-09-24 LAB — CBC
HCT: 38.3 % — ABNORMAL LOW (ref 39.0–52.0)
Hemoglobin: 13 g/dL (ref 13.0–17.0)
MCH: 32.9 pg (ref 26.0–34.0)
MCHC: 33.9 g/dL (ref 30.0–36.0)
MCV: 97 fL (ref 80.0–100.0)
Platelets: 271 10*3/uL (ref 150–400)
RBC: 3.95 MIL/uL — ABNORMAL LOW (ref 4.22–5.81)
RDW: 12.7 % (ref 11.5–15.5)
WBC: 15.9 10*3/uL — ABNORMAL HIGH (ref 4.0–10.5)
nRBC: 0 % (ref 0.0–0.2)

## 2020-09-24 LAB — PHOSPHORUS: Phosphorus: 4 mg/dL (ref 2.5–4.6)

## 2020-09-24 LAB — MAGNESIUM: Magnesium: 2.3 mg/dL (ref 1.7–2.4)

## 2020-09-24 NOTE — Progress Notes (Signed)
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 Clinically this would be most consistent with a lung carcinoma as the primary. Once the additional information is available we will share this with him. Over the course of about an hour long conversation we were unable to make concrete plans for treatment, but Dr. Lisbeth Renshaw would offer radiotherapy to the chest, T7, and Sacral spine. He has brain disease as well that would be treated with radiotherapy. No surgical decompression of his spine or craniotomy is recommended. IR has been consulted with about kyphoplasty, however we think it would be best to proceed with radiotherapy first given the concerns of compromise to his thoracic spinal cord. He is not ready to proceed with making decisions about his treatment with all of the other social issues that need to be sorted out. I tried to impress upon him the severity of his disease in the spine being the primary area of urgent concern since without radiotherapy he would at some point in the coming weeks his lower extremities would likely become permanently paralyzed. He fortunately has documented 4/5 tone of his lower extremities when examined by Dr. Annette Stable, but still has poor sensory function. His case was discussed in palliative care conference and Dr. Delanna Ahmadi team will see him. Unfortunately he had to end our call to answer a call from his daughter. We will discuss this further tomorrow when I reach back out to him. If he is in agreement for radiation, we can simulate his treatment either tomorrow or Thursday to the chest, spine, and sacrum +/- brain since we really haven't had a chance to make decisions on what he would consider doing for the brain as well. He is also aware that without consent for treatment, recommendations would include referral to hospice  care. He will consider some of our conversation and we will try to connect early tomorrow.    Carola Rhine, PAC

## 2020-09-24 NOTE — Progress Notes (Signed)
Overall stable.  His lower extremity strength is maybe a little bit better.  He is now 4 - in his right lower extremity and 4/5 in his left lower extremity.  Both lower extremities have increased tone and diminished sensation.  His back pain is stable.  Patient with widely metastatic disease throughout multiple organs and his spine.  Patient with a pathologic fracture at T7 with some early cord compression.  I recommend that he undergo kyphoplasty per interventional radiology for stabilization of this lesion.  Awaiting pathology with regard to optimal tumor treatment but likely require brain and spinal radiation.  No indications for urgent operative decompression and/or fusion at this point.

## 2020-09-24 NOTE — Plan of Care (Signed)
  Problem: Education: Goal: Knowledge of General Education information will improve Description: Including pain rating scale, medication(s)/side effects and non-pharmacologic comfort measures Outcome: Progressing   Problem: Health Behavior/Discharge Planning: Goal: Ability to manage health-related needs will improve Outcome: Progressing   Problem: Clinical Measurements: Goal: Ability to maintain clinical measurements within normal limits will improve Outcome: Progressing Goal: Will remain free from infection Outcome: Progressing Goal: Diagnostic test results will improve Outcome: Progressing Goal: Respiratory complications will improve Outcome: Progressing Goal: Cardiovascular complication will be avoided Outcome: Progressing   Problem: Activity: Goal: Risk for activity intolerance will decrease Outcome: Progressing   Problem: Nutrition: Goal: Adequate nutrition will be maintained Outcome: Progressing   Problem: Coping: Goal: Level of anxiety will decrease Outcome: Progressing   Problem: Elimination: Goal: Will not experience complications related to bowel motility Outcome: Progressing Goal: Will not experience complications related to urinary retention Outcome: Progressing   Problem: Pain Managment: Goal: General experience of comfort will improve Outcome: Progressing   Problem: Safety: Goal: Ability to remain free from injury will improve Outcome: Progressing   Problem: Skin Integrity: Goal: Risk for impaired skin integrity will decrease Outcome: Progressing   Problem: Education: Goal: Knowledge of disease or condition will improve Outcome: Progressing Goal: Knowledge of secondary prevention will improve Outcome: Progressing Goal: Knowledge of patient specific risk factors addressed and post discharge goals established will improve Outcome: Progressing Goal: Individualized Educational Video(s) Outcome: Progressing   Problem: Coping: Goal: Will verbalize  positive feelings about self Outcome: Progressing Goal: Will identify appropriate support needs Outcome: Progressing   Problem: Health Behavior/Discharge Planning: Goal: Ability to manage health-related needs will improve Outcome: Progressing   Problem: Self-Care: Goal: Ability to participate in self-care as condition permits will improve Outcome: Progressing Goal: Verbalization of feelings and concerns over difficulty with self-care will improve Outcome: Progressing Goal: Ability to communicate needs accurately will improve Outcome: Progressing   Problem: Nutrition: Goal: Risk of aspiration will decrease Outcome: Progressing Goal: Dietary intake will improve Outcome: Progressing   Problem: Ischemic Stroke/TIA Tissue Perfusion: Goal: Complications of ischemic stroke/TIA will be minimized Outcome: Progressing   Problem: Education: Goal: Knowledge of the prescribed therapeutic regimen will improve Outcome: Progressing   Problem: Activity: Goal: Ability to implement measures to reduce episodes of fatigue will improve Outcome: Progressing   Problem: Bowel/Gastric: Goal: Will not experience complications related to bowel motility Outcome: Progressing   Problem: Coping: Goal: Ability to identify and develop effective coping behavior will improve Outcome: Progressing   Problem: Nutritional: Goal: Maintenance of adequate nutrition will improve Outcome: Progressing

## 2020-09-24 NOTE — Progress Notes (Signed)
 NAME:  Tyler Barker, MRN:  [id number], DOB:  11/05/1961, LOS: 2 ADMISSION DATE:  09/21/2020, CONSULTATION DATE:  09/22/20 REFERRING MD:  Kumar , CHIEF COMPLAINT:  Metastatic disease; lung mass   Brief History   59 yo M presenting with gait disturbance, found to have metastatic disease involving brain, bone, liver, lung.  PCCM is consulted regarding the lung mass.   History of present illness   59 yo M with tobacco use disorder, back pain with ongoing chiropractic care, presents to ED 09/21/20 for evaluation of back pain, lower extremity paresthesias and difficultly walking. The patient stated that these symptoms began 1 week ago, and have progressively worsened. The patient visited a chiropractor for these symptoms 11/5 and was told he has a pinched nerve. As the symptoms did not improve, the patient presented to the ED.   CT H acquired which revealed loss of grey-white matter differentiation in posterior R parietal lobe, concerning for possible acute infarct, as well as prior small lacunar infarct of L cerebellum. MRI brain was then acquired  Which revealed scattered enhancing lesions, concerning for metastatic disease. MRI spine, CT c/a/p then acquired which reveal RUL lung mass, numerous pulmonary nodules, extensive thoracic and abdominal adenopathy, liver mets, osseous mets within L spine, Sacrum, and skeletal muscle mets within L paraspinous musculature.   PCCM is consulted regarding the lung mass.   Past Medical History  Tobacco use disorder  Significant Hospital Events   11/6 presents to ED. Found to have CT H findings concerning for acute CVA, and MRI brain/spine, CT c/a/p findings concerning for widely metastatic disease 11/7 PCCM consult regarding lung mass   Consults:  PCCM  Neurology   Procedures:  09/23/2020 per interventional radiology ultrasound-guided right supraclavicular lymph node biopsy which is positive for non-small cell carcinoma Significant Diagnostic Tests:  11/6  CT H> loss of grey white differentiate in Posterior R parietal lobe, without hemorrhage. Small old lacunar infarct of L cerebellum 11/6 MRI brain> multiple enhancing lesions throughout brain c/w metastatic disease 11/6 MRI c/t/l spine, SI> scattered osseous metastatic disease throughout lumbar spine and sacrum. Most prominent at L3, with associated pathologic fx. Soft tissue met within L paraspinous muscle at L4 level.  11/6 CT c/a/p>  -extensive thoracic adenopathy: R supraclavicular node 1.6cm. R paratracheal nodal mass 7.3x5.2x6.8 cm encasing and narrowing SCV and L innominate vein.Subcarinal node 2/7cm, R perihilar node 3x3.5x4.2cm, L AP window node 1.3cm. L hilar nodes 1cm. -Emphysema. RUL mass 3.8 x 2.8 x 3.6 cm. Numerous lung nodules  -Scattered bone lesions within bony thorax, as on MRI. Compression fx T7. Osseus mets L spine and sacrum. L paraspinous intra muscular met.  -Diffuse hepatic mets. Signs of peritoneal mets with lesion adjacent to transverse colon within LUQ   Micro Data:  11/7 SARS Cov2>  Antimicrobials:    Interim history/subjective:  Currently extremely anxious angry and acting out.  High-dose steroids may be contributing to this along with metastatic brain cancer.  He is refusing to interact at this time. Objective    Vitals:   09/23/20 1953 09/23/20 2328 09/24/20 0340 09/24/20 0833  BP: 105/64 (!) 156/74 107/68 101/68  Pulse: 78 69 62 (!) 59  Resp: 18 13 19 (!) 21  Temp: 98.1 F (36.7 C) 97.9 F (36.6 C) (!) 97.3 F (36.3 C) 97.8 F (36.6 C)  TempSrc: Oral Oral Oral Oral  SpO2: 97% 97% 95% 95%  Weight:      Height:        Intake/Output Summary (  Last 24 hours) at 09/24/2020 3664 Last data filed at 09/23/2020 2331 Gross per 24 hour  Intake --  Output 925 ml  Net -925 ml   Filed Weights   09/21/20 1545  Weight: 90.7 kg    Examination: General: Awake alert but extremely angry and agitated and uncooperative HEENT: No JVD is appreciated Neuro:  Currently angry acting out but moves all extremities CV: Heart sounds are regular PULM: Diminished throughout GI: soft, bsx4 active  Extremities: warm/dry,  edema  Skin: no rashes or lesions   Resolved Hospital Problem list     Assessment & Plan:  PCCM has been consulted regarding for consideration of bronchoscopy for  R lung mass.  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  Metastatic Disease with mets to brain, liver, lung, bone, soft tissue, thoracic and abdominal adenopathy.  Suspect primary lung cancer based on imaging P Supraclavicular node biopsy reveals metastatic non-small cell cancer. Medical oncology is following  Best practice:  Diet: As per IM Pain/Anxiety/Delirium protocol (if indicated): prn dilaudid VAP protocol (if indicated): na DVT prophylaxis: lovenox, likely need to hold in preparation for IR GI prophylaxis: na Glucose control: monitor Mobility: BR Code Status: Full  Family Communication: 09/24/2020 patient extremely agitated very agitated and not cooperative at this time. Disposition: Med Tele    Baltazar Apo, MD, PhD 09/24/2020, 9:38 AM [facility] [phone] or if no answer [phone]

## 2020-09-24 NOTE — Evaluation (Signed)
Occupational Therapy Evaluation Patient Details Name: Tyler Barker MRN: [id number] DOB: 27-Nov-1960 Today's Date: 09/24/2020    History of Present Illness 59 yo male with workup revealing MRI (+) multiple vertebral metastases a large R parietal lobe metastatic lesion and multiple smaller enhancing brain metastases; a large hilar and mediastinal mass was partially intersected by the imaging planes MRI were a few scattered subcentimeter acute ischemic nonhemorrhagic infarcts involving the left frontal lobe, right periatrial white matter, and right occipital lobe. PMH hx back injury smoker   Clinical Impression   PT admitted with pain and noted to have multiple metastases. Pt currently with functional limitiations due to the deficits listed below (see OT problem list). Pt internally distracted and very angry with admission process. Pt requires total +2 mod (A) sara stedy to complete transfers. Pt offered power back to charge phone to help resolve one concern for the patient during session. OT communicating patients concerns and needs to transitional care to help align communication to the patient.  Pt will benefit from skilled OT to increase their independence and safety with adls and balance to allow discharge SNF. Pt does not verbalize any support system for other d/c venues at this time. Recommendation for palliative consult so that patient feels he has someone that is helping him understand the multiple doctors, multiple test results and advocates for what he wants.      Follow Up Recommendations  SNF;Supervision/Assistance - 24 hour    Equipment Recommendations  Wheelchair (measurements OT);Wheelchair cushion (measurements OT);Hospital bed    Recommendations for Other Services Other (comment);Speech consult (palliative/ pastoral care)     Precautions / Restrictions Precautions Precautions: Fall Restrictions Weight Bearing Restrictions: No      Mobility Bed Mobility Overal bed  mobility: Needs Assistance Bed Mobility: Supine to Sit;Sit to Supine     Supine to sit: Min assist Sit to supine: Mod assist   General bed mobility comments: pt requires mod cues for back positioning and safety. pt does not show carry over of this education during session. pt educated on log rolling. pt requires very direct cues to help sequence task. pt requires (A) with bil LE to bed surface    Transfers Overall transfer level: Needs assistance   Transfers: Sit to/from Stand Sit to Stand: +2 physical assistance;Mod assist         General transfer comment: pt requires (A) to sequence and progress to standing with therapists.     Balance Overall balance assessment: Needs assistance Sitting-balance support: Bilateral upper extremity supported;Feet supported Sitting balance-Leahy Scale: Poor     Standing balance support: Bilateral upper extremity supported;During functional activity Standing balance-Leahy Scale: Poor Standing balance comment: requires UB (A) to static stand                           ADL either performed or assessed with clinical judgement   ADL Overall ADL's : Needs assistance/impaired Eating/Feeding: Modified independent;Bed level Eating/Feeding Details (indicate cue type and reason): drinking from cup with straw Grooming: Oral care;Sitting Grooming Details (indicate cue type and reason): pt positioned in sara stedy with sitting on the flaps to lean foward for oral care. pt relies on leaning on the bar on his elbows to complete task. pt provided the tooth brush with tooth paste to allow backward chaining and pt then startes to sequence the task and washing off the tooth paste. Pt states "thats the first damn time i have been allowed to brush my  teeth in 6 days" The patients admission is only 3 days at this time                 Toilet Transfer: +2 for physical assistance;Moderate assistance Toilet Transfer Details (indicate cue type and  reason): heavy use of the sara stedy bar to help lower toward commode seat Toileting- Clothing Manipulation and Hygiene: +2 for physical assistance;Maximal assistance Toileting - Clothing Manipulation Details (indicate cue type and reason): pt requires (A) to sustain static standing and max (A) to pull up bottoms. pt able to push them down further once sitting on commode       General ADL Comments: Pt very hard to redirect and to engage initially in session but when therapist voiced leaving due to inability to help pt ... pt then states "wait you said wash your face and brush your teeth. I want to do that"      Vision   Vision Assessment?: Vision impaired- to be further tested in functional context Additional Comments: pt turning head with reading handout with slight chin tilt but due to behavior only able to assess through observation     Perception     Praxis      Pertinent Vitals/Pain Pain Assessment: 0-10 Pain Score: 8  Pain Location: back (R side >L side when pointing) Pain Descriptors / Indicators: Grimacing Pain Intervention(s): Monitored during session;Premedicated before session;Repositioned     Hand Dominance Right   Extremity/Trunk Assessment Upper Extremity Assessment Upper Extremity Assessment: Overall WFL for tasks assessed   Lower Extremity Assessment Lower Extremity Assessment: Defer to PT evaluation;RLE deficits/detail;LLE deficits/detail RLE Coordination: decreased gross motor LLE Coordination: decreased gross motor   Cervical / Trunk Assessment Cervical / Trunk Assessment: Other exceptions (hx of chronic back pain)   Communication Communication Communication: No difficulties   Cognition Arousal/Alertness: Awake/alert Behavior During Therapy: Agitated;Restless Overall Cognitive Status: Impaired/Different from baseline Area of Impairment: Orientation;Attention;Memory;Following commands;Safety/judgement;Awareness;Problem solving                  Orientation Level: Disoriented to;Situation;Time (uncertain how many days he has been present) Current Attention Level: Focused Memory: Decreased short-term memory;Decreased recall of precautions Following Commands: Follows one step commands inconsistently;Follows one step commands with increased time Safety/Judgement: Decreased awareness of safety;Decreased awareness of deficits Awareness: Intellectual Problem Solving: Slow processing;Difficulty sequencing General Comments: pt very tangential and very upset on arrival. pt expressed during session that he is very upset that someone contact his daughter without him and that he was not able to answer questions for her yet due to not knowing his full course of deficits from the medical workup. Pt demonstrates a strong mistrust for the staff by stating " you people" and "this place" Pt very guarded in receiving information stating "no one does what they say they are going to do" "no one is listening" Pt noted to talk to himself in the bathroom as if having a conversation but was completely alone with the door closed. Pt states "i have my mind about me and they keep treating me like i am not here"   General Comments       Exercises     Shoulder Instructions      Home Living Family/patient expects to be discharged to:: Unsure                                 Additional Comments: pt reports being from [name] but does  not provide further information when asked directly multiple times during session. pt states he has two daughters but only one daughter has real communication/ contact with him regularly. Pt reports that he does not want to bother her yet until he knows what is happening. 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        OT Problem List: Decreased activity tolerance;Decreased strength;Decreased  range of motion;Impaired balance (sitting and/or standing);Decreased cognition;Decreased coordination;Decreased safety awareness;Decreased knowledge of use of DME or AE;Decreased knowledge of precautions;Pain      OT Treatment/Interventions: Self-care/ADL training;Therapeutic exercise;Neuromuscular education;Energy conservation;DME and/or AE instruction;Manual therapy;Modalities;Therapeutic activities;Cognitive remediation/compensation;Patient/family education;Balance training    OT Goals(Current goals can be found in the care plan section) Acute Rehab OT Goals Patient Stated Goal: to get some answers OT Goal Formulation: With patient Time For Goal Achievement: 10/08/20 Potential to Achieve Goals: Good  OT Frequency: Min 2X/week   Barriers to D/C: Decreased caregiver support  does not provide any definite (A)        Co-evaluation PT/OT/SLP Co-Evaluation/Treatment: Yes Reason for Co-Treatment: Necessary to address cognition/behavior during functional activity   OT goals addressed during session: ADL's and self-care;Proper use of Adaptive equipment and DME      AM-PAC OT "6 Clicks" Daily Activity     Outcome Measure Help from another person eating meals?: A Little Help from another person taking care of personal grooming?: A Little Help from another person toileting, which includes using toliet, bedpan, or urinal?: A Lot Help from another person bathing (including washing, rinsing, drying)?: A Lot Help from another person to put on and taking off regular upper body clothing?: A Little Help from another person to put on and taking off regular lower body clothing?: A Lot 6 Click Score: 15   End of Session Equipment Utilized During Treatment: Gait belt Nurse Communication: Mobility status;Precautions  Activity Tolerance: Patient tolerated treatment well Patient left: with call bell/phone within reach;in bed;with bed alarm set  OT Visit Diagnosis: Unsteadiness on feet  (R26.81);Muscle weakness (generalized) (M62.81);Pain Pain - part of body: Leg                Time: 3151-7616 OT Time Calculation (min): 76 min Charges:  OT General Charges $OT Visit: 1 Visit OT Evaluation $OT Eval Moderate Complexity: 1 Mod OT Treatments $Self Care/Home Management : 23-37 mins   Brynn, OTR/L  Acute Rehabilitation Services Pager: [phone] Office: [phone] .   Jeri Modena 09/24/2020, 11:13 AM

## 2020-09-24 NOTE — Progress Notes (Signed)
°  To room to discuss procedure of T7 KP and osteocool ablation. Pt is adamant he wants to speak to MD again and get full results of biopsy before discussing ANY procedure/plans at this point.  I suggested - we will be back to room to talk to pt later. He is in agreement

## 2020-09-24 NOTE — Progress Notes (Signed)
PROGRESS NOTE    Tyler Barker  KGU:[id number] DOB: 11/14/1961 DOA: 09/21/2020 PCP: Patient, No Pcp Per    Brief Narrative: This 59 years old male with medical history significant for back injury 10 years ago, chronic smoker presented in the emergency department for the evaluation of back pain associated with lower extremity numbness and difficulty ambulating. Noncontrast head CT raised concern for acute infarction involving posterior right parietal lobe. MRI brain showed multiple scattered enhancing lesions throughout the brain consistent with metastatic disease as well as few scattered subcentimeter acute ischemic infarctions involving the left frontal lobe.  Neurology consulted, Pulmonology consulted for lung biopsy.  Heme-onc consulted, radiation oncology consulted,  recommended iv steroids. Patient is underwent supraclavicular lymph node biopsy, Biopsy confirmed NSCL carcinoma, neurosurgery recommended kyphoplasty for stabilization of the lesion.Marland Kitchen  He is originally from [name], once diagnosis is made and plan decided, needs to be transferred to [name]  Assessment & Plan:   Principal Problem:   Metastatic disease ([place]) Active Problems:   Renal insufficiency   Current smoker   Acute ischemic stroke [facility])   Multiple pulmonary nodules   Liver lesion  1. 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  - Right perihilar lung mass on MRI raises possibility of primary lung malignancy.   - CT chest/abd/pelvis showed Diffuse Liver mets, peritoneal mets, Osseus metas.  - Hematology and radiation oncology consulted. - Started Decadron 4 mg every 12 hours as per hematology - IR consulted for supraclavicular lymph node biopsy - If unable to get tissue diagnosis via needle biopsy then plan bronchoscopy with Endo bronchial ultrasound. -   Neurosurgery consulted, recommended radiation therapy. -Patient underwent supraclavicular lymph node biopsy, pathology consistent with non-small cell carcinoma. -Neurosurgery recommended kyphoplasty for stabilization of the lesion.  No indication for urgent operative decompression.  2. Acute ischemic CVA  - Presents with several days of worsening back pain, leg numbness, and difficulty ambulating and is found to have widespread metastatic disease and a few scattered subcentimeter acute ischemic infarcts involving both hemispheres  - Neurology consulted and much appreciated, recommended cancer workup.  3. Renal insufficiency, unknown chronicity  - SCr is 1.76 on admission with no prior labs available for comparison  - Renally-dose medications, monitor  - Renal functions trending up   DVT prophylaxis: SCDs Code Status: Full Family Communication:  No family at bed side. Disposition Plan:  Status is: Inpatient  Remains inpatient appropriate because:Inpatient level of care appropriate due to severity of illness   Dispo: The patient is from: Home              Anticipated d/c is to: Home              Anticipated d/c date is: 3 days              Patient currently is not medically stable to d/c.  Consultants:   Neurology  Neurosurgery  Heme-onc  Inpatient oncology  Interventional radiology  Procedures: Supraclavicular lymph node biopsy    Antimicrobials: Anti-infectives (From admission, onward)   None      Subjective: Patient was seen and examined at bedside.  No overnight events. He denies any chest pain but reports having weakness and numbness in the lower extremities. Patient has been frusutrated with too much information.  Objective: Vitals:   09/23/20 2328 09/24/20 0340 09/24/20 6283 09/24/20  1246  BP: (!) 156/74 107/68 101/68 (!) 111/51  Pulse: 69 62 (!) 59 90  Resp: 13 19 (!) 21 17  Temp: 97.9 F (36.6 C) (!) 97.3 F (36.3 C) 97.8 F (36.6 C) 98 F (36.7  C)  TempSrc: Oral Oral Oral Oral  SpO2: 97% 95% 95% 100%  Weight:      Height:        Intake/Output Summary (Last 24 hours) at 09/24/2020 1507 Last data filed at 09/24/2020 1200 Gross per 24 hour  Intake 234 ml  Output 925 ml  Net -691 ml   Filed Weights   09/21/20 1545  Weight: 90.7 kg    Examination:  General exam: Appears calm and comfortable, anxious, upset  Respiratory system: Clear to auscultation. Respiratory effort normal. Cardiovascular system: S1 & S2 heard, RRR. No JVD, murmurs, rubs, gallops or clicks. No pedal edema. Gastrointestinal system: Abdomen is nondistended, soft and nontender. No organomegaly or masses felt. Normal bowel sounds heard. Central nervous system: Alert and oriented. No focal neurological deficits. Extremities:  No edema, no cyanosis, no clubbing. Skin: No rashes, lesions or ulcers Psychiatry: Judgement and insight appear normal. Mood & affect appropriate.     Data Reviewed: I have personally reviewed following labs and imaging studies  CBC: Recent Labs  Lab 09/21/20 1605 09/22/20 0611 09/23/20 0405 09/24/20 0339  WBC 12.0* 12.7* 9.8 15.9*  NEUTROABS 6.5  --   --   --   HGB 15.7 15.0 14.4 13.0  HCT 47.1 45.0 43.2 38.3*  MCV 96.3 97.2 98.9 97.0  PLT 288 298 284 025   Basic Metabolic Panel: Recent Labs  Lab 09/21/20 1605 09/22/20 0611 09/23/20 0405 09/24/20 0339  NA 136 135 134* 135  K 3.7 4.2 4.0 4.7  CL 101 99 102 102  CO2 24 24 22 23   GLUCOSE 98 91 131* 129*  BUN 15 19 20  27*  CREATININE 1.76* 1.79* 1.87* 1.63*  CALCIUM 9.5 9.3 9.0 9.0  MG  --   --   --  2.3  PHOS  --   --   --  4.0   GFR: Estimated Creatinine Clearance: 56.6 mL/min (A) (by C-G formula based on SCr of 1.63 mg/dL (H)). Liver Function Tests: Recent Labs  Lab 09/21/20 1605 09/24/20 0339  AST 29 30  ALT 22 23  ALKPHOS 105 83  BILITOT 0.9 0.7  PROT 7.9 6.3*  ALBUMIN 3.8 3.2*   No results for input(s): LIPASE, AMYLASE in the last 168 hours. No  results for input(s): AMMONIA in the last 168 hours. Coagulation Profile: No results for input(s): INR, PROTIME in the last 168 hours. Cardiac Enzymes: No results for input(s): CKTOTAL, CKMB, CKMBINDEX, TROPONINI in the last 168 hours. BNP (last 3 results) No results for input(s): PROBNP in the last 8760 hours. HbA1C: Recent Labs    09/22/20 0611  HGBA1C 5.3   CBG: No results for input(s): GLUCAP in the last 168 hours. Lipid Profile: Recent Labs    09/22/20 0611  CHOL 169  HDL 27*  LDLCALC 102*  TRIG 201*  CHOLHDL 6.3   Thyroid Function Tests: No results for input(s): TSH, T4TOTAL, FREET4, T3FREE, THYROIDAB in the last 72 hours. Anemia Panel: No results for input(s): VITAMINB12, FOLATE, FERRITIN, TIBC, IRON, RETICCTPCT in the last 72 hours. Sepsis Labs: No results for input(s): PROCALCITON, LATICACIDVEN in the last 168 hours.  Recent Results (from the past 240 hour(s))  Respiratory Panel by RT PCR (Flu A&B, Covid) - Nasopharyngeal Swab  Status: None   Collection Time: 09/22/20  2:38 AM   Specimen: Nasopharyngeal Swab  Result Value Ref Range Status   SARS Coronavirus 2 by RT PCR NEGATIVE NEGATIVE Final    Comment: (NOTE) SARS-CoV-2 target nucleic acids are NOT DETECTED.  The SARS-CoV-2 RNA is generally detectable in upper respiratoy specimens during the acute phase of infection. The lowest concentration of SARS-CoV-2 viral copies this assay can detect is 131 copies/mL. A negative result does not preclude SARS-Cov-2 infection and should not be used as the sole basis for treatment or other patient management decisions. A negative result may occur with  improper specimen collection/handling, submission of specimen other than nasopharyngeal swab, presence of viral mutation(s) within the areas targeted by this assay, and inadequate number of viral copies (<131 copies/mL). A negative result must be combined with clinical observations, patient history, and  epidemiological information. The expected result is Negative.  Fact Sheet for Patients:  PinkCheek.be  Fact Sheet for Healthcare Providers:  GravelBags.it  This test is no t yet approved or cleared by the [place] FDA and  has been authorized for detection and/or diagnosis of SARS-CoV-2 by FDA under an Emergency Use Authorization (EUA). This EUA will remain  in effect (meaning this test can be used) for the duration of the COVID-19 declaration under Section 564(b)(1) of the Act, 21 U.S.C. section 360bbb-3(b)(1), unless the authorization is terminated or revoked sooner.     Influenza A by PCR NEGATIVE NEGATIVE Final   Influenza B by PCR NEGATIVE NEGATIVE Final    Comment: (NOTE) The Xpert Xpress SARS-CoV-2/FLU/RSV assay is intended as an aid in  the diagnosis of influenza from Nasopharyngeal swab specimens and  should not be used as a sole basis for treatment. Nasal washings and  aspirates are unacceptable for Xpert Xpress SARS-CoV-2/FLU/RSV  testing.  Fact Sheet for Patients: PinkCheek.be  Fact Sheet for Healthcare Providers: GravelBags.it  This test is not yet approved or cleared by the [place] FDA and  has been authorized for detection and/or diagnosis of SARS-CoV-2 by  FDA under an Emergency Use Authorization (EUA). This EUA will remain  in effect (meaning this test can be used) for the duration of the  Covid-19 declaration under Section 564(b)(1) of the Act, 21  U.S.C. section 360bbb-3(b)(1), unless the authorization is  terminated or revoked. Performed at [facility], [place] [address] [place], [place] [address]      Radiology Studies: [place] CORE BIOPSY (LYMPH NODES)  Result Date: 09/23/2020 INDICATION: No known primary, now with concern for metastatic lung cancer. Please perform ultrasound-guided biopsy right supraclavicular lymph node  for tissue diagnostic purposes. EXAM: ULTRASOUND-GUIDED RIGHT SUPRACLAVICULAR LYMPH NODE BIOPSY COMPARISON:  CT the chest, abdomen and pelvis-09/22/2020 MEDICATIONS: None ANESTHESIA/SEDATION: Moderate (conscious) sedation was employed during this procedure. A total of Versed 1.5 mg and Fentanyl 50 mcg was administered intravenously. Moderate Sedation Time: 10 minutes. The patient's level of consciousness and vital signs were monitored continuously by radiology nursing throughout the procedure under my direct supervision. COMPLICATIONS: None immediate. TECHNIQUE: Informed written consent was obtained from the patient after a discussion of the risks, benefits and alternatives to treatment. Questions regarding the procedure were encouraged and answered. Initial ultrasound scanning demonstrated an approximately 2.4 x 1.6 cm right supraclavicular lymph node (image 2) correlating with the dominant right supraclavicular lymph node seen on preceding chest CT image 5, series 3. An ultrasound image was saved for documentation purposes. The procedure was planned. A timeout was performed prior to the  initiation of the procedure. The operative was prepped and draped in the usual sterile fashion, and a sterile drape was applied covering the operative field. A timeout was performed prior to the initiation of the procedure. Local anesthesia was provided with 1% lidocaine with epinephrine. Under direct ultrasound guidance, an 18 gauge core needle device was utilized to obtain to obtain 6 core needle biopsies of the right supraclavicular lymph node. The samples were placed in saline and submitted to pathology. The needle was removed and hemostasis was achieved with manual compression. Post procedure scan was negative for significant hematoma. A dressing was placed. The patient tolerated the procedure well without immediate postprocedural complication. IMPRESSION: Technically successful ultrasound guided biopsy of right  supraclavicular lymph node. Electronically Signed   By: Sandi Mariscal M.D.   On: 09/23/2020 15:54    Scheduled Meds: . aspirin  300 mg Rectal Daily   Or  . aspirin  325 mg Oral Daily  . dexamethasone (DECADRON) injection  4 mg Intravenous Q12H  . enoxaparin (LOVENOX) injection  40 mg Subcutaneous Daily   Continuous Infusions:   LOS: 2 days    Time spent: 25 mins    Shawna Clamp, MD [facility]   If 7PM-7AM, please contact night-coverage

## 2020-09-24 NOTE — Evaluation (Signed)
Physical Therapy Evaluation Patient Details Name: Tyler Barker MRN: [id number] DOB: [birth year] Today's Date: 09/24/2020   History of Present Illness  59 yo male with workup revealing MRI (+) multiple vertebral metastases a large R parietal lobe metastatic lesion and multiple smaller enhancing brain metastases; a large hilar and mediastinal mass was partially intersected by the imaging planes MRI were a few scattered subcentimeter acute ischemic nonhemorrhagic infarcts involving the left frontal lobe, right periatrial white matter, and right occipital lobe. PMH hx back injury smoker  Clinical Impression  Pt admitted with chronic back pain and noted to have multiple metastases. Pt internally distracted and very angry with admission process. He also demonstrated a moment where he was speaking as if he were talking to someone yet he was alone in the bathroom. Attempted to console pt and educate pt on log rolling and maintaining hips and shoulders together in alignment with bed mobility to reduce pain, with poor carryover noted. Pt requires modAx2 with use of the sara stedy to complete transfers due to his LE weakness and coordination deficits placing him at risk for falls. Pt does not verbalize any support system for other d/c venues at this time. Recommendation for palliative consult so that patient feels he has someone that is helping him understand the multiple doctors, multiple test results and advocates for what he wants. Will continue to follow acutely and recommend SNF to address his deficits mentioned below to maximize his independence and safety with functional mobility.    Follow Up Recommendations SNF;Supervision/Assistance - 24 hour    Equipment Recommendations  Wheelchair (measurements PT);Wheelchair cushion (measurements PT);Hospital bed    Recommendations for Other Services       Precautions / Restrictions Precautions Precautions: Fall Restrictions Weight Bearing Restrictions: No       Mobility  Bed Mobility Overal bed mobility: Needs Assistance Bed Mobility: Supine to Sit;Sit to Supine     Supine to sit: Min assist Sit to supine: Mod assist   General bed mobility comments: pt requires mod cues for back positioning and safety. pt does not show carry over of this education during session. pt educated on log rolling. pt requires very direct cues to help sequence task. pt requires (A) with bil LE to bed surface    Transfers Overall transfer level: Needs assistance   Transfers: Sit to/from Stand Sit to Stand: +2 physical assistance;Mod assist         General transfer comment: pt requires (A) to sequence and progress to standing with therapists. Pt able to pull self up to stand with use of stedy, but displays balance deficits. Increased difficulty noted coming up to stand from lower surfaces and controlling transfers back to sit from stand.   Ambulation/Gait                Stairs            Wheelchair Mobility    Modified Rankin (Stroke Patients Only) Modified Rankin (Stroke Patients Only) Pre-Morbid Rankin Score: No significant disability Modified Rankin: Severe disability     Balance Overall balance assessment: Needs assistance Sitting-balance support: Bilateral upper extremity supported;Feet supported Sitting balance-Leahy Scale: Poor Sitting balance - Comments: UE support required to maintain static sitting balance EOB.   Standing balance support: Bilateral upper extremity supported;During functional activity Standing balance-Leahy Scale: Poor Standing balance comment: requires UB (A) to static stand alogn with B knee block from stedy. Trunk sway noted.  Pertinent Vitals/Pain Pain Assessment: 0-10 Pain Score: 8  Pain Location: back (R side >L side when pointing) Pain Descriptors / Indicators: Grimacing Pain Intervention(s): Monitored during session;Limited activity within patient's  tolerance;Repositioned;Premedicated before session    Home Living Family/patient expects to be discharged to:: Unsure                 Additional Comments: pt reports being from [name] but does not provide further information when asked directly multiple times during session. pt states he has two daughters but only one daughter has real communication/ contact with him regularly. Pt reports that he does not want to bother her yet until he knows what is happening. 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, newspaper   Dominant Hand: Right    Extremity/Trunk Assessment   Upper Extremity Assessment Upper Extremity Assessment: Defer to OT evaluation    Lower Extremity Assessment Lower Extremity Assessment: RLE deficits/detail;LLE deficits/detail RLE Deficits / Details: MMT scores of grossly 2+ to 3- throughout except 4- in anterior tibialis RLE Sensation: decreased light touch (reports partial numbness throughout) RLE Coordination: decreased gross motor LLE Deficits / Details: MMT scores of grossly 2+ to 3- throughout except 4- in anterior tibialis LLE Sensation: decreased light touch (reports partial numbness throughout) LLE Coordination: decreased gross motor    Cervical / Trunk Assessment Cervical / Trunk Assessment: Other exceptions (hx of chronic back pain)  Communication   Communication: No difficulties  Cognition Arousal/Alertness: Awake/alert Behavior During Therapy: Agitated;Restless Overall Cognitive Status: Impaired/Different from baseline Area of Impairment: Orientation;Attention;Memory;Following commands;Safety/judgement;Awareness;Problem solving                 Orientation Level: Disoriented to;Situation;Time (uncertain how many days he has been present) Current Attention Level: Focused Memory: Decreased short-term memory;Decreased recall  of precautions Following Commands: Follows one step commands inconsistently;Follows one step commands with increased time Safety/Judgement: Decreased awareness of safety;Decreased awareness of deficits Awareness: Intellectual Problem Solving: Slow processing;Difficulty sequencing General Comments: pt very tangential and very upset on arrival. pt expressed during session that he is very upset that someone contact his daughter without him and that he was not able to answer questions for her yet due to not knowing his full course of deficits from the medical workup. Pt demonstrates a strong mistrust for the staff by stating " you people" and "this place" Pt very guarded in receiving information stating "no one does what they say they are going to do" "no one is listening" Pt noted to talk to himself in the bathroom as if having a conversation but was completely alone with the door closed. Pt states "i have my mind about me and they keep treating me like i am not here"      General Comments      Exercises     Assessment/Plan    PT Assessment Patient needs continued PT services  PT Problem List Decreased strength;Decreased activity tolerance;Decreased balance;Decreased mobility;Decreased coordination;Decreased cognition;Decreased knowledge of use of DME;Decreased safety awareness;Impaired sensation;Pain       PT Treatment Interventions DME instruction;Gait training;Functional mobility training;Therapeutic activities;Therapeutic exercise;Balance training;Neuromuscular re-education;Cognitive remediation;Patient/family education;Wheelchair mobility training    PT Goals (Current goals can be found in the Care Plan section)  Acute Rehab PT Goals Patient Stated Goal: to get some answers PT Goal Formulation: With patient Time For Goal Achievement: 10/08/20 Potential  to Achieve Goals: Fair    Frequency Min 3X/week   Barriers to discharge Other (comment) (unkown caregiver support and pt lives in  truck)      Co-evaluation PT/OT/SLP Co-Evaluation/Treatment: Yes Reason for Co-Treatment: Necessary to address cognition/behavior during functional activity;For patient/therapist safety;To address functional/ADL transfers PT goals addressed during session: Mobility/safety with mobility;Balance;Proper use of DME OT goals addressed during session: ADL's and self-care;Proper use of Adaptive equipment and DME       AM-PAC PT "6 Clicks" Mobility  Outcome Measure Help needed turning from your back to your side while in a flat bed without using bedrails?: A Little Help needed moving from lying on your back to sitting on the side of a flat bed without using bedrails?: A Little Help needed moving to and from a bed to a chair (including a wheelchair)?: A Lot Help needed standing up from a chair using your arms (e.g., wheelchair or bedside chair)?: A Lot Help needed to walk in hospital room?: Total Help needed climbing 3-5 steps with a railing? : Total 6 Click Score: 12    End of Session Equipment Utilized During Treatment: Gait belt Activity Tolerance: Treatment limited secondary to agitation;Patient tolerated treatment well Patient left: in bed;with call bell/phone within reach;with bed alarm set   PT Visit Diagnosis: Unsteadiness on feet (R26.81);Muscle weakness (generalized) (M62.81);Difficulty in walking, not elsewhere classified (R26.2);Other symptoms and signs involving the nervous system (R29.898)    Time: 7989-2119 PT Time Calculation (min) (ACUTE ONLY): 76 min   Charges:   PT Evaluation $PT Eval Moderate Complexity: 1 Mod PT Treatments $Therapeutic Activity: 8-22 mins        Moishe Spice, PT, DPT Acute Rehabilitation Services  Pager: [phone] Office: [phone]   Orvan Falconer 09/24/2020, 12:24 PM

## 2020-09-24 NOTE — Progress Notes (Signed)
SLP Cancellation Note  Patient Details Name: Tyler Barker MRN: [id number] DOB: [birth year]   Cancelled treatment:       Reason Eval/Treat Not Completed: Other (comment) Pt upset right now, awaiting pain medication talking with his nurse who suggests we try cognitive evaluation at another time. Will continue to follow as able.    Osie Bond., M.A. [place] [facility] Pager [phone] Office [phone]  09/24/2020, 3:58 PM

## 2020-09-25 ENCOUNTER — Inpatient Hospital Stay (HOSPITAL_COMMUNITY): Admission: AD | Admit: 2020-09-25 | Payer: Self-pay | Admitting: Family Medicine

## 2020-09-25 DIAGNOSIS — Z515 Encounter for palliative care: Secondary | ICD-10-CM

## 2020-09-25 DIAGNOSIS — Z7189 Other specified counseling: Secondary | ICD-10-CM

## 2020-09-25 DIAGNOSIS — G893 Neoplasm related pain (acute) (chronic): Secondary | ICD-10-CM

## 2020-09-25 DIAGNOSIS — S22069A Unspecified fracture of T7-T8 vertebra, initial encounter for closed fracture: Secondary | ICD-10-CM

## 2020-09-25 DIAGNOSIS — C3490 Malignant neoplasm of unspecified part of unspecified bronchus or lung: Secondary | ICD-10-CM

## 2020-09-25 DIAGNOSIS — R29898 Other symptoms and signs involving the musculoskeletal system: Secondary | ICD-10-CM

## 2020-09-25 DIAGNOSIS — C7951 Secondary malignant neoplasm of bone: Secondary | ICD-10-CM

## 2020-09-25 LAB — CBC
HCT: 35.7 % — ABNORMAL LOW (ref 39.0–52.0)
Hemoglobin: 12.1 g/dL — ABNORMAL LOW (ref 13.0–17.0)
MCH: 32.8 pg (ref 26.0–34.0)
MCHC: 33.9 g/dL (ref 30.0–36.0)
MCV: 96.7 fL (ref 80.0–100.0)
Platelets: 270 10*3/uL (ref 150–400)
RBC: 3.69 MIL/uL — ABNORMAL LOW (ref 4.22–5.81)
RDW: 12.7 % (ref 11.5–15.5)
WBC: 15.4 10*3/uL — ABNORMAL HIGH (ref 4.0–10.5)
nRBC: 0 % (ref 0.0–0.2)

## 2020-09-25 LAB — BASIC METABOLIC PANEL
Anion gap: 8 (ref 5–15)
BUN: 31 mg/dL — ABNORMAL HIGH (ref 6–20)
CO2: 24 mmol/L (ref 22–32)
Calcium: 8.6 mg/dL — ABNORMAL LOW (ref 8.9–10.3)
Chloride: 102 mmol/L (ref 98–111)
Creatinine, Ser: 1.64 mg/dL — ABNORMAL HIGH (ref 0.61–1.24)
GFR, Estimated: 48 mL/min — ABNORMAL LOW (ref >60)
Glucose, Bld: 138 mg/dL — ABNORMAL HIGH (ref 70–99)
Potassium: 4.5 mmol/L (ref 3.5–5.1)
Sodium: 134 mmol/L — ABNORMAL LOW (ref 135–145)

## 2020-09-25 LAB — PHOSPHORUS: Phosphorus: 3.6 mg/dL (ref 2.5–4.6)

## 2020-09-25 LAB — MAGNESIUM: Magnesium: 2 mg/dL (ref 1.7–2.4)

## 2020-09-25 LAB — SURGICAL PATHOLOGY

## 2020-09-25 MED ORDER — HYDROMORPHONE HCL 1 MG/ML IJ SOLN
0.5000 mg | INTRAMUSCULAR | Status: DC | PRN
  Administered 2020-09-25 – 2020-10-12 (×51): 1 mg via INTRAVENOUS
  Filled 2020-09-25 (×55): qty 1

## 2020-09-25 MED ORDER — LORAZEPAM 0.5 MG PO TABS
0.5000 mg | ORAL_TABLET | ORAL | Status: DC | PRN
  Administered 2020-09-26 – 2020-10-09 (×26): 0.5 mg via ORAL
  Filled 2020-09-25 (×26): qty 1

## 2020-09-25 MED ORDER — OXYCODONE HCL 5 MG PO TABS
5.0000 mg | ORAL_TABLET | ORAL | Status: DC | PRN
  Administered 2020-09-25 – 2020-10-11 (×34): 5 mg via ORAL
  Filled 2020-09-25 (×34): qty 1

## 2020-09-25 NOTE — TOC Initial Note (Addendum)
Transition of Care [facility]) - Initial/Assessment Note    Patient Details  Name: Tyler Barker MRN: [id number] Date of Birth: November 12, 1961  Transition of Care [facility]) CM/SW Contact:    Pollie Friar, RN Phone Number: 09/25/2020, 2:26 PM  Clinical Narrative:                 CM has seen the patient 3 times today. He is very anxious and frustrated.  CM inquired about VA benefits since he has no other health insurance. He has never been to a [place] clinic or hospital. He has never established care through the [place]. CM called April at [facility] and pt has to go through eligibility to see if qualifies for [place] services. CM inquired about a potential transfer to the [place] and that can not happen either without seeing if eligible. Eligibility at the [place] ext [phone] or [phone] CM sat with the patient and went through the eligibility form and submitted it. Per the website it can take up to 1 week to hear results. CM provided him the number to call to f/u with the application. [phone] At this time pt is not sure where he is going after d/c. He states he is waiting to see what the MD's tell him he needs to do.  Daughter is driving in from [name] today to help with making decisions.  Pt is also trying to figure out about getting his truck fixed at [employer] or not having it repaired. CM asked about assisting with anything regarding his truck and he refused. He does have some items in a motel in [place] his daughter will have to retrieve and from his truck. TOC following.  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:  (truck)                                       Prior Living Arrangements/Services Living arrangements for the past 2 months:  (truck) Lives with:: Self Patient language and need for interpreter reviewed:: Yes Do you feel safe going back to the place where you live?: Yes      Need for Family Participation in Patient Care: Yes (Comment) Care giver support system in place?: Yes (comment)   Criminal Activity/Legal Involvement Pertinent to Current Situation/Hospitalization: No - Comment as needed  Activities of Daily Living Home Assistive Devices/Equipment: None ADL Screening (condition at time of admission) Patient's cognitive ability adequate to safely complete daily activities?: Yes Is the patient deaf or have difficulty hearing?: No Does the patient have difficulty seeing, even when wearing glasses/contacts?: No Does the patient have difficulty concentrating, remembering, or making decisions?: No Patient able to express need for assistance with ADLs?: Yes Does the patient have difficulty dressing or bathing?: No Independently performs ADLs?: No Communication: Independent Dressing (OT): Independent Grooming: Independent Feeding: Independent Bathing: Independent Toileting: Independent In/Out Bed: Needs assistance Is this a change from baseline?: Change from baseline, expected to last <3 days Walks in Home: Needs assistance Is this a change from baseline?: Change from baseline, expected to last <3 days Does the patient have difficulty  walking or climbing stairs?: Yes Weakness of Legs: Both Weakness of Arms/Hands: None  Permission Sought/Granted                  Emotional Assessment Appearance:: Appears stated age Attitude/Demeanor/Rapport: Engaged, Complaining, Hostile Affect (typically observed): Overwhelmed, Frustrated Orientation: : Oriented to Self, Oriented to Place, Oriented to  Time, Oriented to Situation Alcohol / Substance Use: Not Applicable Psych Involvement: No (comment)  Admission diagnosis:  Lung  mass [R91.8] Metastasis to brain [facility]) [C79.31] Metastatic disease ([place]) [C79.9] Leg weakness, bilateral [R29.898] Closed T7 fracture ([place]) [S22.069A] Malignant neoplasm metastatic to bone [facility]) [C79.51] Patient Active Problem List   Diagnosis Date Noted  . Brain metastases ([place]) 09/23/2020  . Metastatic disease ([place]) 09/22/2020  . Renal insufficiency 09/22/2020  . Current smoker 09/22/2020  . Acute ischemic stroke ([place]) 09/22/2020  . Lung mass 09/22/2020  . Liver lesion 09/22/2020   PCP:  Patient, No Pcp Per Pharmacy:  No Pharmacies Listed    Social Determinants of Health (SDOH) Interventions    Readmission Risk Interventions No flowsheet data found.

## 2020-09-25 NOTE — Progress Notes (Signed)
I spoke with the patient and his daughter this evening by phone to review the current workup and diagnosis of Stage IV NSCLC, adenosquamous histology and options of palliative radiotherapy. We discussed the alternative would be hospice based care. The sites offered for treatment include T7, right chest mass with impending SVC syndrome, sacrum, and whole brain. While it would be ideal to consider a more focused therapy with SRS, his social contraints and need for urgent treatment align with whole brain radiotherapy, and the benefits of treating microscopic brain disease outweigh the benefits of focused therapy as he would likely develop further CNS disease because of his overall tumor burden. We discussed the rationale for 10 fractions of radiotherapy with each site with the intention of starting treatment to his T7 disease with the hopes of reducing risks of permanent paralysis and hopefully regaining some sensory and motor function. He is aware that there is a possibility of no neurologic improvement based on the duration of his symptoms and delay to treatment which has been his decision at this time. We would plan simulation tomorrow at [facility] in the morning around 10 am with carelink transfer, and hopefully the ability to stay at [facility] following this appointment, and subsequent [place] and start treatment to T7 tomorrow as well. We would still simulate the whole brain, chest, and sacral sites but start those treatments on Monday. Ultimately the goal is to get back to [name] where his daughter plans to help with his care. I am not sure the ability with social work to coordinate, but the best way to facilitate the desire to get home would be to have hospital to hospital transfer and there is a hospital, [facility]; versus discharge to rehab facility in [place] with initiating options of systemic therapy then move to [name]. He anticipates a family meeting tomorrow at 8 am with  palliative medicine and subsequent transfer to [facility] hospital. Written consent will be obtained and placed in the chart, tomorrow at simulation. Regarding complaints of nausea, I wrote for Ativan 0.5 mg off label use q4 hours prn nausea.      Carola Rhine, PAC

## 2020-09-25 NOTE — Progress Notes (Signed)
IR consulted by Dr. Annette Stable for possible image-guided T7 and L3 osteocool ablation with kyphoplasty/vertebroplasty.  Case/images have been reviewed by Dr. Earleen Newport who approves procedure. IR PA attempted to speak with patient regarding procedure 09/24/2020, however he refused to discuss procedure until results of lymph node biopsy (see note from Monia Sabal, PA-C for further information on this encounter).  Further chart checking today revealed that Shona Simpson, PA-C with radiation oncology spoke with patient regarding diagnosis. At this time, patient not ready to make decisions about his treatment with all of his social issues going on- patient requests that he has no more discussions of plan of care until his daughter arrives today. In addition, radiation oncology recommends proceeding with radiotherapy of spinal cord prior to IR osteocool ablation/kyphoplasty/vetebroplasty given the concerns of compromise to his thoracic spinal cord. No plans for IR procedures at this time- will delete order. Please re-consult IR if procedure desired in future.  Please call IR with questions/concerns.   Bea Graff Oddis Westling, PA-C 09/25/2020, 10:20 AM

## 2020-09-25 NOTE — Consult Note (Signed)
Consultation Note Date: 09/25/2020   Patient Name: Tyler Barker  DOB: 23-Jun-1961  MRN: [id number]  Age / Sex: 59 y.o., male  PCP: Patient, No Pcp Per Referring Physician: Samuella Cota, MD  Reason for Consultation: Establishing goals of care and Psychosocial/spiritual support  HPI/Patient Profile: 59 y.o. male  admitted on 09/21/2020 with past   medical history significant for back injury 10 years ago,admitted thru the  emergency department for the evaluation of back painassociated with lower extremity numbness and difficulty ambulating.  Noncontrast head CT raised concern for acute infarction involving posterior right parietal lobe.MRI brain showed multiple scattered enhancing lesions throughout the brain consistent with metastatic disease as well as few scattered subcentimeter acute ischemic infarctions involving the left frontal lobe.  Neurology consulted, Pulmonology consulted for lung biopsy. Heme-onc consulted, radiation oncology consulted. Patient is underwent supraclavicular lymph node biopsy, Biopsy confirmed NSCL carcinoma, neurosurgery recommended kyphoplasty for stabilization of the lesion.  After conversation with radiation oncology patient has made decision to move forward with recommended radiation therapy.  Patient was transferred to [facility] long hospital last night.  Most likely patient will remain here in the hospital for completion of his radiation as transition of care plan is worked out.  Ultimately the plan is for the patient to return home to [name] to be with his family. But if they will not be helpful as it is Patient faces treatment option decisions, advanced directive decisions and anticipatory care needs.   Clinical Assessment and Goals of Care:  This NP [name] reviewed medical records, received report from team, assessed the patient and then meet at the  patient's bedside along with his daughter Tyler Barker to discuss diagnosis, prognosis, GOC, EOL wishes disposition and options.   Concept of Palliative Care was introduced as specialized medical care for people and their families living with serious illness.  If focuses on providing relief from the symptoms and stress of a serious illness.  The goal is to improve quality of life for both the patient and the family.  Values and goals of care important to patient and family were attempted to be elicited.  Created space and opportunity for patient  and family to explore thoughts and feelings regarding current medical situation.   Initially patient was agitated and verbalizing frank  frustration with overall care and hospitalization in general.  He feels that he was told that "the hospital team would figure things out and tell him his options".   He does not feel like that was done, he feels that often he was misled.  Education and conversation had regarding realistic expectations regarding his complicated not only medical situation but social situation as well.  Reinforced with patient that it is difficult, if not impossible to give him definite/100% answers to his questions regarding the future,  not only treatment options but also for transition of care options.  Transitional care team has worked diligently in helping begin the process and hopefully establishing some kind of insurance possibly through the [place].  And helping  the patient understand his options, however limited in establishing care in his home state of [name].  Patient and his daughter understand the seriousness of his disease process, and that his cancer is not curable.   A  discussion was had today regarding advanced directives.  Concepts specific to code status, artifical feeding and hydration, continued IV antibiotics and rehospitalization was had.  The difference between a aggressive medical intervention path  and a palliative  comfort care path for this patient at this time was had.    Education offered on hospice eligibility and hospice benefit.   MOST form introduced, we will discuss further and complete next week.  Meeting is scheduled for Monday morning at 11 AM with the patient, his daughter and Alinda Sierras Case Physiological scientist.    Questions and concerns addressed.  Patient  encouraged to call with questions or concerns.     PMT will continue to support holistically.   No documented healthcare power of attorney or advanced directive.  Patient encouraged to take the opportunity to complete these documents while here in the hospital with the support of the spiritual care department.  He verbalizes that he would like his daughter Tyler Barker to be his decision maker in the event that he cannot make decisions on his own.     SUMMARY OF RECOMMENDATIONS    Code Status/Advance Care Planning:  Full code  Encouraged patient/family to consider DNR/DNI status understanding evidenced based poor outcomes in similar hospitalized patient, as the cause of arrest is likely associated with advanced chronic illness rather than an easily reversible acute cardio-pulmonary event.   Symptom Management:   Per attending-patient reports adequate symptom control presently with current prescribed medication.  See MAR  Palliative Prophylaxis:   Bowel Regimen, Delirium Protocol, Frequent Pain Assessment and Oral Care  Additional Recommendations (Limitations, Scope, Preferences):  Full Scope Treatment  Psycho-social/Spiritual:    Additional Recommendations: Education on Hospice  Prognosis:   < 6 months  Discharge Planning: To Be Determined      Primary Diagnoses: Present on Admission: . Metastatic disease ([place]) . Current smoker . Acute ischemic stroke ([place]) . Liver lesion   I have reviewed the medical record, interviewed the patient and family, and examined the patient. The following aspects are pertinent.  Past  Medical History:  Diagnosis Date  . Medical history non-contributory    Social History   Socioeconomic History  . Marital status: Widowed    Spouse name: Not on file  . Number of children: Not on file  . Years of education: Not on file  . Highest education level: Not on file  Occupational History  . Occupation: truck Geophysicist/field seismologist  Tobacco Use  . Smoking status: Current Every Day Smoker    Packs/day: 1.00  . Smokeless tobacco: Never Used  Substance and Sexual Activity  . Alcohol use: Not Currently    Comment: quit in 1988 after DUI   . Drug use: Not Currently  . Sexual activity: Not on file  Other Topics Concern  . 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 Lives in [name] where he has two daughters.    Social Determinants of Health   Financial Resource Strain:   . Difficulty of Paying Living Expenses: Not on file  Food Insecurity:   . Worried About Charity fundraiser in the Last Year: Not on file  . Ran Out of Food in the Last Year: Not on file  Transportation Needs:   . Film/video editor (Medical): Not on file  . Lack of Transportation (Non-Medical): Not on file  Physical Activity:   . Days of Exercise per Week: Not on file  . Minutes of Exercise per Session: Not on file  Stress:   . Feeling of Stress : Not on file  Social Connections:   . Frequency of Communication with Friends and Family: Not on file  . Frequency of Social Gatherings with Friends and Family: Not on file  . Attends Religious Services: Not on file  . Active Member of Clubs or Organizations: Not on file  . Attends Archivist Meetings: Not on file  . Marital Status: Not on file   Family History  Problem Relation Age of Onset  . Diabetes Maternal Aunt   . Alcoholism Paternal Grandmother    Scheduled Meds: . aspirin  300 mg Rectal Daily   Or  . aspirin  325 mg Oral Daily  . dexamethasone (DECADRON) injection  4 mg Intravenous Q12H    . enoxaparin (LOVENOX) injection  40 mg Subcutaneous Daily   Continuous Infusions: PRN Meds:.acetaminophen **OR** acetaminophen (TYLENOL) oral liquid 160 mg/5 mL **OR** acetaminophen, HYDROmorphone (DILAUDID) injection, ondansetron (ZOFRAN) IV, senna-docusate Medications Prior to Admission:  Prior to Admission medications   Medication Sig Start Date End Date Taking? Authorizing Provider  naproxen sodium (ALEVE) 220 MG tablet Take 220 mg by mouth 2 (two) times daily as needed (pain).   Yes [provider]   No Known Allergies Review of Systems  Constitutional: Positive for fatigue.  Neurological: Positive for weakness.    Physical Exam  Vital Signs: BP 136/75 (BP Location: Left Arm)   Pulse 85   Temp 97.7 F (36.5 C) (Oral)   Resp 20   Ht 5\' 10"  (1.778 m)   Wt 90.7 kg   SpO2 100%   BMI 28.70 kg/m  Pain Scale: 0-10 POSS *See Group Information*: 1-Acceptable,Awake and alert Pain Score: 5    SpO2: SpO2: 100 % O2 Device:SpO2: 100 % O2 Flow Rate: .O2 Flow Rate (L/min): 2 L/min  IO: Intake/output summary:   Intake/Output Summary (Last 24 hours) at 09/25/2020 1500 Last data filed at 09/25/2020 1130 Gross per 24 hour  Intake 480 ml  Output --  Net 480 ml    LBM: Last BM Date: 09/23/20 Baseline Weight: Weight: 90.7 kg Most recent weight: Weight: 90.7 kg     Palliative Assessment/Data:   Discussed with Dr Erlinda Hong and TOC team/[place]  Time In: 0800 Time Out: 1000 Time Total: 120 minutes Greater than 50%  of this time was spent counseling and coordinating care related to the above assessment and plan.  Signed by: [name], NP   Please contact Palliative Medicine Team phone at [phone] for questions and concerns.  For individual provider: See Shea Evans

## 2020-09-25 NOTE — Progress Notes (Signed)
SLP Cancellation Note  Patient Details Name: Tyler Barker MRN: [id number] DOB: 09/01/1961   Cancelled treatment:       Reason Eval/Treat Not Completed: Patient at procedure or test/unavailable (Pt currently with his daughter who has staff on the phone discussing possible plan for transfer to [facility] for radiation treatment. Pt expressed frustration with multiple providers speaking with him about various things. Pt was educated regarding the reason for SLP's consult and it was agreed that the evaluation would be deferred to allow him to engage with other provider via phone about possible treatment plans. Of note, pt's daughter reported that the pt's cognitive-linguistic skills "seem like his normal". SLP will follow up on subsequent date.)  Tyler Barker I. Hardin Negus, [place], [place] Office number [phone] Pager [place] 09/25/2020, 5:05 PM

## 2020-09-25 NOTE — Plan of Care (Signed)
Patient was discussed in multidisciplinary palliative-oncology conference. He has widespread metastatic lung cancer involving a large mediastinal mass and metastasis to bone, liver, peritoneum and lymph nodes. Radiation oncology has been following closely for needs and has outlined recommendations to try to preserve his function and help with his pain as well as some preliminary discussion around goals of care. Mr. Tyler Barker is currently receiving a large amount of serious and difficult medical and prognostic information about his condition. He has a very poor prognosis. 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 His daughter is coming to [place] today. Inpatient palliative consultation is available if primary team feels this is appropriate and depending on patient's acute and post-acute care plan.  Lane Hacker, DO Palliative Medicine

## 2020-09-25 NOTE — Progress Notes (Signed)
I spoke with the patient this morning. His daughter is driving to [facility] and plans to be here sometime midday. He requests that we don't have any more discussions about plan of care until she arrives. I will try to touch base again later today.     Carola Rhine, PAC

## 2020-09-25 NOTE — Progress Notes (Addendum)
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  Imaging in the emergency department revealed widely metastatic disease.  Underwent lymph node biopsy which revealed metastatic non-small cell lung cancer.  Seen by oncology, radiation oncology, palliative medicine, interventional radiology and neurosurgery.  Current recommendation is radiation therapy followed by kyphoplasty.  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  A & P  Metastatic non-small cell lung cancer involving right upper lobe of the lung, liver, peritoneum, bone, soft tissue, brain. --new diagnosis this admission. Followed by neurosurgery, radiation oncology, oncology --Current recommendation is first radiation therapy followed by kyphoplasty T7  Pathologic fracture at T7 --Radiation therapy first --Kyphoplasty after radiation  Severe back pain and leg weakness secondary to metastatic disease. --Neurosurgery recommended kyphoplasty. --Continue pain control.  Renal insufficiency --Suspect CKD stage IIIa. CT abdomen pelvis no hydronephrosis. Bladder was unremarkable.  Abnormal MRI brain.  Seen by neurology with recommendation for treatment of metastatic disease.  Complex care.  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  Truck currently undergoing repairs here in [place].  2 daughters, one driving up.  Disposition unclear at this point given the patient's advanced disease and lack of safe disposition. --PT/OT rec SNF  Disposition Plan:  Discussion: as above. PMT involved.  Discussed with patient today with recommendation to proceed with radiation  therapy.  He is under enormous stress and is perplexed by options presented.  ADDENDUM Discussed with Radiation Oncology A. Dara Lords, request for transfer to [facility] for XRT simulation and beginning of treatment. Request made this evening and transport set up to get to [facility] by 10 AM if no bed available. Discussed w/ pt and daughter who understand and agree. I updated [name] w/ PMT as well.  Status is: Inpatient  Remains inpatient appropriate because:Inpatient level of care appropriate due to severity of illness   Dispo: The patient is from: Home              Anticipated d/c is to: TBD              Anticipated d/c date is: > 3 days              Patient currently is not medically stable to d/c.  DVT prophylaxis: enoxaparin (LOVENOX) injection 40 mg Start: 09/22/20 1000   Code Status: Full Code Family Communication: none present  Murray Hodgkins, MD  [facility] Direct contact: see [email or web address] (further directions at bottom of note if needed) 7PM-7AM contact night coverage as at bottom of note 09/25/2020, 5:44 PM  LOS: 3 days   Significant Hospital Events   .    Consults:  . Neurology . Pulmonology . Oncology . Radiation oncology . Interventional radiology . Neurosurgery   Procedures:  . 11/8 ultrasound-guided biopsy right supraclavicular lymph node  Significant Diagnostic Tests:  Marland Kitchen    Micro Data:  .    Antimicrobials:  .   Interval History/Subjective  CC: f/u back pain  Back pain continues, unable to sit up w/o pain. Very frustrated and stressed.  Objective   Vitals:  Vitals:   09/25/20 1226 09/25/20 1707  BP: 136/75 (!) 142/77  Pulse: 85 70  Resp: 20   Temp: 97.7  F (36.5 C) 97.7 F (36.5 C)  SpO2: 100% 100%    Exam:  Physical Exam Constitutional:      General: He is not in acute distress.    Appearance: He is not toxic-appearing.  Cardiovascular:     Rate and Rhythm: Normal rate and regular rhythm.     Heart sounds: No murmur heard.  No  friction rub. No gallop.   Pulmonary:     Effort: Pulmonary effort is normal. No respiratory distress.     Breath sounds: Normal breath sounds. No wheezing, rhonchi or rales.  Neurological:     Mental Status: He is alert.  Psychiatric:        Mood and Affect: Mood normal.        Behavior: Behavior normal.      I have personally reviewed the following:   Today's Data  . Creatinine stable . WBC 15.4, stable . Hgb slightly lower at 12.1  Scheduled Meds: . aspirin  300 mg Rectal Daily   Or  . aspirin  325 mg Oral Daily  . dexamethasone (DECADRON) injection  4 mg Intravenous Q12H  . enoxaparin (LOVENOX) injection  40 mg Subcutaneous Daily   Continuous Infusions:  Principal Problem:   Metastatic disease ([place]) Active Problems:   Renal insufficiency   Current smoker   Acute ischemic stroke (HCC)   Lung mass   Liver lesion   LOS: 3 days   How to contact the [facility] Attending or Consulting provider [place] or covering provider during after hours [place], for this patient?  1. Check the care team in [facility] and look for a) attending/consulting [facility] provider listed and b) the [facility] team listed 2. Log into [email or web address] and use [place]'s universal password to access. If you do not have the password, please contact the hospital operator. 3. Locate the [facility] provider you are looking for under [facility] and page to a number that you can be directly reached. 4. If you still have difficulty reaching the provider, please page the [facility] (Director on Call) for the Hospitalists listed on amion for assistance.

## 2020-09-25 NOTE — Plan of Care (Signed)
  Problem: Education: Goal: Knowledge of General Education information will improve Description: Including pain rating scale, medication(s)/side effects and non-pharmacologic comfort measures Outcome: Progressing   Problem: Health Behavior/Discharge Planning: Goal: Ability to manage health-related needs will improve Outcome: Progressing   Problem: Clinical Measurements: Goal: Ability to maintain clinical measurements within normal limits will improve Outcome: Progressing Goal: Will remain free from infection Outcome: Progressing Goal: Diagnostic test results will improve Outcome: Progressing Goal: Respiratory complications will improve Outcome: Progressing Goal: Cardiovascular complication will be avoided Outcome: Progressing   Problem: Activity: Goal: Risk for activity intolerance will decrease Outcome: Progressing   Problem: Nutrition: Goal: Adequate nutrition will be maintained Outcome: Progressing   Problem: Coping: Goal: Level of anxiety will decrease Outcome: Progressing   Problem: Elimination: Goal: Will not experience complications related to bowel motility Outcome: Progressing Goal: Will not experience complications related to urinary retention Outcome: Progressing   Problem: Pain Managment: Goal: General experience of comfort will improve Outcome: Progressing   Problem: Safety: Goal: Ability to remain free from injury will improve Outcome: Progressing   Problem: Skin Integrity: Goal: Risk for impaired skin integrity will decrease Outcome: Progressing   Problem: Education: Goal: Knowledge of disease or condition will improve Outcome: Progressing Goal: Knowledge of secondary prevention will improve Outcome: Progressing Goal: Knowledge of patient specific risk factors addressed and post discharge goals established will improve Outcome: Progressing Goal: Individualized Educational Video(s) Outcome: Progressing   Problem: Coping: Goal: Will verbalize  positive feelings about self Outcome: Progressing Goal: Will identify appropriate support needs Outcome: Progressing   Problem: Health Behavior/Discharge Planning: Goal: Ability to manage health-related needs will improve Outcome: Progressing   Problem: Self-Care: Goal: Ability to participate in self-care as condition permits will improve Outcome: Progressing Goal: Verbalization of feelings and concerns over difficulty with self-care will improve Outcome: Progressing Goal: Ability to communicate needs accurately will improve Outcome: Progressing   Problem: Nutrition: Goal: Risk of aspiration will decrease Outcome: Progressing Goal: Dietary intake will improve Outcome: Progressing   Problem: Ischemic Stroke/TIA Tissue Perfusion: Goal: Complications of ischemic stroke/TIA will be minimized Outcome: Progressing   Problem: Education: Goal: Knowledge of the prescribed therapeutic regimen will improve Outcome: Progressing   Problem: Activity: Goal: Ability to implement measures to reduce episodes of fatigue will improve Outcome: Progressing   Problem: Bowel/Gastric: Goal: Will not experience complications related to bowel motility Outcome: Progressing   Problem: Coping: Goal: Ability to identify and develop effective coping behavior will improve Outcome: Progressing   Problem: Nutritional: Goal: Maintenance of adequate nutrition will improve Outcome: Progressing

## 2020-09-25 NOTE — Progress Notes (Signed)
No new issues or problems.  Kyphoplasty has been delayed per radiation oncology's request to perform radiation on his spinal metastasis first.  Motor examination of his lower extremities remain improved from preop, 4/5 in the right 4+/5 on the left.  Patient remains kind of agitated and argumentative.  Some of this may be secondary to the steroids.  I have no new recommendations at this point.  Patient should proceed with radiation of his T7 lesion as soon as possible.  Continue steroids for now.

## 2020-09-25 NOTE — Hospital Course (Addendum)
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  Imaging in the emergency department revealed widely metastatic disease.  Underwent lymph node biopsy which revealed metastatic non-small cell lung cancer.  Seen by oncology, radiation oncology, palliative medicine, interventional radiology and neurosurgery.  Current recommendation is radiation therapy followed by kyphoplasty.  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

## 2020-09-25 NOTE — Progress Notes (Signed)
[facility] contacted to transport patient to [facility] unit Thursday, 09/26/20 by 1000 via ambulance, spoke with [facility]. Patient and pt's daughter aware of pending transport. Will pass along to evening shift RN.

## 2020-09-25 NOTE — Progress Notes (Signed)
Physical Therapy Treatment Patient Details Name: Tyler Barker MRN: [id number] DOB: [birth year] Today's Date: 09/25/2020    History of Present Illness 59 yo male with workup revealing MRI (+) multiple vertebral metastases a large R parietal lobe metastatic lesion and multiple smaller enhancing brain metastases; a large hilar and mediastinal mass was partially intersected by the imaging planes MRI were a few scattered subcentimeter acute ischemic nonhemorrhagic infarcts involving the left frontal lobe, right periatrial white matter, and right occipital lobe. PMH hx back injury smoker    PT Comments    Pt continues to be very tangential and upset this date, expressing distrust in the medical staff. Attempted to increase pt control over situation through asking pt to express his goals for the session. He reports overall wanting to get back to walking and back to normal but reports that he cannot stand or get in a vertical position due to his LE weakness and back pain, and thus he refused to attempt to sit up or get OOB this date. Therefore, focused session on educating pt on benefits of PT and continuing to utilize LEs to maximize strength and prevent further functional decline and weakening of the legs. Pt agreeable to performing AAROM exercises supine in bed, displaying increased weakness on his R compared to his L. Also, ankle dorsiflexion PROM appears limited bilaterally and a passive stretch was applied to both to prevent contractures. Will continue to follow acutely and recommend SNF upon d/c.  Follow Up Recommendations  SNF;Supervision/Assistance - 24 hour     Equipment Recommendations  Wheelchair (measurements PT);Wheelchair cushion (measurements PT);Hospital bed;Other (comment) (mechnical lift)    Recommendations for Other Services       Precautions / Restrictions Precautions Precautions: Fall Restrictions Weight Bearing Restrictions: No    Mobility  Bed Mobility                General bed mobility comments: Pt stating he cannot sit up due to back pain.  Transfers                 General transfer comment: Pt refusing to attempt to get up out of bed this date.  Ambulation/Gait                 Stairs             Wheelchair Mobility    Modified Rankin (Stroke Patients Only) Modified Rankin (Stroke Patients Only) Pre-Morbid Rankin Score: No significant disability Modified Rankin: Severe disability     Balance                                            Cognition Arousal/Alertness: Awake/alert Behavior During Therapy: Agitated;Restless Overall Cognitive Status: Impaired/Different from baseline Area of Impairment: Orientation;Attention;Memory;Following commands;Safety/judgement;Awareness;Problem solving                 Orientation Level: Disoriented to;Situation;Time (uncertain how many days he has been present) Current Attention Level: Focused Memory: Decreased short-term memory;Decreased recall of precautions Following Commands: Follows one step commands inconsistently;Follows one step commands with increased time Safety/Judgement: Decreased awareness of safety;Decreased awareness of deficits Awareness: Intellectual Problem Solving: Slow processing;Difficulty sequencing General Comments: pt very tangential and very upset on arrival. Pt demonstrates a strong mistrust for the staff by stating " you people" and "this place" Pt very guarded in receiving information stating "no one does what they say they are  going to do". Pt upset about repeating himself with everyone and that he has to keep saying he cannot sit up due to his back pain.      Exercises General Exercises - Lower Extremity Ankle Circles/Pumps: AAROM;Both;10 reps;Supine Heel Slides: AAROM;Both;10 reps;Supine Hip ABduction/ADduction: AAROM;Both;10 reps;Supine Straight Leg Raises: AAROM;Both;10 reps;Supine Other Exercises Other Exercises:  Passive stretch to B ankles into dorsiflexion 1x ~60 sec each    General Comments        Pertinent Vitals/Pain Pain Assessment: Faces Faces Pain Scale: Hurts little more Pain Location: back Pain Descriptors / Indicators: Grimacing (reports of intense pain with movement) Pain Intervention(s): Limited activity within patient's tolerance;Monitored during session;Repositioned    Home Living                      Prior Function            PT Goals (current goals can now be found in the care plan section) Acute Rehab PT Goals Patient Stated Goal: to get some answers and to walk and return to normal PT Goal Formulation: With patient/family Time For Goal Achievement: 10/08/20 Potential to Achieve Goals: Fair Progress towards PT goals: Progressing toward goals    Frequency    Min 3X/week      PT Plan Current plan remains appropriate    Co-evaluation              AM-PAC PT "6 Clicks" Mobility   Outcome Measure  Help needed turning from your back to your side while in a flat bed without using bedrails?: A Little Help needed moving from lying on your back to sitting on the side of a flat bed without using bedrails?: A Little Help needed moving to and from a bed to a chair (including a wheelchair)?: A Lot Help needed standing up from a chair using your arms (e.g., wheelchair or bedside chair)?: A Lot Help needed to walk in hospital room?: Total Help needed climbing 3-5 steps with a railing? : Total 6 Click Score: 12    End of Session   Activity Tolerance: Treatment limited secondary to agitation;Patient tolerated treatment well Patient left: in bed;with call bell/phone within reach;with bed alarm set;with family/visitor present (daughter present)   PT Visit Diagnosis: Unsteadiness on feet (R26.81);Muscle weakness (generalized) (M62.81);Difficulty in walking, not elsewhere classified (R26.2);Other symptoms and signs involving the nervous system (R29.898)      Time: 9449-6759 PT Time Calculation (min) (ACUTE ONLY): 32 min  Charges:  $Therapeutic Exercise: 23-37 mins                     Moishe Spice, PT, DPT Acute Rehabilitation Services  Pager: [phone] Office: [phone]    Orvan Falconer 09/25/2020, 3:53 PM

## 2020-09-26 ENCOUNTER — Ambulatory Visit
Admit: 2020-09-26 | Discharge: 2020-09-26 | Disposition: A | Discharge status: Home / Self Care | Payer: Self-pay | Attending: Radiation Oncology | Admitting: Radiation Oncology

## 2020-09-26 DIAGNOSIS — C7931 Secondary malignant neoplasm of brain: Secondary | ICD-10-CM

## 2020-09-26 DIAGNOSIS — Z7189 Other specified counseling: Secondary | ICD-10-CM

## 2020-09-26 DIAGNOSIS — Z515 Encounter for palliative care: Secondary | ICD-10-CM

## 2020-09-26 DIAGNOSIS — I639 Cerebral infarction, unspecified: Secondary | ICD-10-CM

## 2020-09-26 DIAGNOSIS — G893 Neoplasm related pain (acute) (chronic): Secondary | ICD-10-CM

## 2020-09-26 MED ORDER — LORAZEPAM 1 MG PO TABS
0.5000 mg | ORAL_TABLET | Freq: Once | ORAL | Status: AC
  Administered 2020-09-26: 0.5 mg via ORAL
  Filled 2020-09-26: qty 0.5

## 2020-09-26 MED ORDER — OXYCODONE HCL ER 10 MG PO T12A
10.0000 mg | EXTENDED_RELEASE_TABLET | Freq: Two times a day (BID) | ORAL | Status: DC
  Administered 2020-09-26 – 2020-09-27 (×2): 10 mg via ORAL
  Filled 2020-09-26 (×2): qty 1

## 2020-09-26 MED ORDER — SENNOSIDES-DOCUSATE SODIUM 8.6-50 MG PO TABS
1.0000 | ORAL_TABLET | Freq: Two times a day (BID) | ORAL | Status: DC
  Administered 2020-09-26 – 2020-10-11 (×24): 1 via ORAL
  Filled 2020-09-26 (×25): qty 1

## 2020-09-26 MED ORDER — GUAIFENESIN ER 600 MG PO TB12
600.0000 mg | ORAL_TABLET | Freq: Two times a day (BID) | ORAL | Status: DC
  Administered 2020-09-26 – 2020-10-11 (×29): 600 mg via ORAL
  Filled 2020-09-26 (×30): qty 1

## 2020-09-26 NOTE — Progress Notes (Addendum)
PROGRESS NOTE    Mehmet Scally  BTD:[id number] DOB: 1961-06-25 DOA: 09/21/2020 PCP: Patient, No Pcp Per    Chief Complaint  Patient presents with  . 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  Imaging in the emergency department revealed widely metastatic disease.  Underwent lymph node biopsy which revealed metastatic non-small cell lung cancer.  Seen by oncology, radiation oncology, palliative medicine, interventional radiology and neurosurgery.  Current recommendation is radiation therapy followed by kyphoplasty.  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 [facility]) Active Problems:   Metastatic disease ([place])   Renal insufficiency   Current smoker   Acute ischemic stroke ([place])   Lung mass   Liver lesion  New diagnosis of 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 (MRI personally reviewed showed hemorrhagic brain mets with associated edema) and pathologic fracture at T7 with possible cord compression -His transfer from [facility] to [place] for urgent radiation treatment due to patient is at risk of paralysis, plan for radiation therapy followed by kyphoplasty at T7 -he is also getting XRT to right chest mass with impending SVC syndrome, sacrum, and whole brain -Continue steroid -Appreciate oncology, radiation oncology, neurosurgery, palliative care input  Few scattered subcentimeter acute ischemic nonhemorrhagic infarct -MRI imaging personally reviewed -Case discussed with  neurology -Appreciate neurology input , will follow recommendation  Leukocytosis likely steroid induced  CKD 3 a CT abdomen pelvis no obstructive nephropathy, bladder scan unremarkable Patient is at risk of urinary retention, will close monitor  Cancer related pain/nausea -Currently on Zofran as needed, Ativan if nausea not controlled by Zofran -Continue as needed analgesic, add on MS Contin, consider Neurontin, but will adjust medication gradually  Constipation No BM for a week, increased stool regimen    Very difficult situation with new diagnosis of metastatic disease, patient from [name] with no permanent home, 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  Truck currently undergoing repairs here in [place].  2 daughters very supportive Appreciated case management assistant   DVT prophylaxis: enoxaparin (LOVENOX) injection 40 mg Start: 09/22/20 1000   Code Status: Full Family Communication: Daughter Estill Bamberg at bedside Disposition:   Status is: Inpatient  Dispo: The patient is from: Home              Anticipated d/c is to: To be determined              Anticipated d/c date is: To be determined                Consultants:   Medical oncology  Radiation oncology  Neurosurgery  Neurology  Palliative care   Pulmonology  Interventional radiology  Procedures:  ULTRASOUND-GUIDED RIGHT SUPRACLAVICULAR LYMPH NODE BIOPSY on 11/8  Antimicrobials:   None     Objective: Vitals:   09/25/20 2315 09/25/20 2352 09/26/20 0043 09/26/20 0433  BP: 123/71 132/68 (!) 147/88 (!) 154/87  Pulse: 66 74 71 70  Resp: 18  16 20   Temp: 98.1 F (36.7 C) 98.2 F (36.8 C) 98.4 F (36.9 C)  98.1 F (36.7 C)  TempSrc: Oral Oral Oral Oral  SpO2: 99% 98% 94% 96%  Weight:      Height:        Intake/Output Summary (Last 24 hours) at 09/26/2020 1324 Last data filed at 09/26/2020 0650 Gross per 24 hour  Intake 400 ml  Output 100 ml  Net 300 ml   Filed Weights   09/21/20 1545   Weight: 90.7 kg    Examination:  General exam: Frustrated, in pain Respiratory system: Overall diminished breath sound, no wheezing, no rhonchi, no rales. Respiratory effort normal. Cardiovascular system: S1 & S2 heard, RRR. No pedal edema. Gastrointestinal system: Abdomen is nondistended, soft and nontender. Normal bowel sounds heard. Central nervous system: Alert and oriented.  Extremities: Not able to lift bilateral lower extremity against gravity, right foot drop Skin: No rashes, lesions or ulcers Psychiatry: Anxious and frustrated    Data Reviewed: I have personally reviewed following labs and imaging studies  CBC: Recent Labs  Lab 09/21/20 1605 09/22/20 0611 09/23/20 0405 09/24/20 0339 09/25/20 0345  WBC 12.0* 12.7* 9.8 15.9* 15.4*  NEUTROABS 6.5  --   --   --   --   HGB 15.7 15.0 14.4 13.0 12.1*  HCT 47.1 45.0 43.2 38.3* 35.7*  MCV 96.3 97.2 98.9 97.0 96.7  PLT 288 298 284 271 419    Basic Metabolic Panel: Recent Labs  Lab 09/21/20 1605 09/22/20 0611 09/23/20 0405 09/24/20 0339 09/25/20 0345  NA 136 135 134* 135 134*  K 3.7 4.2 4.0 4.7 4.5  CL 101 99 102 102 102  CO2 24 24 22 23 24   GLUCOSE 98 91 131* 129* 138*  BUN 15 19 20  27* 31*  CREATININE 1.76* 1.79* 1.87* 1.63* 1.64*  CALCIUM 9.5 9.3 9.0 9.0 8.6*  MG  --   --   --  2.3 2.0  PHOS  --   --   --  4.0 3.6    GFR: Estimated Creatinine Clearance: 56.3 mL/min (A) (by C-G formula based on SCr of 1.64 mg/dL (H)).  Liver Function Tests: Recent Labs  Lab 09/21/20 1605 09/24/20 0339  AST 29 30  ALT 22 23  ALKPHOS 105 83  BILITOT 0.9 0.7  PROT 7.9 6.3*  ALBUMIN 3.8 3.2*    CBG: No results for input(s): GLUCAP in the last 168 hours.   Recent Results (from the past 240 hour(s))  Respiratory Panel by RT PCR (Flu A&B, Covid) - Nasopharyngeal Swab     Status: None   Collection Time: 09/22/20  2:38 AM   Specimen: Nasopharyngeal Swab  Result Value Ref Range Status   SARS Coronavirus 2 by RT  PCR NEGATIVE NEGATIVE Final    Comment: (NOTE) SARS-CoV-2 target nucleic acids are NOT DETECTED.  The SARS-CoV-2 RNA is generally detectable in upper respiratoy specimens during the acute phase of infection. The lowest concentration of SARS-CoV-2 viral copies this assay can detect is 131 copies/mL. A negative result does not preclude SARS-Cov-2 infection and should not be used as the sole basis for treatment or other patient management decisions. A negative result may occur with  improper specimen collection/handling, submission of specimen other than nasopharyngeal swab, presence of viral mutation(s) within the areas targeted by this assay, and inadequate number of viral copies (<131 copies/mL). A negative result must be combined with clinical observations, patient history, and epidemiological information. The expected result is Negative.  Fact Sheet for Patients:  PinkCheek.be  Fact Sheet for Healthcare Providers:  GravelBags.it  This  test is no t yet approved or cleared by the [place] and  has been authorized for detection and/or diagnosis of SARS-CoV-2 by FDA under an Emergency Use Authorization (EUA). This EUA will remain  in effect (meaning this test can be used) for the duration of the COVID-19 declaration under Section 564(b)(1) of the Act, 21 U.S.C. section 360bbb-3(b)(1), unless the authorization is terminated or revoked sooner.     Influenza A by PCR NEGATIVE NEGATIVE Final   Influenza B by PCR NEGATIVE NEGATIVE Final    Comment: (NOTE) The Xpert Xpress SARS-CoV-2/FLU/RSV assay is intended as an aid in  the diagnosis of influenza from Nasopharyngeal swab specimens and  should not be used as a sole basis for treatment. Nasal washings and  aspirates are unacceptable for Xpert Xpress SARS-CoV-2/FLU/RSV  testing.  Fact Sheet for Patients: PinkCheek.be  Fact Sheet for  Healthcare Providers: GravelBags.it  This test is not yet approved or cleared by the [place] FDA and  has been authorized for detection and/or diagnosis of SARS-CoV-2 by  FDA under an Emergency Use Authorization (EUA). This EUA will remain  in effect (meaning this test can be used) for the duration of the  Covid-19 declaration under Section 564(b)(1) of the Act, 21  U.S.C. section 360bbb-3(b)(1), unless the authorization is  terminated or revoked. Performed at [facility], [place] [address], [place], [place] [address]          Radiology Studies: No results found.      Scheduled Meds: . aspirin  300 mg Rectal Daily   Or  . aspirin  325 mg Oral Daily  . dexamethasone (DECADRON) injection  4 mg Intravenous Q12H  . enoxaparin (LOVENOX) injection  40 mg Subcutaneous Daily  . guaiFENesin  600 mg Oral BID  . oxyCODONE  10 mg Oral Q12H  . senna-docusate  1 tablet Oral BID   Continuous Infusions:   LOS: 4 days   Time spent: 73mins Greater than 50% of this time was spent in counseling, explanation of diagnosis, planning of further management, and coordination of care.  I have personally reviewed and interpreted on  09/26/2020 daily labs, tele strips, imagings as discussed above under date review session and assessment and plans.  I reviewed all nursing notes, pharmacy notes, consultant notes,  vitals, pertinent old records  I have discussed plan of care as described above with RN , patient and family on 09/26/2020  Voice Recognition /Dragon dictation system was used to create this note, attempts have been made to correct errors. Please contact the author with questions and/or clarifications.   Florencia Reasons, MD PhD FACP [facility]  Available via Epic secure chat 7am-7pm for nonurgent issues Please page for urgent issues To page the attending provider between 7A-7P or the covering provider during after hours 7P-7A, please log into  the web site [email or web address] and access using universal [place] password for that web site. If you do not have the password, please call the hospital operator.    09/26/2020, 1:24 PM

## 2020-09-26 NOTE — Progress Notes (Signed)
OT Cancellation Note  Patient Details Name: Tyler Barker MRN: [id number] DOB: 1961-09-19   Cancelled Treatment:    Reason Eval/Treat Not Completed: Other (comment) patient off the floor upon arrival to room, will re-attempt as schedule permits.  Delbert Phenix OT OT pager: [phone]   Rosemary Holms 09/26/2020, 12:50 PM

## 2020-09-26 NOTE — Progress Notes (Addendum)
Received Patient via [place].  Pt. Alert and oriented X4,, Speech clear.  Neuro check performed.  Patient able to move upper extremities with no effort.  C/o pain at epigastric region with any turning or movement.  Pt unable to lift right leg off bed but is able to lift left with maximum effort and only 2 inches off bed.  Stated legs are numb but has sensation when touched.  Patient was seen turning to his left side but stated he has pain with every movement.  He was encouraged to avoid any movement that causes pain at this time.  MD aware of patient arrival to [place]. Pt has been given pain medication and antiemetics to control pain and nausea.

## 2020-09-26 NOTE — Progress Notes (Signed)
PT Cancellation Note  Patient Details Name: Tyler Barker MRN: [id number] DOB: 1961/08/30   Cancelled Treatment:    Reason Eval/Treat Not Completed: Patient at procedure or test/unavailable. Will follow.    Blondell Reveal Kistler PT 09/26/2020  Acute Rehabilitation Services Pager [phone] Office [phone]

## 2020-09-26 NOTE — Progress Notes (Signed)
SLP Cancellation Note  Patient Details Name: Tyler Barker MRN: [id number] DOB: May 25, 1961   Cancelled treatment:       Reason Eval/Treat Not Completed: Patient at procedure or test/unavailable; pt currently off the floor for XRT. Will continue efforts.    [place], MA, CCC-SLP 09/26/2020, 4:02 PM

## 2020-09-26 NOTE — Progress Notes (Signed)
Brief neurology progress note  The patient was found to have 3-4 small punctate ischemic strokes in setting of hemorrhagic metastatic lesions to the brain which are not likely symptomatic.   To complete stroke workup recommend TTE also consider decreasing aspirin to 81mg  daily.  Discussed this with the patient and his daughter and answered all questions.

## 2020-09-26 NOTE — TOC Initial Note (Signed)
Transition of Care [facility]) - Initial/Assessment Note    Patient Details  Name: Tyler Barker MRN: [id number] Date of Birth: October 12, 1961  [facility] of [facility]) CM/SW Contact:    Lynnell Catalan, RN Phone Number: 09/26/2020, 2:16 PM  Clinical Narrative:                 Went to pt room to introduce myself to pt and daughter Estill Bamberg. MD was speaking with pt so Estill Bamberg asked that I step outside and speak with her. Long conversation was had about all potential DC plans. It appears that pt will be in the hospital for the remainder of his radiation as the provider notes indicate. At this time the pt returning to [name] after completing radiation may look like the most appropriate plan as he doesn't have any support in [place].   There are many moving pieces to this pt dc plan and the daughter is aware that because pt has no insurance that some aspects may be private pay. Previous TOC note indicates that [place] assistance could potentially be a possibility. TOC and Estill Bamberg will continue to follow up with [facility] available resources. [name] NP from PMT was contacted and she and I will meet with the pt and daughter on Monday at 11am to have continued disposition and [place] conversation.  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:  (truck)                   Prior Living Arrangements/Services Living arrangements for the past 2 months:  (truck) Lives with:: Self Patient language and need for interpreter reviewed:: Yes Do you feel safe going back to the place where you live?: Yes      Need for Family Participation in Patient Care: Yes (Comment) Care giver support system in place?: Yes  (comment)   Criminal Activity/Legal Involvement Pertinent to Current Situation/Hospitalization: No - Comment as needed  Activities of Daily Living Home Assistive Devices/Equipment: None ADL Screening (condition at time of admission) Patient's cognitive ability adequate to safely complete daily activities?: Yes Is the patient deaf or have difficulty hearing?: No Does the patient have difficulty seeing, even when wearing glasses/contacts?: No Does the patient have difficulty concentrating, remembering, or making decisions?: No Patient able to express need for assistance with ADLs?: Yes Does the patient have difficulty dressing or bathing?: No Independently performs ADLs?: No Communication: Independent Dressing (OT): Independent Grooming: Independent Feeding: Independent Bathing: Independent Toileting: Independent In/Out Bed: Needs assistance Is this a change from baseline?: Change from baseline, expected to last <3 days Walks in Home: Needs assistance Is this a change from baseline?: Change from baseline, expected to last <3 days Does the patient have difficulty walking or climbing stairs?: Yes Weakness of Legs: Both Weakness of Arms/Hands: None  Permission Sought/Granted                  Emotional Assessment Appearance:: Appears stated age Attitude/Demeanor/Rapport: Engaged, Complaining, Hostile Affect (typically observed): Overwhelmed, Frustrated Orientation: : Oriented to Self, Oriented to Place, Oriented to  Time, Oriented to Situation Alcohol / Substance Use: Not Applicable Psych Involvement: No (comment)  Admission diagnosis:  Lung mass [R91.8] Metastasis to  brain [facility]) [C79.31] Metastatic disease ([place]) [C79.9] Leg weakness, bilateral [R29.898] Closed T7 fracture ([place]) [S22.069A] Malignant neoplasm metastatic to bone [facility]) [C79.51] Patient Active Problem List   Diagnosis Date Noted  . 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 Brain metastases ([place])  09/23/2020  . Metastatic disease ([place]) 09/22/2020  . Renal insufficiency 09/22/2020  . Current smoker 09/22/2020  . Acute ischemic stroke ([place]) 09/22/2020  . Lung mass 09/22/2020  . Liver lesion 09/22/2020   PCP:  Patient, No Pcp Per Pharmacy:  No Pharmacies Listed    Social Determinants of Health (SDOH) Interventions    Readmission Risk Interventions No flowsheet data found.

## 2020-09-26 NOTE — Progress Notes (Signed)
Patient was transferred by [facility] to [facility].  Report given to [facility] and [place].

## 2020-09-27 ENCOUNTER — Ambulatory Visit
Admit: 2020-09-27 | Discharge: 2020-09-27 | Disposition: A | Discharge status: Home / Self Care | Payer: Self-pay | Attending: Radiation Oncology | Admitting: Radiation Oncology

## 2020-09-27 MED ORDER — ASPIRIN EC 81 MG PO TBEC
81.0000 mg | DELAYED_RELEASE_TABLET | Freq: Every day | ORAL | Status: DC
  Administered 2020-09-28: 81 mg via ORAL
  Filled 2020-09-27: qty 1

## 2020-09-27 MED ORDER — OXYCODONE HCL ER 15 MG PO T12A
15.0000 mg | EXTENDED_RELEASE_TABLET | Freq: Two times a day (BID) | ORAL | Status: DC
  Administered 2020-09-27 – 2020-10-07 (×19): 15 mg via ORAL
  Filled 2020-09-27 (×19): qty 1

## 2020-09-27 MED ORDER — POLYETHYLENE GLYCOL 3350 17 G PO PACK
17.0000 g | PACK | Freq: Every day | ORAL | Status: DC
  Administered 2020-09-28: 17 g via ORAL
  Filled 2020-09-27: qty 1

## 2020-09-27 NOTE — Progress Notes (Signed)
Physical Therapy Treatment Patient Details Name: Tyler Barker MRN: [id number] DOB: [birth year] Today's Date: 09/27/2020    History of Present Illness 59 yo male with workup revealing MRI (+) multiple vertebral metastases a large R parietal lobe metastatic lesion and multiple smaller enhancing brain metastases; a large hilar and mediastinal mass was partially intersected by the imaging planes MRI were a few scattered subcentimeter acute ischemic nonhemorrhagic infarcts involving the left frontal lobe, right periatrial white matter, and right occipital lobe. PMH hx back injury smoker    PT Comments    Pt is unwilling/unable to participate with PT. He is tangential and requires constant redirection. He states he is "moving  With nursing staff and does not have a problem with them". Nursing staff reports pt getting into stedy to urinate with max assist +2. This is the only time he mobilizes. Prior PT sessions have been extremely limited d/t pt unable and or unwilling to attempt to participate. He adamantly refused to attempt rolling, exercises or EOB at this time. Will attempt again next week and if unable to progress, will likely sign off   Follow Up Recommendations  SNF;Supervision/Assistance - 24 hour     Equipment Recommendations  None recommended by PT (defer to SNF )    Recommendations for Other Services       Precautions / Restrictions Precautions Precautions: Back;Fall Restrictions Weight Bearing Restrictions: No    Mobility  Bed Mobility               General bed mobility comments: refused to mobilize; reviewed log roll techniuqe, pt reports he is "shifting" in bed. cannot state whether or not he requires assist   Transfers                 General transfer comment: Pt refusing to attempt to get up out of bed this date. (using stedy per nursing staff, they have to provide ~ +2 max assist )  Ambulation/Gait                 Stairs              Wheelchair Mobility    Modified Rankin (Stroke Patients Only)       Balance                                            Cognition Arousal/Alertness: Awake/alert Behavior During Therapy: Agitated;Restless Overall Cognitive Status: Impaired/Different from baseline Area of Impairment: Attention;Memory;Following commands;Safety/judgement;Awareness;Problem solving                   Current Attention Level: Focused Memory: Decreased short-term memory;Decreased recall of precautions Following Commands: Follows one step commands inconsistently;Follows multi-step commands inconsistently Safety/Judgement: Decreased awareness of safety;Decreased awareness of deficits   Problem Solving: Slow processing;Difficulty sequencing General Comments: pt is tangential, requires constant re-direction to answer questions and refuses to follow basic commands. continues to refer to "you people" and "you are screwing up the schedule", states "it is not that I am not willing to move" even after multiple refusals to mobilize or move extremities (or even attempt).       Exercises      General Comments        Pertinent Vitals/Pain Pain Assessment: 0-10 Pain Score:  (does not rate when asked repeatedly, unable to move d/t pain) Pain Location: back Pain Intervention(s): Monitored during session  Home Living                      Prior Function            PT Goals (current goals can now be found in the care plan section) Acute Rehab PT Goals Patient Stated Goal: to get some answers and to walk and return to normal PT Goal Formulation: With patient/family Time For Goal Achievement: 10/08/20 Potential to Achieve Goals: Poor Progress towards PT goals: Not progressing toward goals - comment (unwillling/unable to participate d/t pain )    Frequency    Min 2X/week      PT Plan Current plan remains appropriate;Frequency needs to be updated     Co-evaluation              AM-PAC PT "6 Clicks" Mobility   Outcome Measure  Help needed turning from your back to your side while in a flat bed without using bedrails?: Total Help needed moving from lying on your back to sitting on the side of a flat bed without using bedrails?: Total Help needed moving to and from a bed to a chair (including a wheelchair)?: Total Help needed standing up from a chair using your arms (e.g., wheelchair or bedside chair)?: Total Help needed to walk in hospital room?: Total Help needed climbing 3-5 steps with a railing? : Total 6 Click Score: 6    End of Session   Activity Tolerance: Other (comment);Patient limited by pain (self limiting ) Patient left: in bed;with call bell/phone within reach;with bed alarm set   PT Visit Diagnosis: Muscle weakness (generalized) (M62.81);Other abnormalities of gait and mobility (R26.89)     Time: 4665-9935 PT Time Calculation (min) (ACUTE ONLY): 18 min  Charges:  $Self Care/Home Management: 8-22                     Baxter Flattery, PT  Acute Rehab Dept [facility]) [phone] Pager [phone]  09/27/2020    [facility] 09/27/2020, 2:37 PM

## 2020-09-27 NOTE — Progress Notes (Signed)
  Speech Language Pathology   Patient Details Name: Tyler Barker MRN: [id number] DOB: [birth year] Today's Date: 09/27/2020 Time:  -     Attempted speech-language-cognitive assessment with daughter present. Pt declined assessment, stated "they don't know when I had that stroke." Daughter stated pt "seemslike my dad" when asked about cognition. ST will sign off.                             Houston Siren 09/27/2020, 4:56 PM  Orbie Pyo Colvin Caroli.Ed Risk analyst [phone] Office [phone]

## 2020-09-27 NOTE — Progress Notes (Signed)
Chaplain responded to Tyler Barker for delivery and explanation of AD.  Tyler Barker perturbed not understanding who had sent me especially when his daughter is not there. Chaplain acknowledged it might be better to wait until his daughter is here to hear about the AD. Chaplain explained there was no obligation to hear about the AD.  Chaplain was Tyler Barker and supportive of Tyler Barker's choice of religion as he described why he did not support any organized religion.  Returning to subject of AD Chaplain asked if Tyler Barker wanted the AD to be left for his daughter to see. Tyler Barker agreed to leave the document but denied any need for explanation at this time.  Chaplain wrote a note on front cover advising daughter to have nurse page a Chaplain if/when she and Tyler Barker wanted to hear about AD.  [place]

## 2020-09-27 NOTE — Progress Notes (Signed)
PROGRESS NOTE    Tyler Barker  ION:[id number] DOB: [birth year] DOA: 09/21/2020 PCP: Patient, No Pcp Per    Chief Complaint  Patient presents with  . 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  Imaging in the emergency department revealed widely metastatic disease.  Underwent lymph node biopsy which revealed metastatic non-small cell lung cancer.  Seen by oncology, radiation oncology, palliative medicine, interventional radiology and neurosurgery.  Current recommendation is radiation therapy followed by kyphoplasty.  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 [facility]) Active Problems:   Metastatic disease ([place])   Renal insufficiency   Current smoker   Acute ischemic stroke ([place])   Lung mass   Liver lesion   Metastasis to brain [facility])   Palliative care by specialist   Cancer associated pain   DNR (do not resuscitate) discussion  New diagnosis of 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 (MRI personally reviewed showed hemorrhagic brain mets with associated edema) and pathologic fracture at T7 with possible cord compression -His transfer from [facility] to [place] for urgent radiation treatment due to patient is at risk of paralysis, plan for radiation therapy followed by kyphoplasty at T7 -he is also getting XRT to right chest mass with impending SVC syndrome, sacrum, and whole brain -Continue steroid -Appreciate oncology, radiation oncology, neurosurgery,  palliative care input  Few scattered subcentimeter acute ischemic nonhemorrhagic infarct -MRI imaging personally reviewed -Case discussed with neurology, recommend asa 81mg , already had echocardiogram   Leukocytosis likely steroid induced  CKD 3 a CT abdomen pelvis no obstructive nephropathy, bladder scan unremarkable Patient is at risk of urinary retention, will close monitor  Cancer related pain/nausea -Currently on Zofran as needed, Ativan if nausea not controlled by Zofran, consider zyprexa  -Continue as needed analgesic -increase  MS Contin, consider Neurontin, but will adjust medication gradually   Constipation No BM for a week, increased stool regimen    Very difficult situation with new diagnosis of metastatic disease, patient from [name] with no permanent home, 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  Truck currently undergoing repairs here in [place].  2 daughters very supportive Appreciated case management assistant   DVT prophylaxis: enoxaparin (LOVENOX) injection 40 mg Start: 09/22/20 1000   Code Status: Full Family Communication: Daughter Estill Bamberg at bedside on 11/12 Disposition:   Status is: Inpatient  Dispo: The patient is from: Home              Anticipated d/c is to: To be determined              Anticipated d/c date is: To be determined                Consultants:   Medical oncology  Radiation oncology  Neurosurgery  Neurology  Palliative care   Pulmonology  Interventional radiology  Procedures:  ULTRASOUND-GUIDED RIGHT SUPRACLAVICULAR LYMPH NODE BIOPSY on 11/8  Antimicrobials:   None     Objective: Vitals:   09/26/20 1435 09/26/20 2019 09/27/20 0506 09/27/20 1429  BP: (!) 152/84 129/80 (!) 150/102 (!) 132/102  Pulse: 65 87 90 89  Resp: 18 18 18 17   Temp: 98.2 F (36.8 C) 98.3 F (36.8 C) 98.2 F (36.8 C) (!) 97.5 F (36.4 C)  TempSrc: Oral Oral Oral Oral  SpO2: 100% 97% 95% 98%  Weight:      Height:         Intake/Output Summary (Last 24 hours) at 09/27/2020 1727 Last data filed at 09/26/2020 1925 Gross per 24 hour  Intake 240 ml  Output --  Net 240 ml   Filed Weights   09/21/20 1545  Weight: 90.7 kg    Examination:  General exam: Frustrated, in pain Respiratory system: Overall diminished breath sound, no wheezing, no rhonchi, no rales. Respiratory effort normal. Cardiovascular system: S1 & S2 heard, RRR. No pedal edema. Gastrointestinal system: Abdomen is nondistended, soft and nontender. Normal bowel sounds heard. Central nervous system: Alert and oriented.  Extremities: Not able to lift bilateral lower extremity against gravity, right foot drop Skin: No rashes, lesions or ulcers Psychiatry: Anxious and frustrated    Data Reviewed: I have personally reviewed following labs and imaging studies  CBC: Recent Labs  Lab 09/21/20 1605 09/22/20 0611 09/23/20 0405 09/24/20 0339 09/25/20 0345  WBC 12.0* 12.7* 9.8 15.9* 15.4*  NEUTROABS 6.5  --   --   --   --   HGB 15.7 15.0 14.4 13.0 12.1*  HCT 47.1 45.0 43.2 38.3* 35.7*  MCV 96.3 97.2 98.9 97.0 96.7  PLT 288 298 284 271 073    Basic Metabolic Panel: Recent Labs  Lab 09/21/20 1605 09/22/20 0611 09/23/20 0405 09/24/20 0339 09/25/20 0345  NA 136 135 134* 135 134*  K 3.7 4.2 4.0 4.7 4.5  CL 101 99 102 102 102  CO2 24 24 22 23 24   GLUCOSE 98 91 131* 129* 138*  BUN 15 19 20  27* 31*  CREATININE 1.76* 1.79* 1.87* 1.63* 1.64*  CALCIUM 9.5 9.3 9.0 9.0 8.6*  MG  --   --   --  2.3 2.0  PHOS  --   --   --  4.0 3.6    GFR: Estimated Creatinine Clearance: 56.3 mL/min (A) (by C-G formula based on SCr of 1.64 mg/dL (H)).  Liver Function Tests: Recent Labs  Lab 09/21/20 1605 09/24/20 0339  AST 29 30  ALT 22 23  ALKPHOS 105 83  BILITOT 0.9 0.7  PROT 7.9 6.3*  ALBUMIN 3.8 3.2*    CBG: No results for input(s): GLUCAP in the last 168 hours.   Recent Results (from the past 240 hour(s))  Respiratory Panel by  RT PCR (Flu A&B, Covid) - Nasopharyngeal Swab     Status: None   Collection Time: 09/22/20  2:38 AM   Specimen: Nasopharyngeal Swab  Result Value Ref Range Status   SARS Coronavirus 2 by RT PCR NEGATIVE NEGATIVE Final    Comment: (NOTE) SARS-CoV-2 target nucleic acids are NOT DETECTED.  The SARS-CoV-2 RNA is generally detectable in upper respiratoy specimens during the acute phase of infection. The lowest concentration of SARS-CoV-2 viral copies this assay can detect is 131 copies/mL. A negative result does not preclude SARS-Cov-2 infection and should not be used as the sole basis for treatment or other patient management decisions. A negative result may occur with  improper specimen collection/handling, submission of specimen other than nasopharyngeal swab, presence of viral mutation(s) within the areas targeted by this assay, and inadequate number of viral copies (<131 copies/mL). A negative  result must be combined with clinical observations, patient history, and epidemiological information. The expected result is Negative.  Fact Sheet for Patients:  PinkCheek.be  Fact Sheet for Healthcare Providers:  GravelBags.it  This test is no t yet approved or cleared by the [place] FDA and  has been authorized for detection and/or diagnosis of SARS-CoV-2 by FDA under an Emergency Use Authorization (EUA). This EUA will remain  in effect (meaning this test can be used) for the duration of the COVID-19 declaration under Section 564(b)(1) of the Act, 21 U.S.C. section 360bbb-3(b)(1), unless the authorization is terminated or revoked sooner.     Influenza A by PCR NEGATIVE NEGATIVE Final   Influenza B by PCR NEGATIVE NEGATIVE Final    Comment: (NOTE) The Xpert Xpress SARS-CoV-2/FLU/RSV assay is intended as an aid in  the diagnosis of influenza from Nasopharyngeal swab specimens and  should not be used as a sole basis for  treatment. Nasal washings and  aspirates are unacceptable for Xpert Xpress SARS-CoV-2/FLU/RSV  testing.  Fact Sheet for Patients: PinkCheek.be  Fact Sheet for Healthcare Providers: GravelBags.it  This test is not yet approved or cleared by the [place] FDA and  has been authorized for detection and/or diagnosis of SARS-CoV-2 by  FDA under an Emergency Use Authorization (EUA). This EUA will remain  in effect (meaning this test can be used) for the duration of the  Covid-19 declaration under Section 564(b)(1) of the Act, 21  U.S.C. section 360bbb-3(b)(1), unless the authorization is  terminated or revoked. Performed at [facility], [place] [address], [place], [place] [address]          Radiology Studies: No results found.      Scheduled Meds: . [START ON 09/28/2020] aspirin EC  81 mg Oral Daily  . dexamethasone (DECADRON) injection  4 mg Intravenous Q12H  . enoxaparin (LOVENOX) injection  40 mg Subcutaneous Daily  . guaiFENesin  600 mg Oral BID  . oxyCODONE  15 mg Oral Q12H  . [START ON 09/28/2020] polyethylene glycol  17 g Oral Daily  . senna-docusate  1 tablet Oral BID   Continuous Infusions:   LOS: 5 days   Time spent: 66mins Greater than 50% of this time was spent in counseling, explanation of diagnosis, planning of further management, and coordination of care.  I have personally reviewed and interpreted on  09/27/2020 daily labs, I reviewed all nursing notes, pharmacy notes, consultant notes,  vitals, pertinent old records  I have discussed plan of care as described above with RN , patient and family on 09/27/2020  Voice Recognition /Dragon dictation system was used to create this note, attempts have been made to correct errors. Please contact the author with questions and/or clarifications.   Florencia Reasons, MD PhD FACP [facility]  Available via Epic secure chat 7am-7pm for nonurgent  issues Please page for urgent issues To page the attending provider between 7A-7P or the covering provider during after hours 7P-7A, please log into the web site [email or web address] and access using universal [place] password for that web site. If you do not have the password, please call the hospital operator.    09/27/2020, 5:27 PM

## 2020-09-28 LAB — CBC WITH DIFFERENTIAL/PLATELET
Abs Immature Granulocytes: 0.09 10*3/uL — ABNORMAL HIGH (ref 0.00–0.07)
Abs Immature Granulocytes: 0.17 10*3/uL — ABNORMAL HIGH (ref 0.00–0.07)
Basophils Absolute: 0 10*3/uL (ref 0.0–0.1)
Basophils Absolute: 0 10*3/uL (ref 0.0–0.1)
Basophils Relative: 0 %
Basophils Relative: 0 %
Eosinophils Absolute: 0 10*3/uL (ref 0.0–0.5)
Eosinophils Absolute: 0 10*3/uL (ref 0.0–0.5)
Eosinophils Relative: 0 %
Eosinophils Relative: 0 %
HCT: 38.6 % — ABNORMAL LOW (ref 39.0–52.0)
HCT: 34.2 % — ABNORMAL LOW (ref 39.0–52.0)
Hemoglobin: 13.2 g/dL (ref 13.0–17.0)
Hemoglobin: 11.3 g/dL — ABNORMAL LOW (ref 13.0–17.0)
Immature Granulocytes: 1 %
Immature Granulocytes: 1 %
Lymphocytes Relative: 9 %
Lymphocytes Relative: 6 %
Lymphs Abs: 1.3 10*3/uL (ref 0.7–4.0)
Lymphs Abs: 1 10*3/uL (ref 0.7–4.0)
MCH: 32.9 pg (ref 26.0–34.0)
MCH: 32.3 pg (ref 26.0–34.0)
MCHC: 34.2 g/dL (ref 30.0–36.0)
MCHC: 33 g/dL (ref 30.0–36.0)
MCV: 96.3 fL (ref 80.0–100.0)
MCV: 97.7 fL (ref 80.0–100.0)
Monocytes Absolute: 1.2 10*3/uL — ABNORMAL HIGH (ref 0.1–1.0)
Monocytes Absolute: 1.6 10*3/uL — ABNORMAL HIGH (ref 0.1–1.0)
Monocytes Relative: 8 %
Monocytes Relative: 9 %
Neutro Abs: 11.6 10*3/uL — ABNORMAL HIGH (ref 1.7–7.7)
Neutro Abs: 14.9 10*3/uL — ABNORMAL HIGH (ref 1.7–7.7)
Neutrophils Relative %: 82 %
Neutrophils Relative %: 84 %
Platelets: 287 10*3/uL (ref 150–400)
Platelets: 265 10*3/uL (ref 150–400)
RBC: 4.01 MIL/uL — ABNORMAL LOW (ref 4.22–5.81)
RBC: 3.5 MIL/uL — ABNORMAL LOW (ref 4.22–5.81)
RDW: 12.5 % (ref 11.5–15.5)
RDW: 12.5 % (ref 11.5–15.5)
WBC: 14.1 10*3/uL — ABNORMAL HIGH (ref 4.0–10.5)
WBC: 17.8 10*3/uL — ABNORMAL HIGH (ref 4.0–10.5)
nRBC: 0 % (ref 0.0–0.2)
nRBC: 0 % (ref 0.0–0.2)

## 2020-09-28 LAB — BASIC METABOLIC PANEL
Anion gap: 7 (ref 5–15)
BUN: 48 mg/dL — ABNORMAL HIGH (ref 6–20)
CO2: 29 mmol/L (ref 22–32)
Calcium: 8.6 mg/dL — ABNORMAL LOW (ref 8.9–10.3)
Chloride: 95 mmol/L — ABNORMAL LOW (ref 98–111)
Creatinine, Ser: 1.68 mg/dL — ABNORMAL HIGH (ref 0.61–1.24)
GFR, Estimated: 47 mL/min — ABNORMAL LOW (ref >60)
Glucose, Bld: 115 mg/dL — ABNORMAL HIGH (ref 70–99)
Potassium: 5.5 mmol/L — ABNORMAL HIGH (ref 3.5–5.1)
Sodium: 131 mmol/L — ABNORMAL LOW (ref 135–145)

## 2020-09-28 MED ORDER — TAMSULOSIN HCL 0.4 MG PO CAPS
0.4000 mg | ORAL_CAPSULE | Freq: Every day | ORAL | Status: DC
  Administered 2020-09-28 – 2020-10-11 (×14): 0.4 mg via ORAL
  Filled 2020-09-28 (×14): qty 1

## 2020-09-28 MED ORDER — OLANZAPINE 5 MG PO TABS
2.5000 mg | ORAL_TABLET | Freq: Every day | ORAL | Status: DC
  Administered 2020-09-28 – 2020-10-11 (×8): 2.5 mg via ORAL
  Filled 2020-09-28 (×11): qty 1

## 2020-09-28 MED ORDER — CHLORPROMAZINE HCL 10 MG PO TABS
10.0000 mg | ORAL_TABLET | Freq: Four times a day (QID) | ORAL | Status: DC | PRN
  Administered 2020-09-28 – 2020-10-11 (×17): 10 mg via ORAL
  Filled 2020-09-28 (×24): qty 1

## 2020-09-28 MED ORDER — POLYETHYLENE GLYCOL 3350 17 G PO PACK
17.0000 g | PACK | Freq: Two times a day (BID) | ORAL | Status: DC
  Administered 2020-10-02: 17 g via ORAL
  Filled 2020-09-28 (×3): qty 1

## 2020-09-28 MED ORDER — SODIUM ZIRCONIUM CYCLOSILICATE 5 G PO PACK
5.0000 g | PACK | Freq: Once | ORAL | Status: AC
  Administered 2020-09-28: 5 g via ORAL
  Filled 2020-09-28: qty 1

## 2020-09-28 MED ORDER — LACTATED RINGERS IV BOLUS
1000.0000 mL | Freq: Once | INTRAVENOUS | Status: AC
  Administered 2020-09-28: 1000 mL via INTRAVENOUS

## 2020-09-28 NOTE — Progress Notes (Signed)
Patient had incontinent episode of diarrhea that appeared dark red and dark brown.  No pain associated with BM. NP on call notified.  Will continue to monitor.

## 2020-09-28 NOTE — Progress Notes (Signed)
   09/28/20 2113  Assess: MEWS Score  Temp 97.9 F (36.6 C)  BP (!) 81/66  Pulse Rate (!) 110  Resp 16  Level of Consciousness Alert  SpO2 98 %  O2 Device Room Air  Patient Activity (if Appropriate) In bed  Assess: MEWS Score  MEWS Temp 0  MEWS Systolic 1  MEWS Pulse 1  MEWS RR 0  MEWS LOC 0  MEWS Score 2  MEWS Score Color Yellow  Assess: if the MEWS score is Yellow or Red  Were vital signs taken at a resting state? Yes  Focused Assessment Change from prior assessment (see assessment flowsheet)  Early Detection of Sepsis Score *See Row Information* Medium  MEWS guidelines implemented *See Row Information* Yes  Treat  MEWS Interventions Administered prn meds/treatments;Administered scheduled meds/treatments  Pain Scale 0-10  Pain Score 8  Pain Type Acute pain  Pain Location Chest  Pain Orientation Left;Upper;Lower  Pain Descriptors / Indicators Aching;Discomfort  Pain Frequency Constant  Pain Onset On-going  Patients Stated Pain Goal 2  Pain Intervention(s) Medication (See eMAR);Repositioned  Breathing 0  Negative Vocalization 0  Facial Expression 1  Body Language 0  Consolability 1  PAINAD Score 2  Neuro symptoms relieved by Anti-anxiety medication;Rest  Constipation interventions Laxative  Patients response to intervention Relief  Take Vital Signs  Increase Vital Sign Frequency  Yellow: Q 2hr X 2 then Q 4hr X 2, if remains yellow, continue Q 4hrs  Escalate  MEWS: Escalate Yellow: discuss with charge nurse/RN and consider discussing with provider and RRT  Notify: Charge Nurse/RN  Name of Charge Nurse/RN Notified [place]  Date Charge Nurse/RN Notified 09/28/20  Time Charge Nurse/RN Notified 2130  Document  Patient Outcome Stabilized after interventions (1000 ml bolus infusing )  patient denies SOB/ new chest pain, did had a incontinent episode of bloody diarrhea just 30 minutes ago. NP paged.  1000 ml bolus started at 2200 will follow yellow MEWS protocols.  Patient resting comfortably now

## 2020-09-28 NOTE — Progress Notes (Signed)
PROGRESS NOTE    Ladd Cen  FIE:[id number] DOB: 1961-09-29 DOA: 09/21/2020 PCP: Patient, No Pcp Per    Chief Complaint  Patient presents with  . 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  Imaging in the emergency department revealed widely metastatic disease.  Underwent lymph node biopsy which revealed metastatic non-small cell lung cancer.  Seen by oncology, radiation oncology, palliative medicine, interventional radiology and neurosurgery.  Current recommendation is radiation therapy followed by kyphoplasty.  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 [facility]) Active Problems:   Metastatic disease ([place])   Renal insufficiency   Current smoker   Acute ischemic stroke ([place])   Lung mass   Liver lesion   Metastasis to brain [facility])   Palliative care by specialist   Cancer associated pain   DNR (do not resuscitate) discussion  New diagnosis of 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 (MRI personally reviewed showed hemorrhagic brain mets with associated edema) and pathologic fracture at T7 with possible cord compression -His transfer from [facility] to [place] for urgent radiation treatment due to patient is at risk of paralysis, plan for radiation therapy followed by kyphoplasty at T7 -he is also  getting XRT to right chest mass with impending SVC syndrome, sacrum, and whole brain -Continue steroid -Appreciate oncology, radiation oncology, neurosurgery, palliative care input  Few scattered subcentimeter acute ischemic nonhemorrhagic infarct -MRI imaging personally reviewed -Case discussed with neurology, recommend asa 81mg , already had echocardiogram   Leukocytosis likely steroid induced  Mild hyperkalemia lokelma x1, repeat bmp in am  CKD 3 a CT abdomen pelvis no obstructive nephropathy, bladder scan unremarkable Patient is at risk of urinary retention, will close monitor  Cancer related pain/nausea -Currently on Zofran as needed, Ativan if nausea not controlled by Zofran, start low dose  zyprexa qhs -Continue as needed analgesic -increased  MS Contin, consider Neurontin, but will adjust medication gradually   Constipation No BM for a week, increased stool regimen  Very difficult situation with new diagnosis of metastatic disease, patient from [name] with no permanent home, 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  Truck currently undergoing repairs here in [place].  2 daughters very supportive Appreciated case management assistant   DVT prophylaxis: enoxaparin (LOVENOX) injection 40 mg Start: 09/22/20 1000   Code Status: Full Family Communication: Daughter Estill Bamberg at bedside on 11/12 Disposition:   Status is: Inpatient  Dispo: The patient is from: Home              Anticipated d/c is to: To be determined              Anticipated d/c date is: To be determined                Consultants:   Medical oncology  Radiation oncology  Neurosurgery  Neurology  Palliative care   Pulmonology  Interventional radiology  Procedures:  ULTRASOUND-GUIDED RIGHT SUPRACLAVICULAR LYMPH NODE BIOPSY on 11/8  Antimicrobials:   None     Objective: Vitals:   09/27/20 0506 09/27/20 1429 09/27/20 2257 09/28/20 0632  BP: (!) 150/102 (!) 132/102 (!) 108/91 103/63    Pulse: 90 89 85 83  Resp: 18 17 18 18   Temp: 98.2 F (36.8 C) (!) 97.5 F (36.4 C) 98.8 F (37.1 C) 99.1 F (37.3 C)  TempSrc: Oral Oral Oral Oral  SpO2: 95% 98% 97% 95%  Weight:    83.7 kg  Height:        Intake/Output Summary (Last 24 hours) at 09/28/2020 0900 Last data filed at 09/28/2020 2229 Gross per 24 hour  Intake 835 ml  Output --  Net 835 ml   Filed Weights   09/21/20 1545 09/28/20 7989  Weight: 90.7 kg 83.7 kg    Examination:  General exam: Frustrated, in pain Respiratory system: Overall diminished breath sound, no wheezing, no rhonchi, no rales. Respiratory effort normal. Cardiovascular system: S1 & S2 heard, RRR. No pedal edema. Gastrointestinal system: Abdomen is nondistended, soft and nontender. Normal bowel sounds heard. Central nervous system: Alert and oriented.  Extremities: Not able to lift bilateral lower extremity against gravity, right foot drop Skin: No rashes, lesions or ulcers Psychiatry: Anxious and frustrated    Data Reviewed: I have personally reviewed following labs and imaging studies  CBC: Recent Labs  Lab 09/21/20 1605 09/21/20 1605 09/22/20 0611 09/23/20 0405 09/24/20 0339 09/25/20 0345 09/28/20 0800  WBC 12.0*   < > 12.7* 9.8 15.9* 15.4* 14.1*  NEUTROABS 6.5  --   --   --   --   --  11.6*  HGB 15.7   < > 15.0 14.4 13.0 12.1* 13.2  HCT 47.1   < > 45.0 43.2 38.3* 35.7* 38.6*  MCV 96.3   < > 97.2 98.9 97.0 96.7 96.3  PLT 288   < > 298 284 271 270 287   < > = values in this interval not displayed.    Basic Metabolic Panel: Recent Labs  Lab 09/22/20 0611 09/23/20 0405 09/24/20 0339 09/25/20 0345 09/28/20 0800  NA 135 134* 135 134* 131*  K 4.2 4.0 4.7 4.5 5.5*  CL 99 102 102 102 95*  CO2 24 22 23 24 29   GLUCOSE 91 131* 129* 138* 115*  BUN 19 20 27* 31* 48*  CREATININE 1.79* 1.87* 1.63* 1.64* 1.68*  CALCIUM 9.3 9.0 9.0 8.6* 8.6*  MG  --   --  2.3 2.0  --   PHOS  --   --  4.0 3.6  --     GFR: Estimated  Creatinine Clearance: 50.1 mL/min (A) (by C-G formula based on SCr of 1.68 mg/dL (H)).  Liver Function Tests: Recent Labs  Lab 09/21/20 1605 09/24/20 0339  AST 29 30  ALT 22 23  ALKPHOS 105 83  BILITOT 0.9 0.7  PROT 7.9 6.3*  ALBUMIN 3.8 3.2*    CBG: No results for input(s): GLUCAP in the last 168 hours.   Recent Results (from the past 240 hour(s))  Respiratory Panel by RT PCR (Flu A&B, Covid) - Nasopharyngeal Swab     Status: None   Collection Time: 09/22/20  2:38 AM   Specimen: Nasopharyngeal Swab  Result Value Ref Range Status   SARS Coronavirus 2 by RT PCR NEGATIVE NEGATIVE Final    Comment: (NOTE) SARS-CoV-2 target nucleic acids are NOT DETECTED.  The SARS-CoV-2 RNA is generally detectable in upper respiratoy specimens during the acute phase of infection. The lowest concentration of SARS-CoV-2 viral copies this assay can detect is 131 copies/mL. A negative result does not preclude SARS-Cov-2 infection and should not be used as the sole basis for treatment or other patient management decisions. A negative result may occur with  improper specimen collection/handling, submission of specimen other than nasopharyngeal swab, presence of viral mutation(s) within the areas targeted by this assay, and inadequate number of viral copies (<131 copies/mL). A negative result must be combined with clinical observations, patient history, and epidemiological information. The expected result is Negative.  Fact Sheet for Patients:  PinkCheek.be  Fact Sheet for Healthcare Providers:  GravelBags.it  This test is no t yet approved or cleared by the [place] FDA and  has been authorized for detection and/or diagnosis of SARS-CoV-2 by FDA under an Emergency Use Authorization (EUA). This EUA will remain  in effect (meaning this test can be used) for the duration of the COVID-19 declaration under Section 564(b)(1) of the Act,  21 U.S.C. section 360bbb-3(b)(1), unless the authorization is terminated or revoked sooner.     Influenza A by PCR NEGATIVE NEGATIVE Final   Influenza B by PCR NEGATIVE NEGATIVE Final    Comment: (NOTE) The Xpert Xpress SARS-CoV-2/FLU/RSV assay is intended as an aid in  the diagnosis of influenza from Nasopharyngeal swab specimens and  should not be used as a sole basis for treatment. Nasal washings and  aspirates are unacceptable for Xpert Xpress SARS-CoV-2/FLU/RSV  testing.  Fact Sheet for Patients: PinkCheek.be  Fact Sheet for Healthcare Providers: GravelBags.it  This test is not yet approved or cleared by the [place] FDA and  has been authorized for detection and/or diagnosis of SARS-CoV-2 by  FDA under an Emergency Use Authorization (EUA). This EUA will remain  in effect (meaning this test can be used) for the duration of the  Covid-19 declaration under Section 564(b)(1) of the Act, 21  U.S.C. section 360bbb-3(b)(1), unless the authorization is  terminated or revoked. Performed at [facility], [place] [address] [place], [place] [address]          Radiology Studies: No results found.      Scheduled Meds: . aspirin EC  81 mg Oral Daily  . dexamethasone (DECADRON) injection  4 mg Intravenous Q12H  . enoxaparin (LOVENOX) injection  40 mg Subcutaneous Daily  . guaiFENesin  600 mg Oral BID  . oxyCODONE  15 mg Oral Q12H  . polyethylene glycol  17 g Oral Daily  . senna-docusate  1 tablet Oral BID  . sodium zirconium cyclosilicate  5 g Oral Once   Continuous Infusions:   LOS: 6 days   Time spent: 44mins Greater than 50% of this time was spent in counseling, explanation of diagnosis, planning of further management, and coordination of care.  I have personally reviewed and interpreted on  09/28/2020 daily labs, I reviewed all nursing notes, pharmacy notes, consultant notes,  vitals, pertinent  old records  I have discussed plan of care as described above with RN , patient and family on 09/28/2020  Voice Recognition /Dragon dictation system was used to create this note, attempts have been made to correct errors. Please contact the author with questions and/or clarifications.   Florencia Reasons, MD PhD FACP [facility]  Available via Epic secure chat 7am-7pm for nonurgent issues Please page for urgent issues To page the attending provider between 7A-7P or the covering  provider during after hours 7P-7A, please log into the web site [email or web address] and access using universal [place] password for that web site. If you do not have the password, please call the hospital operator.    09/28/2020, 9:00 AM

## 2020-09-29 ENCOUNTER — Inpatient Hospital Stay (HOSPITAL_COMMUNITY): Payer: Self-pay

## 2020-09-29 DIAGNOSIS — K921 Melena: Secondary | ICD-10-CM

## 2020-09-29 DIAGNOSIS — D500 Iron deficiency anemia secondary to blood loss (chronic): Secondary | ICD-10-CM

## 2020-09-29 DIAGNOSIS — C7951 Secondary malignant neoplasm of bone: Principal | ICD-10-CM

## 2020-09-29 DIAGNOSIS — C3490 Malignant neoplasm of unspecified part of unspecified bronchus or lung: Secondary | ICD-10-CM

## 2020-09-29 LAB — BASIC METABOLIC PANEL
Anion gap: 11 (ref 5–15)
Anion gap: 6 (ref 5–15)
BUN: 80 mg/dL — ABNORMAL HIGH (ref 6–20)
BUN: 77 mg/dL — ABNORMAL HIGH (ref 6–20)
CO2: 23 mmol/L (ref 22–32)
CO2: 21 mmol/L — ABNORMAL LOW (ref 22–32)
Calcium: 8.1 mg/dL — ABNORMAL LOW (ref 8.9–10.3)
Calcium: 7.2 mg/dL — ABNORMAL LOW (ref 8.9–10.3)
Chloride: 92 mmol/L — ABNORMAL LOW (ref 98–111)
Chloride: 97 mmol/L — ABNORMAL LOW (ref 98–111)
Creatinine, Ser: 2.34 mg/dL — ABNORMAL HIGH (ref 0.61–1.24)
Creatinine, Ser: 2.04 mg/dL — ABNORMAL HIGH (ref 0.61–1.24)
GFR, Estimated: 32 mL/min — ABNORMAL LOW (ref >60)
GFR, Estimated: 37 mL/min — ABNORMAL LOW (ref >60)
Glucose, Bld: 116 mg/dL — ABNORMAL HIGH (ref 70–99)
Glucose, Bld: 122 mg/dL — ABNORMAL HIGH (ref 70–99)
Potassium: 5.9 mmol/L — ABNORMAL HIGH (ref 3.5–5.1)
Potassium: 5.1 mmol/L (ref 3.5–5.1)
Sodium: 126 mmol/L — ABNORMAL LOW (ref 135–145)
Sodium: 124 mmol/L — ABNORMAL LOW (ref 135–145)

## 2020-09-29 LAB — CBC
HCT: 28.7 % — ABNORMAL LOW (ref 39.0–52.0)
Hemoglobin: 9.7 g/dL — ABNORMAL LOW (ref 13.0–17.0)
MCH: 33.1 pg (ref 26.0–34.0)
MCHC: 33.8 g/dL (ref 30.0–36.0)
MCV: 98 fL (ref 80.0–100.0)
Platelets: 245 10*3/uL (ref 150–400)
RBC: 2.93 MIL/uL — ABNORMAL LOW (ref 4.22–5.81)
RDW: 12.3 % (ref 11.5–15.5)
WBC: 19.4 10*3/uL — ABNORMAL HIGH (ref 4.0–10.5)
nRBC: 0 % (ref 0.0–0.2)

## 2020-09-29 LAB — PROTIME-INR
INR: 1.1 (ref 0.8–1.2)
Prothrombin Time: 13.6 seconds (ref 11.4–15.2)

## 2020-09-29 LAB — HEMOGLOBIN AND HEMATOCRIT, BLOOD
HCT: 24.6 % — ABNORMAL LOW (ref 39.0–52.0)
Hemoglobin: 8.7 g/dL — ABNORMAL LOW (ref 13.0–17.0)

## 2020-09-29 LAB — ABO/RH: ABO/RH(D): A POS

## 2020-09-29 LAB — MRSA PCR SCREENING: MRSA by PCR: NEGATIVE

## 2020-09-29 LAB — GLUCOSE, CAPILLARY: Glucose-Capillary: 109 mg/dL — ABNORMAL HIGH (ref 70–99)

## 2020-09-29 LAB — PREPARE RBC (CROSSMATCH)

## 2020-09-29 MED ORDER — SODIUM CHLORIDE 0.9 % IV BOLUS
1000.0000 mL | Freq: Once | INTRAVENOUS | Status: AC
  Administered 2020-09-29: 1000 mL via INTRAVENOUS

## 2020-09-29 MED ORDER — PEG-KCL-NACL-NASULF-NA ASC-C 100 G PO SOLR
0.5000 | Freq: Once | ORAL | Status: DC
  Filled 2020-09-29: qty 1

## 2020-09-29 MED ORDER — SODIUM CHLORIDE 0.9% IV SOLUTION: Freq: Once | INTRAVENOUS | Status: DC

## 2020-09-29 MED ORDER — PEG-KCL-NACL-NASULF-NA ASC-C 100 G PO SOLR: 0.5000 | Freq: Once | ORAL | Status: DC

## 2020-09-29 MED ORDER — CHLORHEXIDINE GLUCONATE CLOTH 2 % EX PADS
6.0000 | MEDICATED_PAD | Freq: Every day | CUTANEOUS | Status: DC
  Administered 2020-09-30 – 2020-10-11 (×10): 6 via TOPICAL

## 2020-09-29 MED ORDER — PEG-KCL-NACL-NASULF-NA ASC-C 100 G PO SOLR: 1.0000 | Freq: Once | ORAL | Status: DC

## 2020-09-29 MED ORDER — LACTATED RINGERS IV SOLN: INTRAVENOUS | Status: DC

## 2020-09-29 MED ORDER — PANTOPRAZOLE SODIUM 40 MG IV SOLR: 40.0000 mg | Freq: Two times a day (BID) | INTRAVENOUS | Status: DC

## 2020-09-29 MED ORDER — SODIUM CHLORIDE 0.9 % IV SOLN: INTRAVENOUS | Status: AC

## 2020-09-29 MED ORDER — ORAL CARE MOUTH RINSE
15.0000 mL | Freq: Two times a day (BID) | OROMUCOSAL | Status: DC
  Administered 2020-09-29 – 2020-10-11 (×17): 15 mL via OROMUCOSAL

## 2020-09-29 MED ORDER — SODIUM CHLORIDE 0.9 % IV SOLN
80.0000 mg | Freq: Once | INTRAVENOUS | Status: AC
  Administered 2020-09-29: 08:00:00 80 mg via INTRAVENOUS
  Filled 2020-09-29: qty 80

## 2020-09-29 MED ORDER — LACTATED RINGERS IV BOLUS
500.0000 mL | Freq: Once | INTRAVENOUS | Status: AC
  Administered 2020-09-29: 500 mL via INTRAVENOUS

## 2020-09-29 MED ORDER — SODIUM CHLORIDE 0.9 % IV SOLN
8.0000 mg/h | INTRAVENOUS | Status: AC
  Administered 2020-09-29 – 2020-10-02 (×7): 8 mg/h via INTRAVENOUS
  Filled 2020-09-29 (×10): qty 80

## 2020-09-29 NOTE — Progress Notes (Signed)
Patient is being moved to stepdown ICU due to blood loss in stool, low blood pressures and a decline in mental status. Yellow MEWS implemented

## 2020-09-29 NOTE — Consult Note (Signed)
Consultation  Referring Provider: Dr. Erlinda Hong  Primary Care Physician:  Patient, No Pcp Per Primary Gastroenterologist: Althia Forts        Reason for Consultation: Hematochezia, anemia            HPI:   Tyler Barker is a 59 y.o. 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  Regards to a finding of hematochezia and anemia overnight.    In the ED he had a noncontrasted head CT with raise concern for acute infarction involving the posterior right parietal lobe also notable for small old infarctions in the left cerebellum.  MRI of concern for multiple scattered enhancing lesions throughout the brain consistent with metastatic disease as well as a few scattered subcentimeter acute infarcts involving the left frontal lobe.  Also revealed osseous metastases.  MRI cervical, lumbar and thoracic spine as well as MRI sacrum and SI joints are all notable for osseous metastases and soft tissue implants as well as a right hilar mass, multiple pulmonary nodules and suspected liver lesions.  He has undergone biopsy which revealed metastatic non-small cell lung cancer.  He has been seen by oncology, radiation oncology, palliative medicine, interventional radiology neurosurgery.    Today, the patient is a very poor historian and seems somewhat incoherent to what happened, he tells me that they "suspect he has had bleeding", but really cannot tell me much else.  The hospital team describes that the patient had bloody bowel movements x3 overnight, the last one this morning around 7:00 was at shift change (though I can see some dark red/bright red blood mixture around his rectum now), blood pressure dropped this morning and he is receiving a fluid bolus.  Hemoglobin level dropped from 11.3 yesterday to 9.7 this morning.  His Lovenox and aspirin are now on hold.   Prior to this there are reports that he had constipation for 10 days.    Denies previous colonoscopy or GI work-up.    Denies fever, chills, abdominal or rectal pain.  Past Medical History:  Diagnosis Date  . Medical history non-contributory     Past Surgical History:  Procedure Laterality Date  . APPENDECTOMY  1979    Family History  Problem Relation Age of Onset  . Diabetes Maternal Aunt   . Alcoholism Paternal Grandmother      Social History   Tobacco Use  . Smoking status: Current Every Day Smoker    Packs/day: 1.00  . Smokeless tobacco: Never Used  Substance Use Topics  . Alcohol use: Not Currently    Comment: quit in 1988 after DUI   . Drug use: Not Currently    Prior to Admission medications   Medication Sig Start Date End Date Taking? Authorizing Provider  naproxen sodium (ALEVE) 220 MG tablet Take 220 mg by mouth 2 (two) times daily as needed (pain).   Yes [provider]    Current Facility-Administered Medications  Medication Dose Route Frequency Provider Last Rate Last Admin  . 0.9 %  sodium chloride infusion (Manually program via Guardrails IV Fluids)   Intravenous Once Neila Gear, NP   Held at 09/29/20 [phone]  . 0.9 %  sodium chloride infusion   Intravenous Continuous Florencia Reasons, MD      . acetaminophen (TYLENOL) tablet 650 mg  650 mg Oral Q4H PRN Samuella Cota, MD  Or  . acetaminophen (TYLENOL) 160 MG/5ML solution 650 mg  650 mg Per Tube Q4H PRN Samuella Cota, MD       Or  . acetaminophen (TYLENOL) suppository 650 mg  650 mg Rectal Q4H PRN Samuella Cota, MD      . chlorproMAZINE (THORAZINE) tablet 10 mg  10 mg Oral QID PRN Florencia Reasons, MD   10 mg at 09/29/20 0609  . dexamethasone (DECADRON) injection 4 mg  4 mg Intravenous Q12H Samuella Cota, MD   4 mg at 09/29/20 0819  . guaiFENesin (MUCINEX) 12 hr tablet 600 mg  600 mg Oral BID Florencia Reasons, MD   600 mg at 09/28/20 2200  . HYDROmorphone (DILAUDID) injection 0.5-1 mg  0.5-1  mg Intravenous Q4H PRN Samuella Cota, MD   1 mg at 09/28/20 2159  . LORazepam (ATIVAN) tablet 0.5 mg  0.5 mg Oral Q4H PRN Hayden Pedro, PA-C   0.5 mg at 09/29/20 0201  . OLANZapine (ZYPREXA) tablet 2.5 mg  2.5 mg Oral QHS Florencia Reasons, MD   2.5 mg at 09/28/20 2158  . ondansetron (ZOFRAN) injection 4 mg  4 mg Intravenous Q6H PRN Samuella Cota, MD   4 mg at 09/28/20 1814  . oxyCODONE (Oxy IR/ROXICODONE) immediate release tablet 5 mg  5 mg Oral Q4H PRN Samuella Cota, MD   5 mg at 09/29/20 0201  . oxyCODONE (OXYCONTIN) 12 hr tablet 15 mg  15 mg Oral Q12H Florencia Reasons, MD   15 mg at 09/28/20 2200  . pantoprazole (PROTONIX) 80 mg in sodium chloride 0.9 % 100 mL (0.8 mg/mL) infusion  8 mg/hr Intravenous Continuous Blount, Xenia T, NP      . pantoprazole (PROTONIX) 80 mg in sodium chloride 0.9 % 100 mL IVPB  80 mg Intravenous Once Lovey Newcomer T, NP 300 mL/hr at 09/29/20 0819 80 mg at 09/29/20 0819  . [START ON 10/02/2020] pantoprazole (PROTONIX) injection 40 mg  40 mg Intravenous Q12H Blount, Xenia T, NP      . polyethylene glycol (MIRALAX / GLYCOLAX) packet 17 g  17 g Oral BID Florencia Reasons, MD      . senna-docusate (Senokot-S) tablet 1 tablet  1 tablet Oral BID Florencia Reasons, MD   1 tablet at 09/28/20 2158  . tamsulosin (FLOMAX) capsule 0.4 mg  0.4 mg Oral Daily Florencia Reasons, MD   0.4 mg at 09/29/20 0819    Allergies as of 09/21/2020  . (Not on File)     Review of Systems:    Constitutional: No fever or chills Skin: No rash  Cardiovascular: No chest pain Respiratory: No SOB Gastrointestinal: See HPI and otherwise negative Genitourinary: No dysuria Neurological: No syncope Musculoskeletal: No new muscle or joint pain Hematologic: See HPI Psychiatric: No history of depression or anxiety had a good   Physical Exam:  Vital signs in last 24 hours: Temp:  [97.7 F (36.5 C)-97.9 F (36.6 C)] 97.7 F (36.5 C) (11/14 0729) Pulse Rate:  [84-110] 84 (11/14 0729) Resp:  [16-18] 18 (11/14  0729) BP: (75-96)/(54-66) 91/55 (11/14 0729) SpO2:  [92 %-98 %] 98 % (11/14 0729) Weight:  [85.8 kg] 85.8 kg (11/14 0700) Last BM Date: 09/28/20 (dark red/ brown) General:   Pleasant Caucasian male appears to be in NAD, Well developed, Well nourished, alert and cooperative Head:  Normocephalic and atraumatic. Eyes:   PEERL, EOMI. No icterus. Conjunctiva pink. Ears:  Normal auditory acuity. Neck:  Supple Throat: Oral cavity and pharynx  without inflammation, swelling or lesion.  Lungs: Respirations even and unlabored. Lungs clear to auscultation bilaterally.   No wheezes, crackles, or rhonchi.  Heart: Normal S1, S2. No MRG. Regular rate and rhythm. No peripheral edema, cyanosis or pallor.  Abdomen:  Soft, nondistended, nontender. No rebound or guarding. Normal bowel sounds. No appreciable masses or hepatomegaly. Rectal:  External: maroon/brb mixture found around the rectum, not sure if there is slow oozing or if this is residue from previous Msk:  Symmetrical without gross deformities. Peripheral pulses intact.  Extremities:  Without edema, no deformity or joint abnormality.  Neurologic:  Alert and  oriented x4;  grossly normal neurologically.  Skin:   Dry and intact without significant lesions or rashes. Psychiatric: Demonstrates good judgement and reason without abnormal affect or behaviors.   LAB RESULTS: Recent Labs    09/28/20 0800 09/28/20 2138 09/29/20 0705  WBC 14.1* 17.8* 19.4*  HGB 13.2 11.3* 9.7*  HCT 38.6* 34.2* 28.7*  PLT 287 265 245   BMET Recent Labs    09/28/20 0800 09/29/20 0646  NA 131* 126*  K 5.5* 5.9*  CL 95* 92*  CO2 29 23  GLUCOSE 115* 116*  BUN 48* 80*  CREATININE 1.68* 2.34*  CALCIUM 8.6* 8.1*     Impression / Plan:   Impression: 1.  Acute blood loss anemia from GI bleed: Hemoglobin 11.3--> 9.7 overnight with 3 active bowel movements with bright red blood, some found this morning around rectum, no previous GI bleeding history, no abdominal or  rectal pain, no prior colonoscopy; Consider diverticular vs other 2.  New diagnosis of non-small cell lung cancer: with brain mets and a pathologic fracture at T7 with possible cord compression 3.   AKI CKD stage III 4.  Cancer related pain/nausea  Plan: 1.  Ordered stat tagged RBC scan to try and identify source of bleeding 2.  Continue to monitor hemoglobin with transfusion as needed less than 7 3.  Patient to remain n.p.o. for now 4.  Please await any further recommendations from Dr. Tarri Glenn later today  Thank you for your kind consultation, we will continue to follow.  Lavone Nian [facility]  09/29/2020, 8:35 AM

## 2020-09-29 NOTE — Progress Notes (Signed)
Rec'd a call from patient's nurse that he would not start his bowel preparation because he did not feel anyone had explained to him the nature of his endoscopic procedures.   The initial plan in the consult note was for EGD/colon if bleeding scan negative.  That report came back late afternoon with no active bleeding seen.  I was unable to find documentation of the GI consult team speaking with the patient after the bleeding scan results.  After reviewing Dr. Payton Emerald consult note from earlier today and the bleeding scan report, I spoke with the patient on the nurse's phone, with the nurse in the room. He did not recall the plan for possible endoscopic procedures.  I explained what we know so far about his GI bleeding and the reason for the EGD and colonoscopy.  I explained the need for bowel prep, how the procedures are performed with sedation and that it would be done by one of our consult team physicians tomorrow. He asked what we might find or if more tests were needed, and was upset when I could only give him possibilities rather than guarantees, stating "so you really don't know what's going on".  When he asked if the bleeding could have occurred from lack of food and I replied no, he became argumentative and said " really, never, so that's impossible?"  In general, he was unsatisfied with any explanations or answers I gave him.  I offered him the choice to take the bowel prep and have the procedures tomorrow or not to do so, and that the consult team would see him in the morning either way to address his questions and concerns.  He became more agitated, again expressed frustration about his hospital stay, lack of food and that nobody had explained this to him earlier today.  He wanted me to call his daughter to explain all of this, which I was not able to do with other urgent calls from the ED awaiting my answer. He then sounded as if he was going to hand the phone back to the nurse and we were  disconnected.    A call back to the nurse was not answered.  I will message our daytime consult team with an update that they will receive in the AM.

## 2020-09-29 NOTE — Progress Notes (Signed)
Chaplain responded to page for patient education of the AD.  Met patient and daughter on their way to nuclear medicine.  Daughter and chaplain visited.  Chaplain offered ministry of presence to daughter who is grieving her father's illness. Daughter still grieving over her mother's death in 01-12-12.   Daughter expressed how lonely she is and how burdened she feels with all the things she is doing for her dad which is exacerbated by her younger sister refusing to help.  Daughter is here from [name] where she has taken family leave of absence to be here with her dad. Daughter expressed concern over how she will be able to afford to get her father back to [name] for burial should he die here...which she expects will happen. Daughter expressed wanting to be prepared rather than have to make snap decisions immediately following her father's death.  Chaplain promised Spiritual Care will find resources she can call so she can start making plans.   When daughter expressed she hasn't eaten a full meal in a week, Chaplain gave her a tour of hospital to [place] and to cafeteria.  Chaplain will attempt to get housing arranged for daughter so she can leave the expensive hotel.   Chaplain spoke with patient about the AD then left him and his daughter alone to discuss if he wanted to proceed with notary of document.Daughter reported father did not want to discuss at this time.    Chaplain will pass care of this family to day Chaplains for continuity of spiritual care.  This Chaplain stands ready to respond again at any time throughout the remainder of this day and night as needed.  [place]elle

## 2020-09-29 NOTE — Progress Notes (Signed)
[facility]  09/29/20 1:28  I received a called from nuclear medicine that patient could not be transferred a she was unstable.  He has had another episode of hematochezia and Dr. Erlinda Hong is trying to get his blood pressure back up with fluid bolus and 1 u prbcs.  When speaking with the nurse manager on the floor she had initially declined transfer to nuclear medicine because she did not have a nurse to accompany the patient.  I spoke with nurse manager for the floor and she is going to accompany the patient herself so he can get Bleeding scan as this is urgent and best done in the setting of acute GI Bleed. If they find a source then IR can intervene accordingly.  I did pass by and see the patient again, he does have some active slow bleeding from his rectum and is aware of plan for bleeding scan.  Patient will be transferred to nuclear medicine in the next 15-20 min.  Also updated Dr. Tarri Glenn.  Ellouise Newer, PA-C

## 2020-09-29 NOTE — Progress Notes (Addendum)
PROGRESS NOTE    Tyler Barker  SNK:[id number] DOB: February 23, 1961 DOA: 09/21/2020 PCP: Patient, No Pcp Per    Chief Complaint  Patient presents with  . 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  Imaging in the emergency department revealed widely metastatic disease.  Underwent lymph node biopsy which revealed metastatic non-small cell lung cancer.  Seen by oncology, radiation oncology, palliative medicine, interventional radiology and neurosurgery.  Current recommendation is radiation therapy followed by kyphoplasty.  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 [facility]) Active Problems:   Metastatic disease ([place])   Renal insufficiency   Current smoker   Acute ischemic stroke ([place])   Lung mass   Liver lesion   Metastasis to brain [facility])   Palliative care by specialist   Cancer associated pain   DNR (do not resuscitate) discussion   Acute blood loss anemia fro GI bleed  -sudden onset of bloody bm x3 11/13-11/14  night, denies ab pain, blood pressure dropped, hgb dropped from 11.3 yesterday to 9.7 this morning, hold asa/lovenox, continue fluids bolus and ppi, keep npo, check hgb q6hrs -he did have constipation for 10days, per nurse tech stool is "red brown",   Patient denies ab pain, I wonder if diverticular bleed -GI consult requested, case discussed with Dr Tarri Glenn , will follow recommendation  Addendum:  has another BM with pure blood per RN, sbp in the 80's after several fluids bolus, he is slightly confused, stat labs, transfuse, bleeding scan Transfer to stepdown/icu, case discussed with GI Discussed with daughter at bedside  New diagnosis of 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 and pathologic fracture at T7 with possible cord compression,  -His transfer from [facility] to [facility] for urgent radiation treatment due to patient is at risk of paralysis, plan for radiation therapy followed by kyphoplasty at T7 -he is also getting XRT to right chest mass with impending SVC syndrome, sacrum, and whole brain -Continue steroid -Appreciate oncology, radiation oncology, neurosurgery, palliative care input  Acute urinary retention Foley catheter placed   Few scattered subcentimeter acute ischemic nonhemorrhagic infarct -MRI imaging personally reviewed -Case discussed with neurology, recommend asa 81mg , already had echocardiogram   Leukocytosis likely steroid induced  hyperkalemia lokelma x1 on 11/13,  k remain elevated at 5.9 , likely due to dehydration and aki, fluids bolus, repeat bmp at 2pm  Hyponatremia: Likely from dehydration, fluids bolus then continoeus infusion  AKI on CKD 3 a,  Likely combination of dehydration and urinary retention CT abdomen pelvis no obstructive nephropathy on presentation Ivf, foley catheter   Cancer related pain/nausea -Currently on Zofran as needed, Ativan if nausea not controlled by Zofran, start low dose  zyprexa qhs -Continue as needed analgesic -increased  MS Contin, consider Neurontin, but will adjust medication gradually   Constipation No BM for a week, increased stool regimen, had bm on 11/13-14 night  Very difficult situation with new diagnosis of metastatic  disease, patient from [name] with no permanent home, 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  Truck currently undergoing repairs here in [place].  2 daughters very  supportive Appreciated case management assistant   DVT prophylaxis: hold lovenox in the setting of gi bleed, start scd's   Code Status: Full Family Communication: Daughter Estill Bamberg at bedside daily Disposition:   Status is: Inpatient  Dispo: The patient is from: Home              Anticipated d/c is to: To be determined              Anticipated d/c date is: To be determined                Consultants:   Medical oncology  Radiation oncology  Neurosurgery  Neurology  Palliative care   Pulmonology  Interventional radiology  GI  Procedures:  ULTRASOUND-GUIDED RIGHT SUPRACLAVICULAR LYMPH NODE BIOPSY on 11/8  Antimicrobials:   None     Objective: Vitals:   09/29/20 0322 09/29/20 0608 09/29/20 0700 09/29/20 0729  BP: (!) 89/58 (!) 75/66  (!) 91/55  Pulse: 99 (!) 104  84  Resp: 18 16  18   Temp: 97.8 F (36.6 C)   97.7 F (36.5 C)  TempSrc: Oral   Oral  SpO2: 97%   98%  Weight:   85.8 kg   Height:        Intake/Output Summary (Last 24 hours) at 09/29/2020 0748 Last data filed at 09/29/2020 0203 Gross per 24 hour  Intake --  Output 1300 ml  Net -1300 ml   Filed Weights   09/21/20 1545 09/28/20 0632 09/29/20 0700  Weight: 90.7 kg 83.7 kg 85.8 kg    Examination:  General exam: lethargic, slightly confused Respiratory system: Overall diminished breath sound, no wheezing, no rhonchi, no rales. Respiratory effort normal. Cardiovascular system: S1 & S2 heard, RRR. No pedal edema. Gastrointestinal system: Abdomen is nondistended, soft and nontender. Normal bowel sounds heard. Central nervous system: lethargic, slightly confused Extremities: Not able to lift bilateral lower extremity against gravity, right foot drop Skin: No rashes, lesions or ulcers Psychiatry: lethargic    Data Reviewed: I have personally reviewed following labs and imaging studies  CBC: Recent Labs  Lab 09/24/20 0339 09/25/20 0345 09/28/20 0800 09/28/20 2138 09/29/20 0705   WBC 15.9* 15.4* 14.1* 17.8* 19.4*  NEUTROABS  --   --  11.6* 14.9*  --   HGB 13.0 12.1* 13.2 11.3* 9.7*  HCT 38.3* 35.7* 38.6* 34.2* 28.7*  MCV 97.0 96.7 96.3 97.7 98.0  PLT 271 270 287 265 409    Basic Metabolic Panel: Recent Labs  Lab 09/23/20 0405 09/24/20 0339 09/25/20 0345 09/28/20 0800 09/29/20 0646  NA 134* 135 134* 131* 126*  K 4.0 4.7 4.5 5.5* 5.9*  CL 102 102 102 95* 92*  CO2 22 23 24 29 23   GLUCOSE 131* 129* 138* 115* 116*  BUN 20 27* 31* 48* 80*  CREATININE 1.87* 1.63* 1.64* 1.68* 2.34*  CALCIUM 9.0 9.0 8.6* 8.6* 8.1*  MG  --  2.3 2.0  --   --   PHOS  --  4.0 3.6  --   --     GFR: Estimated Creatinine Clearance: 36 mL/min (A) (by C-G formula based on SCr of 2.34 mg/dL (H)).  Liver Function Tests: Recent Labs  Lab 09/24/20 0339  AST 30  ALT 23  ALKPHOS 83  BILITOT 0.7  PROT 6.3*  ALBUMIN 3.2*    CBG:  No results for input(s): GLUCAP in the last 168 hours.   Recent Results (from the past 240 hour(s))  Respiratory Panel by RT PCR (Flu A&B, Covid) - Nasopharyngeal Swab     Status: None   Collection Time: 09/22/20  2:38 AM   Specimen: Nasopharyngeal Swab  Result Value Ref Range Status   SARS Coronavirus 2 by RT PCR NEGATIVE NEGATIVE Final    Comment: (NOTE) SARS-CoV-2 target nucleic acids are NOT DETECTED.  The SARS-CoV-2 RNA is generally detectable in upper respiratoy specimens during the acute phase of infection. The lowest concentration of SARS-CoV-2 viral copies this assay can detect is 131 copies/mL. A negative result does not preclude SARS-Cov-2 infection and should not be used as the sole basis for treatment or other patient management decisions. A negative result may occur with  improper specimen collection/handling, submission of specimen other than nasopharyngeal swab, presence of viral mutation(s) within the areas targeted by this assay, and inadequate number of viral copies (<131 copies/mL). A negative result must be combined with  clinical observations, patient history, and epidemiological information. The expected result is Negative.  Fact Sheet for Patients:  PinkCheek.be  Fact Sheet for Healthcare Providers:  GravelBags.it  This test is no t yet approved or cleared by the [place] FDA and  has been authorized for detection and/or diagnosis of SARS-CoV-2 by FDA under an Emergency Use Authorization (EUA). This EUA will remain  in effect (meaning this test can be used) for the duration of the COVID-19 declaration under Section 564(b)(1) of the Act, 21 U.S.C. section 360bbb-3(b)(1), unless the authorization is terminated or revoked sooner.     Influenza A by PCR NEGATIVE NEGATIVE Final   Influenza B by PCR NEGATIVE NEGATIVE Final    Comment: (NOTE) The Xpert Xpress SARS-CoV-2/FLU/RSV assay is intended as an aid in  the diagnosis of influenza from Nasopharyngeal swab specimens and  should not be used as a sole basis for treatment. Nasal washings and  aspirates are unacceptable for Xpert Xpress SARS-CoV-2/FLU/RSV  testing.  Fact Sheet for Patients: PinkCheek.be  Fact Sheet for Healthcare Providers: GravelBags.it  This test is not yet approved or cleared by the [place] FDA and  has been authorized for detection and/or diagnosis of SARS-CoV-2 by  FDA under an Emergency Use Authorization (EUA). This EUA will remain  in effect (meaning this test can be used) for the duration of the  Covid-19 declaration under Section 564(b)(1) of the Act, 21  U.S.C. section 360bbb-3(b)(1), unless the authorization is  terminated or revoked. Performed at [facility], [place] [address] [place], [place] [address]          Radiology Studies: No results found.      Scheduled Meds: . sodium chloride   Intravenous Once  . dexamethasone (DECADRON) injection  4 mg Intravenous Q12H  .  guaiFENesin  600 mg Oral BID  . OLANZapine  2.5 mg Oral QHS  . oxyCODONE  15 mg Oral Q12H  . [START ON 10/02/2020] pantoprazole  40 mg Intravenous Q12H  . polyethylene glycol  17 g Oral BID  . senna-docusate  1 tablet Oral BID  . tamsulosin  0.4 mg Oral Daily   Continuous Infusions: . sodium chloride    . pantoprozole (PROTONIX) infusion    . pantoprazole (PROTONIX) 80 mg IVPB    . sodium chloride       LOS: 7 days   Time spent: 19mins Greater than 50% of this time was spent in counseling, explanation  of diagnosis, planning of further management, and coordination of care.  I have personally reviewed and interpreted on  09/29/2020 daily labs, I reviewed all nursing notes, pharmacy notes, consultant notes,  vitals, pertinent old records  I have discussed plan of care as described above with RN , patient and family on 09/29/2020  Voice Recognition /Dragon dictation system was used to create this note, attempts have been made to correct errors. Please contact the author with questions and/or clarifications.   Florencia Reasons, MD PhD FACP [facility]  Available via Epic secure chat 7am-7pm for nonurgent issues Please page for urgent issues To page the attending provider between 7A-7P or the covering provider during after hours 7P-7A, please log into the web site [email or web address] and access using universal [place] password for that web site. If you do not have the password, please call the hospital operator.    09/29/2020, 7:48 AM

## 2020-09-30 ENCOUNTER — Encounter (HOSPITAL_COMMUNITY)
Admission: EM | Disposition: A | Discharge status: Home / Self Care | Payer: Self-pay | Source: Home / Self Care | Attending: Internal Medicine

## 2020-09-30 ENCOUNTER — Encounter (HOSPITAL_COMMUNITY): Payer: Self-pay | Admitting: Certified Registered"

## 2020-09-30 ENCOUNTER — Ambulatory Visit
Admit: 2020-09-30 | Discharge: 2020-09-30 | Disposition: A | Discharge status: Home / Self Care | Payer: Self-pay | Attending: Radiation Oncology | Admitting: Radiation Oncology

## 2020-09-30 LAB — BPAM RBC
Blood Product Expiration Date: 202112052359
ISSUE DATE / TIME: 202111141730
Unit Type and Rh: 6200

## 2020-09-30 LAB — HEMOGLOBIN AND HEMATOCRIT, BLOOD
HCT: 24.4 % — ABNORMAL LOW (ref 39.0–52.0)
HCT: 26.8 % — ABNORMAL LOW (ref 39.0–52.0)
Hemoglobin: 8.5 g/dL — ABNORMAL LOW (ref 13.0–17.0)
Hemoglobin: 9.2 g/dL — ABNORMAL LOW (ref 13.0–17.0)

## 2020-09-30 LAB — BASIC METABOLIC PANEL
Anion gap: 7 (ref 5–15)
BUN: 59 mg/dL — ABNORMAL HIGH (ref 6–20)
CO2: 17 mmol/L — ABNORMAL LOW (ref 22–32)
Calcium: 6.8 mg/dL — ABNORMAL LOW (ref 8.9–10.3)
Chloride: 106 mmol/L (ref 98–111)
Creatinine, Ser: 1.51 mg/dL — ABNORMAL HIGH (ref 0.61–1.24)
GFR, Estimated: 54 mL/min — ABNORMAL LOW (ref >60)
Glucose, Bld: 112 mg/dL — ABNORMAL HIGH (ref 70–99)
Potassium: 5.4 mmol/L — ABNORMAL HIGH (ref 3.5–5.1)
Sodium: 130 mmol/L — ABNORMAL LOW (ref 135–145)

## 2020-09-30 LAB — TYPE AND SCREEN
ABO/RH(D): A POS
Antibody Screen: NEGATIVE
Unit division: 0

## 2020-09-30 LAB — CBC WITH DIFFERENTIAL/PLATELET
Abs Immature Granulocytes: 0.15 10*3/uL — ABNORMAL HIGH (ref 0.00–0.07)
Basophils Absolute: 0 10*3/uL (ref 0.0–0.1)
Basophils Relative: 0 %
Eosinophils Absolute: 0 10*3/uL (ref 0.0–0.5)
Eosinophils Relative: 0 %
HCT: 22.9 % — ABNORMAL LOW (ref 39.0–52.0)
Hemoglobin: 8 g/dL — ABNORMAL LOW (ref 13.0–17.0)
Immature Granulocytes: 1 %
Lymphocytes Relative: 6 %
Lymphs Abs: 0.8 10*3/uL (ref 0.7–4.0)
MCH: 32.7 pg (ref 26.0–34.0)
MCHC: 34.9 g/dL (ref 30.0–36.0)
MCV: 93.5 fL (ref 80.0–100.0)
Monocytes Absolute: 1 10*3/uL (ref 0.1–1.0)
Monocytes Relative: 7 %
Neutro Abs: 11.7 10*3/uL — ABNORMAL HIGH (ref 1.7–7.7)
Neutrophils Relative %: 86 %
RBC: 2.45 MIL/uL — ABNORMAL LOW (ref 4.22–5.81)
RDW: 14.4 % (ref 11.5–15.5)
WBC: 13.6 10*3/uL — ABNORMAL HIGH (ref 4.0–10.5)
nRBC: 0 % (ref 0.0–0.2)

## 2020-09-30 SURGERY — CANCELLED PROCEDURE

## 2020-09-30 MED ORDER — CALCIUM GLUCONATE-NACL 2-0.675 GM/100ML-% IV SOLN
2.0000 g | Freq: Once | INTRAVENOUS | Status: AC
  Administered 2020-09-30: 2000 mg via INTRAVENOUS
  Filled 2020-09-30: qty 100

## 2020-09-30 MED ORDER — SODIUM ZIRCONIUM CYCLOSILICATE 10 G PO PACK
10.0000 g | PACK | Freq: Once | ORAL | Status: AC
  Administered 2020-09-30: 10 g via ORAL
  Filled 2020-09-30: qty 1

## 2020-09-30 SURGICAL SUPPLY — 21 items

## 2020-09-30 NOTE — Progress Notes (Addendum)
Patient ID: Tyler Barker, male   DOB: [birth year], 59 y.o.   MRN: [id number]  I met with the patient and his daughter Estill Bamberg along with [facility] palliative care NP. I discussed the recommendation for a colonoscopy due to his rectal bleeding. I discussed the colonoscopy procedure in full detail and discussed the risks and benefits including risk with sedation, risk of bleeding, perforation and infection. The patient wishes to see if he has any further bleeding over the next 24 hours, if not, he is reluctant to purse endoscopic evaluation. Our service will check back with him tomorrow.   GI ATTENDING  Addendum Per nurse practitioner noted.  Docia Chuck. Geri Seminole., M.D. [facility]ology

## 2020-09-30 NOTE — Progress Notes (Addendum)
[facility] Progress Note  CC:  Hematochezia, anemia   Subjective: He complains of having nausea. He complains of generalized abdominal pain. He is tolerating a clear liquid diet this am. He refused colonoscopy bowel prep. He is remains frustrated, feels like no one is telling him what is going on. See phone note last night with Dr. Loletha Carrow.   He asked that I return when his daughter Estill Bamberg arrives to discuss his recommended endoscopic evaluation.  His RN reported no bloody BMs over night. He passed a moderate amount of darker red blood and bright red blood with loose stool earlier this am.   Objective:   Tagged RBC scan 09/29/2020: No evidence of active GI bleeding.  Vital signs in last 24 hours: Temp:  [97.8 F (36.6 C)-98 F (36.7 C)] 98 F (36.7 C) (11/15 0323) Pulse Rate:  [53-119] 79 (11/15 0700) Resp:  [10-25] 10 (11/15 0700) BP: (82-114)/(41-62) 114/41 (11/15 0700) SpO2:  [95 %-100 %] 99 % (11/15 0700) Last BM Date: 09/29/20 General:   Drowsy but easily arousable.  Eyes: No scleral icterus.  Mouth: Poor dentition.  Heart: RRR, no murmur.  Pulm: Clear, diminished in the bases.  Abdomen: Soft, nondistended. Mild right mid abdominal tenderness without rebound or guarding.+ BS x 4 quads. No HSM. Extremities:  LEs with trace edema.  Neurologic:  Alert and  oriented x4.  Speech is clear. Moves all extremities.  Psych:  Alert, fatigued appearing. Cooperative at this time.   Intake/Output from previous day: 11/14 0701 - 11/15 0700 In: 2947.7 [I.V.:1585; Blood:353.8; IV Piggyback:1009] Out: 2910 [Urine:2910] Intake/Output this shift: No intake/output data recorded.  Lab Results: Recent Labs    09/28/20 0800 09/28/20 0800 09/28/20 2138 09/28/20 2138 09/29/20 0705 09/29/20 0705 09/29/20 1243 09/30/20 0100 09/30/20 0625  WBC 14.1*   < > 17.8*  --  19.4*  --   --  13.6*  --   HGB 13.2   < > 11.3*   < > 9.7*   < > 8.7* 8.0* 8.5*  HCT 38.6*   < >  34.2*   < > 28.7*   < > 24.6* 22.9* 24.4*  PLT 287  --  265  --  245  --   --   --   --    < > = values in this interval not displayed.   BMET Recent Labs    09/29/20 0646 09/29/20 1243 09/30/20 0100  NA 126* 124* 130*  K 5.9* 5.1 5.4*  CL 92* 97* 106  CO2 23 21* 17*  GLUCOSE 116* 122* 112*  BUN 80* 77* 59*  CREATININE 2.34* 2.04* 1.51*  CALCIUM 8.1* 7.2* 6.8*   LFT No results for input(s): PROT, ALBUMIN, AST, ALT, ALKPHOS, BILITOT, BILIDIR, IBILI in the last 72 hours. PT/INR Recent Labs    09/29/20 1315  LABPROT 13.6  INR 1.1   Hepatitis Panel No results for input(s): HEPBSAG, HCVAB, HEPAIGM, HEPBIGM in the last 72 hours.  NM GI Blood Loss  Result Date: 09/29/2020 CLINICAL DATA:  Lower GI bleeding.  Decreased hemoglobin EXAM: NUCLEAR MEDICINE GASTROINTESTINAL BLEEDING SCAN TECHNIQUE: Sequential abdominal images were obtained following intravenous administration of Tc-30m labeled red blood cells. Imaging was performed for 2 hours. RADIOPHARMACEUTICALS:  22.6 mCi Tc-60m pertechnetate in-vitro labeled red cells. COMPARISON:  None. FINDINGS: Normal blood pool activity is seen. No radiotracer accumulation in small or large bowel seen. IMPRESSION: No evidence of active GI bleeding. Electronically Signed   By: Myles Rosenthal.D.  On: 09/29/2020 16:44    Assessment / Plan:  90. 59 year old male with hematochezia and anemia. Hg 11.3 -> 9.7 -> 8.7. Transfused 1 unit or PRBCs 11/14 -> Hg 8.0 -> 8.5.  Tagged red blood cell scan 11/14 was negative.  He passed a moderate amount of dark red blood and bright red blood per the rectum this morning.  He remains hemodynamically stable. -Monitor H&H closely -Transfuse for Hg < 7 -Monitor bloody bowel movements  -Patient refused colonoscopy +/- EGD patient, to discuss endoscopic recommendations when daughter present this afternoon -Clear liquid diet -? PPI infusion for now or change to IV BID  2. New diagnosis of non-small cell lung cancer  with brain mets and a pathologic fracture at T7 with possible cord compression -Palliative care meeting with daughter scheduled today at  2pm -Oncology following closely, palliative radiation planned   3.   AKI on  CKD stage III. Cr 2.0 -> 1.51  4.  Cancer related pain/nausea -Pain management per CCM -Zofran 4mg   IV Q 6hrs PRN  5.  Hyponatremia and Hyperkalemia -Management per CCM  Further recommendations per Dr. 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)   Lung mass   Liver lesion   Metastasis to brain [facility])   Palliative care by specialist   Cancer associated pain   DNR (do not resuscitate) discussion   Anemia due to GI blood loss   Hematochezia     LOS: 8 days   Tyler Barker  09/30/2020, 8:45 AM  GI ATTENDING  Interval history and data reviewed.  Agree with interval progress note as outlined above.  Complicated case.  Seems to have had significant lower GI bleeding which has ceased (for now).  He is contemplating colonoscopy.  Overall prognosis poor.  Primary service to manage symptomatic issues and correct electrolyte abnormalities.  We will follow back up tomorrow.  Thanks  Docia Chuck. Geri Seminole., M.D. [facility] Division of Gastroenterology

## 2020-09-30 NOTE — Progress Notes (Signed)
Patient ID: Tyler Barker, male   DOB: 05-29-1961, 59 y.o.   MRN: [id number]  This NP visited patient at the bedside as a follow up to last week's  GOCs meeting, re-addressing current medical treatment plan; diagnosis, prognosis, treatment , AD decisions and anticipatory care needs.  Daughter/Amanda at bedside.  Created space and opportunity for patient and his daughter to explore the thoughts and feelings regarding patient's current medical situation.  Patient continues to verbalize his frustration with overall situation and plan of care.  Frustrated over new situation of GI bleed and facing decisions regarding work-up. Patient verbalizes that he does not want to move forward with colonoscopy unless he has further bleeding.    Education offered on the difference between and aggressive medical intervention path and a palliative comfort path for this patient at this time in this situation.    Plan of care: -Full code - continue with current treatment plan, he hopes to continue with radiation treatments as planned -evaluate in the morning and have further discussion with GI -daughter continues to work on application for insurance benefits -one day at a time   PMT will continue to support holistically   Discussed with patient the importance of continued conversation with his daughter and the  medical providers regarding overall plan of care and treatment options,  ensuring decisions are within the context of the patients values and GOCs.  Questions and concerns addressed   Discussed with Dr Tawanna Solo  Total time spent on the unit was 35 minutes  Greater than 50% of the time was spent in counseling and coordination of care  [name] NP  Palliative Medicine Team Team Phone # [phone] Pager [phone]

## 2020-09-30 NOTE — Progress Notes (Signed)
PROGRESS NOTE    Draylon Mercadel  ZCH:[id number] DOB: Feb 17, 1961 DOA: 09/21/2020 PCP: Patient, No Pcp Per   Chief Complain: Gait problem  Brief Narrative: Patient is a 59 year old man who presented to the emergency department for the evaluation of back pain/lower extremity numbness, difficulty ambulation.  Imagings done in the emergency department showed widespread metastatic disease from lung cancer.  He underwent lymph node biopsy which revealed metastatic none small cell lung cancer.  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 He was seen by oncology, radiation oncology, palliative medicine, IR and neurosurgery during this hospitalization.  Hospital course remarkable for new onset GI bleed 11/13, hypertension, AKI.  GI consulted and the plan is  for colonoscopy/EGD.  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  Denies any abdominal pain.  His hemoglobin dropped, blood pressure dropped requiring IV fluids, PPI.  GI consulted and following.  Plan for EGD/colonoscopy after discussion with family, patient is currently refusing..  Continue PPI, currently on clear liquid diet. He was also reporting constipation for more than a week.  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  Oncology, radiation oncology, neurosurgery were following during this hospitalization.  Plan is for palliative radiation therapy  followed by kyphoplasty at T7.  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  Continue steroid.  Palliative care also following  Acute urinary retention: Most likely secondary to brain/spinal mets.  Foley catheter placed.  Leukocytosis: Thought to be from steroids.  Continues to improve.  Few scattered subcentimeter acute ischemic nonhemorrhagic infarct: As per MRI.  Case discussed with neurology and recommended to start on aspirin 81 mg.  Aspirin has been stopped due to GI bleed.  Hyperkalemia: Continue monitor, treated with Lokelma.  AKI on CKD stage III: Secondary to dehydration/urinary retention.  CT abdomen/pelvis did not show any obstructive nephropathy.  Continue to monitor kidney function.  Cancer-related pain/nausea: Continue supportive care, pain medications, antiemetics.  Also started on Zyprexa for refractory nausea.  Goals of care: Young patient with newly diagnosed metastatic lung cancer.  Very poor prognosis.  Poor quality of life.  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Palliative  care also following and planning for meeting today at 2 PM.  Remains full code           DVT prophylaxis:SCD Code Status: Full Family Communication: None at bedside Status is: Inpatient  Remains inpatient appropriate because:Inpatient level of care appropriate due to severity of illness   Dispo: The patient is from: Home              Anticipated d/c is to: Home              Anticipated d/c date is: 3 days  Patient currently is not medically stable to d/c.    Consultants: GI, oncology, radiation oncology, palliative care, neurosurgery  Procedures: Biopsy  Antimicrobials:  Anti-infectives (From admission, onward)   None      Subjective: Patient seen and examined at the bedside this morning.  Hemodynamically stable during my evaluation.  Comfortable, lying on the  bed.  Expresses disappointment about so many things and mainly being at the hospital and not moving forward.  He says he has severe back pain.  Denies any bloody bowel movement this morning.  Objective: Vitals:   09/30/20 0400 09/30/20 0500 09/30/20 0700 09/30/20 0800  BP: (!) 93/46 (!) 92/50 (!) 114/41   Pulse: 73 64 79   Resp: '18 13 10   ' Temp:    98.1 F (36.7 C)  TempSrc:    Oral  SpO2: 95% 95% 99%   Weight:      Height:        Intake/Output Summary (Last 24 hours) at 09/30/2020 0944 Last data filed at 09/30/2020 0500 Gross per 24 hour  Intake 2947.73 ml  Output 2450 ml  Net 497.73 ml   Filed Weights   09/21/20 1545 09/28/20 0632 09/29/20 0700  Weight: 90.7 kg 83.7 kg 85.8 kg    Examination:  General exam: Not in obvious distress,average built HEENT:PERRL,Oral mucosa moist, Ear/Nose normal on gross exam Respiratory system: Bilateral equal air entry, normal vesicular breath sounds, no wheezes or crackles  Cardiovascular system: S1 & S2 heard, RRR. No JVD, murmurs, rubs, gallops or clicks. No pedal edema. Gastrointestinal system: Abdomen is nondistended, soft and nontender. No organomegaly or masses felt. Normal bowel sounds heard. Central nervous system: Alert and oriented. No focal neurological deficits. Extremities: No edema, no clubbing ,no cyanosis Skin: No rashes, lesions or ulcers,no icterus ,no pallor GU: Foley  Data Reviewed: I have personally reviewed following labs and imaging studies  CBC: Recent Labs  Lab 09/24/20 0339 09/24/20 0339 09/25/20 0345 09/25/20 0345 09/28/20 0800 09/28/20 0800 09/28/20 2138 09/29/20 0705 09/29/20 1243 09/30/20 0100 09/30/20 0625  WBC 15.9*   < > 15.4*  --  14.1*  --  17.8* 19.4*  --  13.6*  --   NEUTROABS  --   --   --   --  11.6*  --  14.9*  --   --  11.7*  --   HGB 13.0   < > 12.1*   < > 13.2   < > 11.3* 9.7* 8.7* 8.0* 8.5*  HCT 38.3*   < > 35.7*   < > 38.6*   < > 34.2* 28.7* 24.6* 22.9* 24.4*  MCV 97.0   < > 96.7   --  96.3  --  97.7 98.0  --  93.5  --   PLT 271  --  270  --  287  --  265 245  --   --   --    < > = values in this interval not displayed.   Basic Metabolic Panel: Recent Labs  Lab 09/24/20 0339 09/24/20 0339 09/25/20 0345 09/28/20 0800 09/29/20 0646 09/29/20 1243 09/30/20 0100  NA 135   < > 134* 131* 126* 124* 130*  K 4.7   < > 4.5 5.5* 5.9* 5.1 5.4*  CL 102   < > 102 95* 92* 97* 106  CO2 23   < > '24 29 23 ' 21* 17*  GLUCOSE 129*   < > 138* 115* 116* 122* 112*  BUN 27*   < > 31* 48*  80* 77* 59*  CREATININE 1.63*   < > 1.64* 1.68* 2.34* 2.04* 1.51*  CALCIUM 9.0   < > 8.6* 8.6* 8.1* 7.2* 6.8*  MG 2.3  --  2.0  --   --   --   --   PHOS 4.0  --  3.6  --   --   --   --    < > = values in this interval not displayed.   GFR: Estimated Creatinine Clearance: 55.7 mL/min (A) (by C-G formula based on SCr of 1.51 mg/dL (H)). Liver Function Tests: Recent Labs  Lab 09/24/20 0339  AST 30  ALT 23  ALKPHOS 83  BILITOT 0.7  PROT 6.3*  ALBUMIN 3.2*   No results for input(s): LIPASE, AMYLASE in the last 168 hours. No results for input(s): AMMONIA in the last 168 hours. Coagulation Profile: Recent Labs  Lab 09/29/20 1315  INR 1.1   Cardiac Enzymes: No results for input(s): CKTOTAL, CKMB, CKMBINDEX, TROPONINI in the last 168 hours. BNP (last 3 results) No results for input(s): PROBNP in the last 8760 hours. HbA1C: No results for input(s): HGBA1C in the last 72 hours. CBG: Recent Labs  Lab 09/29/20 2127  GLUCAP 109*   Lipid Profile: No results for input(s): CHOL, HDL, LDLCALC, TRIG, CHOLHDL, LDLDIRECT in the last 72 hours. Thyroid Function Tests: No results for input(s): TSH, T4TOTAL, FREET4, T3FREE, THYROIDAB in the last 72 hours. Anemia Panel: No results for input(s): VITAMINB12, FOLATE, FERRITIN, TIBC, IRON, RETICCTPCT in the last 72 hours. Sepsis Labs: No results for input(s): PROCALCITON, LATICACIDVEN in the last 168 hours.  Recent Results (from the past 240  hour(s))  Respiratory Panel by RT PCR (Flu A&B, Covid) - Nasopharyngeal Swab     Status: None   Collection Time: 09/22/20  2:38 AM   Specimen: Nasopharyngeal Swab  Result Value Ref Range Status   SARS Coronavirus 2 by RT PCR NEGATIVE NEGATIVE Final    Comment: (NOTE) SARS-CoV-2 target nucleic acids are NOT DETECTED.  The SARS-CoV-2 RNA is generally detectable in upper respiratoy specimens during the acute phase of infection. The lowest concentration of SARS-CoV-2 viral copies this assay can detect is 131 copies/mL. A negative result does not preclude SARS-Cov-2 infection and should not be used as the sole basis for treatment or other patient management decisions. A negative result may occur with  improper specimen collection/handling, submission of specimen other than nasopharyngeal swab, presence of viral mutation(s) within the areas targeted by this assay, and inadequate number of viral copies (<131 copies/mL). A negative result must be combined with clinical observations, patient history, and epidemiological information. The expected result is Negative.  Fact Sheet for Patients:  PinkCheek.be  Fact Sheet for Healthcare Providers:  GravelBags.it  This test is no t yet approved or cleared by the [place] FDA and  has been authorized for detection and/or diagnosis of SARS-CoV-2 by FDA under an Emergency Use Authorization (EUA). This EUA will remain  in effect (meaning this test can be used) for the duration of the COVID-19 declaration under Section 564(b)(1) of the Act, 21 U.S.C. section 360bbb-3(b)(1), unless the authorization is terminated or revoked sooner.     Influenza A by PCR NEGATIVE NEGATIVE Final   Influenza B by PCR NEGATIVE NEGATIVE Final    Comment: (NOTE) The Xpert Xpress SARS-CoV-2/FLU/RSV assay is intended as an aid in  the diagnosis of influenza from Nasopharyngeal swab specimens and  should not  be used as a sole basis for treatment. Nasal washings  and  aspirates are unacceptable for Xpert Xpress SARS-CoV-2/FLU/RSV  testing.  Fact Sheet for Patients: PinkCheek.be  Fact Sheet for Healthcare Providers: GravelBags.it  This test is not yet approved or cleared by the [place] FDA and  has been authorized for detection and/or diagnosis of SARS-CoV-2 by  FDA under an Emergency Use Authorization (EUA). This EUA will remain  in effect (meaning this test can be used) for the duration of the  Covid-19 declaration under Section 564(b)(1) of the Act, 21  U.S.C. section 360bbb-3(b)(1), unless the authorization is  terminated or revoked. Performed at [facility], [place] [address], [place], [place] [address]   MRSA PCR Screening     Status: None   Collection Time: 09/29/20  8:23 PM   Specimen: Nasal Mucosa; Nasopharyngeal  Result Value Ref Range Status   MRSA by PCR NEGATIVE NEGATIVE Final    Comment:        The GeneXpert MRSA Assay (FDA approved for NASAL specimens only), is one component of a comprehensive MRSA colonization surveillance program. It is not intended to diagnose MRSA infection nor to guide or monitor treatment for MRSA infections. Performed at [facility], [place] [address], [place], [place] [address]          Radiology Studies: NM GI Blood Loss  Result Date: 09/29/2020 CLINICAL DATA:  Lower GI bleeding.  Decreased hemoglobin EXAM: NUCLEAR MEDICINE GASTROINTESTINAL BLEEDING SCAN TECHNIQUE: Sequential abdominal images were obtained following intravenous administration of Tc-95mlabeled red blood cells. Imaging was performed for 2 hours. RADIOPHARMACEUTICALS:  22.6 mCi Tc-966mertechnetate in-vitro labeled red cells. COMPARISON:  None. FINDINGS: Normal blood pool activity is seen. No radiotracer accumulation in small or large bowel seen. IMPRESSION: No evidence of active GI  bleeding. Electronically Signed   By: JoMarlaine Hind.D.   On: 09/29/2020 16:44        Scheduled Meds: . sodium chloride   Intravenous Once  . sodium chloride   Intravenous Once  . Chlorhexidine Gluconate Cloth  6 each Topical Daily  . dexamethasone (DECADRON) injection  4 mg Intravenous Q12H  . guaiFENesin  600 mg Oral BID  . mouth rinse  15 mL Mouth Rinse BID  . OLANZapine  2.5 mg Oral QHS  . oxyCODONE  15 mg Oral Q12H  . [START ON 10/02/2020] pantoprazole  40 mg Intravenous Q12H  . peg 3350 powder  0.5 kit Oral Once   And  . peg 3350 powder  0.5 kit Oral Once  . polyethylene glycol  17 g Oral BID  . senna-docusate  1 tablet Oral BID  . sodium zirconium cyclosilicate  10 g Oral Once  . tamsulosin  0.4 mg Oral Daily   Continuous Infusions: . pantoprozole (PROTONIX) infusion 8 mg/hr (09/30/20 0937)     LOS: 8 days    Time spent: 35 mins,.More than 50% of that time was spent in counseling and/or coordination of care.      AmShelly CossMD [facility] P11/15/2021, 9:44 AM

## 2020-09-30 NOTE — Progress Notes (Signed)
Chaplain engaged in initial visit with Mr. Grammatico, introducing herself and explaining her role.  Chaplain expressed that she was following-up from a previous chaplain's referral.  Mr. Egerton did not seem up for a visit at this moment, but chaplain let Mr. Kinnan know she would be reaching out to his daughter to offer her support as well.  Mr. Baillie noted that he doubted there was anything chaplain could do for him, chaplain explained her role and offered support.    Chaplain reached out to daughter Estill Bamberg.  Estill Bamberg noted that it wasn't a good time to talk.  Per follow-up from previous chaplain, chaplain let Estill Bamberg know that if she needed help or resources concerning staying in [place] while her father is in the hospital, she has the paperwork for a free resource for her.  Chaplain gave Estill Bamberg her number.  Chaplain will follow-up as needed.     09/30/20 1000  Clinical Encounter Type  Visited With Patient and family together  Visit Type Initial;Follow-up  Referral From Chaplain  Consult/Referral To Chaplain

## 2020-10-01 ENCOUNTER — Ambulatory Visit
Admit: 2020-10-01 | Discharge: 2020-10-01 | Disposition: A | Discharge status: Home / Self Care | Payer: Self-pay | Attending: Radiation Oncology | Admitting: Radiation Oncology

## 2020-10-01 ENCOUNTER — Ambulatory Visit: Admit: 2020-10-01 | Payer: Self-pay

## 2020-10-01 LAB — HEMOGLOBIN AND HEMATOCRIT, BLOOD
HCT: 25.2 % — ABNORMAL LOW (ref 39.0–52.0)
HCT: 23.3 % — ABNORMAL LOW (ref 39.0–52.0)
Hemoglobin: 8.4 g/dL — ABNORMAL LOW (ref 13.0–17.0)
Hemoglobin: 8.1 g/dL — ABNORMAL LOW (ref 13.0–17.0)

## 2020-10-01 LAB — BASIC METABOLIC PANEL WITH GFR
Anion gap: 6 (ref 5–15)
BUN: 54 mg/dL — ABNORMAL HIGH (ref 6–20)
CO2: 21 mmol/L — ABNORMAL LOW (ref 22–32)
Calcium: 7.9 mg/dL — ABNORMAL LOW (ref 8.9–10.3)
Chloride: 106 mmol/L (ref 98–111)
Creatinine, Ser: 1.62 mg/dL — ABNORMAL HIGH (ref 0.61–1.24)
GFR, Estimated: 49 mL/min — ABNORMAL LOW
Glucose, Bld: 96 mg/dL (ref 70–99)
Potassium: 5.4 mmol/L — ABNORMAL HIGH (ref 3.5–5.1)
Sodium: 133 mmol/L — ABNORMAL LOW (ref 135–145)

## 2020-10-01 MED ORDER — SODIUM ZIRCONIUM CYCLOSILICATE 10 G PO PACK
10.0000 g | PACK | Freq: Every day | ORAL | Status: DC
  Administered 2020-10-01 – 2020-10-04 (×4): 10 g via ORAL
  Filled 2020-10-01 (×4): qty 1

## 2020-10-01 NOTE — Progress Notes (Signed)
SLP Cancellation Note  Patient Details Name: Tyler Barker MRN: [id number] DOB: 1960-12-07   Cancelled treatment:       Reason Eval/Treat Not Completed: Patient at procedure or test/unavailable. Will continue efforts to complete BSE.  [name] B. Quentin Ore, [facility], [place] Speech Language Pathologist Office: [phone] Pager: [phone]  Shonna Chock 10/01/2020, 11:54 AM

## 2020-10-01 NOTE — TOC Initial Note (Signed)
Transition of Care [facility]) - Initial/Assessment Note    Patient Details  Name: Tyler Barker MRN: [id number] Date of Birth: 1960-12-17  Transition of Care [facility]) CM/SW Contact:    Leeroy Cha, RN Phone Number: 10/01/2020, 7:38 AM  Clinical Narrative:                  59 year old man who presented to the emergency department for the evaluation of back pain/lower extremity numbness, difficulty ambulation.  Imagings done in the emergency department showed widespread metastatic disease from lung cancer.  He underwent lymph node biopsy which revealed metastatic none small cell lung cancer.  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 He was seen by oncology, radiation oncology, palliative medicine, IR and neurosurgery during this hospitalization.  Hospital course remarkable for new onset GI bleed 11/13, hypertension, AKI.  GI consulted and the plan is  for colonoscopy/EGD.  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  Denies any abdominal pain.  His hemoglobin dropped, blood pressure dropped requiring IV fluids, PPI.  GI consulted and following.  Plan for EGD/colonoscopy after discussion with family, patient is currently refusing..  Continue PPI, currently on clear liquid diet. He was also reporting constipation for more than a week.  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  Oncology, radiation  oncology, neurosurgery were following during this hospitalization.  Plan is for palliative radiation therapy followed by kyphoplasty at T7.  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  Continue steroid.  Palliative care also following  Following for poss home with hospice or residential hospice  Following for progression.  Expected Discharge Plan: Home w Hospice Care Barriers to Discharge: Continued Medical Work up, Inadequate or no insurance   Patient Goals and CMS Choice Patient states their goals for this hospitalization and ongoing recovery are:: to go home CMS Medicare.gov Compare Post Acute Care list provided to:: Patient Choice offered to / list presented to : Patient  Expected Discharge Plan and Services Expected Discharge Plan: Home w Hospice Care In-house Referral: Chaplain, Museum/gallery curator Counselor Discharge Planning Services: CM Consult   Living arrangements for the past 2 months: Single Family Home                                      Prior Living Arrangements/Services Living arrangements for the past 2 months: Single Family Home Lives with:: Self Patient language and need for interpreter reviewed:: Yes Do you feel safe going back to the place where you live?: Yes      Need for Family Participation in Patient Care: Yes (Comment) Care giver support system in place?: Yes (comment)   Criminal Activity/Legal Involvement Pertinent to Current Situation/Hospitalization: No - Comment as needed  Activities  of Daily Living Home Assistive Devices/Equipment: None ADL Screening (condition at time of admission) Patient's cognitive ability adequate to safely complete daily activities?: Yes Is the patient deaf or have difficulty hearing?: No Does the patient have difficulty seeing, even when wearing glasses/contacts?: No Does the patient have difficulty concentrating, remembering, or making decisions?:  No Patient able to express need for assistance with ADLs?: Yes Does the patient have difficulty dressing or bathing?: No Independently performs ADLs?: No Communication: Independent Dressing (OT): Independent Grooming: Independent Feeding: Independent Bathing: Independent Toileting: Independent In/Out Bed: Needs assistance Is this a change from baseline?: Change from baseline, expected to last <3 days Walks in Home: Needs assistance Is this a change from baseline?: Change from baseline, expected to last <3 days Does the patient have difficulty walking or climbing stairs?: Yes Weakness of Legs: Both Weakness of Arms/Hands: None  Permission Sought/Granted                  Emotional Assessment Appearance:: Appears stated age Attitude/Demeanor/Rapport: Apprehensive Affect (typically observed): Frustrated Orientation: : Oriented to Self, Oriented to Place, Oriented to  Time, Oriented to Situation Alcohol / Substance Use: Not Applicable Psych Involvement: No (comment)  Admission diagnosis:  Lung mass [R91.8] Metastasis to brain [facility]) [C79.31] Metastatic disease ([place]) [C79.9] Leg weakness, bilateral [R29.898] Closed T7 fracture ([place]) [S22.069A] Malignant neoplasm metastatic to bone [facility]) [C79.51] Patient Active Problem List   Diagnosis Date Noted  . Anemia due to GI blood loss   . Hematochezia   . Palliative care by specialist   . Cancer associated pain   . DNR (do not resuscitate) discussion   . 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 Metastasis to brain ([place]) 09/23/2020  . Metastatic disease ([place]) 09/22/2020  . Renal insufficiency 09/22/2020  . Current smoker 09/22/2020  . Acute ischemic stroke ([place]) 09/22/2020  . Lung mass 09/22/2020  . Liver lesion 09/22/2020   PCP:  Patient, No Pcp Per Pharmacy:  No Pharmacies Listed    Social Determinants of Health (SDOH) Interventions    Readmission Risk Interventions No flowsheet data found.

## 2020-10-01 NOTE — Progress Notes (Addendum)
[facility] Progress Note  CC:  Hematochezia   Subjective: He is somewhat confused today, he answers questions with delayed then repetitive response. Some agitation over night, received Ativan. His daughter Estill Bamberg is at the bedside. No further rectal bleeding overnight or thus far today. He does not wish to purse a colonoscopy.   Objective:  Vital signs in last 24 hours: Temp:  [97.7 F (36.5 C)-98.6 F (37 C)] 97.7 F (36.5 C) (11/16 0800) Pulse Rate:  [74-113] 74 (11/16 1000) Resp:  [8-25] 10 (11/16 1000) BP: (90-139)/(45-84) 111/54 (11/16 1000) SpO2:  [95 %-100 %] 98 % (11/16 1000) Last BM Date: 09/30/20 General:   Alert, drowsy but awake sitting up in the bed in NAD.  Heart: RRR, no murmur. Pulm: Breath sounds clear throughout.  Abdomen: Soft, nontender. + BS x 4 quads. Extremities:  LEs with 1+ edema.  Neurologic:  Alert and  oriented x 3. Speech is mostly clear. LE weakness.  Psych:  Alert and cooperative. Normal mood and affect.  Intake/Output from previous day: 11/15 0701 - 11/16 0700 In: 894.7 [P.O.:720; I.V.:97.1; IV Piggyback:77.6] Out: 1700 [Urine:1700] Intake/Output this shift: Total I/O In: -  Out: 725 [Urine:725]  Lab Results: Recent Labs    09/28/20 2138 09/28/20 2138 09/29/20 0705 09/29/20 1243 09/30/20 0100 09/30/20 0625 09/30/20 1915 10/01/20 0043 10/01/20 0746  WBC 17.8*  --  19.4*  --  13.6*  --   --   --   --   HGB 11.3*   < > 9.7*   < > 8.0*   < > 9.2* 8.4* 8.1*  HCT 34.2*   < > 28.7*   < > 22.9*   < > 26.8* 25.2* 23.3*  PLT 265  --  245  --   --   --   --   --   --    < > = values in this interval not displayed.   BMET Recent Labs    09/29/20 1243 09/30/20 0100 10/01/20 0043  NA 124* 130* 133*  K 5.1 5.4* 5.4*  CL 97* 106 106  CO2 21* 17* 21*  GLUCOSE 122* 112* 96  BUN 77* 59* 54*  CREATININE 2.04* 1.51* 1.62*  CALCIUM 7.2* 6.8* 7.9*   LFT No results for input(s): PROT, ALBUMIN, AST, ALT, ALKPHOS,  BILITOT, BILIDIR, IBILI in the last 72 hours. PT/INR Recent Labs    09/29/20 1315  LABPROT 13.6  INR 1.1   Hepatitis Panel No results for input(s): HEPBSAG, HCVAB, HEPAIGM, HEPBIGM in the last 72 hours.  NM GI Blood Loss  Result Date: 09/29/2020 CLINICAL DATA:  Lower GI bleeding.  Decreased hemoglobin EXAM: NUCLEAR MEDICINE GASTROINTESTINAL BLEEDING SCAN TECHNIQUE: Sequential abdominal images were obtained following intravenous administration of Tc-80m labeled red blood cells. Imaging was performed for 2 hours. RADIOPHARMACEUTICALS:  22.6 mCi Tc-61m pertechnetate in-vitro labeled red cells. COMPARISON:  None. FINDINGS: Normal blood pool activity is seen. No radiotracer accumulation in small or large bowel seen. IMPRESSION: No evidence of active GI bleeding. Electronically Signed   By: Marlaine Hind M.D.   On: 09/29/2020 16:44    Assessment / Plan:  24. 59 year old male with hematochezia and anemia. Hg 11.3 -> 9.7 -> 8.7 -> 8.1. Transfused 1 unit or PRBCs 11/14 -> Hg 8.0 -> 8.5.  Tagged red blood cell scan 11/14 was negative.  He passed a moderate amount of dark red blood and bright red blood per the rectum 11/15, no further hematochezia today.  He remains hemodynamically stable. -Monitor H&H closely -Transfuse for Hg < 7 -Monitor bloody bowel movements  -Patient declined colonoscopy   2. New diagnosis of non-small cell lung cancer with brain mets and a pathologic fracture at T7 with possible cord compression  3.AKI on  CKD stage III. Cr 2.0 -> 1.51 -> 1.62  4. Cancer related pain/nausea -Pain management per CCM -Zofran 4mg   IV Q 6hrs PRN  Further recommendations per Dr. 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  Agree with interval progress note as outlined above.  Patient does not want colonoscopy.  I think this is reasonable given his overall clinical picture and prognosis.  Recommend supportive measures and palliative care.  Transfuse as needed.  We will sign off.  Docia Chuck. Geri Seminole., M.D. [facility] Division of Gastroenterology

## 2020-10-01 NOTE — Progress Notes (Signed)
SLP Cancellation Note  Patient Details Name: Tyler Barker MRN: [id number] DOB: 1961-06-19   Cancelled treatment:       Reason Eval/Treat Not Completed: Other (comment) Pt now back from radiation. Spoke with daughter Estill Bamberg, who requests allowing pt some time to rest after radiation before proceeding with assessment. RN reports pt is currently receiving a full liquid diet. Mentation appears to be highly variable. SLP will continue efforts.  Yaritza Leist B. Quentin Ore, [facility], [place] Speech Language Pathologist Office: [phone] Pager: [phone]  Shonna Chock 10/01/2020, 1:15 PM

## 2020-10-01 NOTE — Progress Notes (Addendum)
PROGRESS NOTE    Tyler Barker  ZOX:[id number] DOB: [birth year] DOA: 09/21/2020 PCP: Patient, No Pcp Per   Chief Complain: Gait problem  Brief Narrative: Patient is a 59 year old man who presented to the emergency department for the evaluation of back pain/lower extremity numbness, difficulty ambulation.  Imagings done in the emergency department showed widespread metastatic disease from lung cancer.  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 He was seen by oncology, radiation oncology, palliative medicine, IR and neurosurgery during this hospitalization.  Hospital course remarkable for new onset GI bleed 11/13, hypotension, AKI.  GI consulted and following.  Patient declined colonoscopy.  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    His hemoglobin dropped, blood pressure dropped requiring IV fluids, PPI.  Nuclear bleeding scan did not show any active bleeding 11/14.  GI consulted and following.  GI recommended colonoscopy but patient declined.  Diet advanced to full.  He denies any abdominal pain.  No hematochezia today. Currently hemoglobin stable in the range of 8.  He was also reporting constipation for more than a week.  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  Oncology, radiation oncology, neurosurgery were following during this  hospitalization.  Plan is for palliative radiation therapy followed by kyphoplasty at T7.  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  Continue steroid.  Palliative care also following. We have requested radiation oncology about further management plan/recommendation  Acute urinary retention: Most likely secondary to brain/spinal mets.  Foley catheter placed.  Leukocytosis: Thought to be from steroids.  Continues to improve.  Few scattered subcentimeter acute ischemic nonhemorrhagic infarct: As per MRI.  Case discussed with neurology and recommended to start on aspirin 81 mg.  Aspirin has been stopped due to GI bleed.  Hyperkalemia: Continue monitor,continue with Lokelma.  AKI on CKD stage III: Secondary to dehydration/urinary retention.  CT abdomen/pelvis did not show any obstructive nephropathy.  Continue to monitor kidney function.  Cancer-related pain/nausea: Continue supportive care, pain medications, antiemetics.  Also started on Zyprexa for refractory nausea.  Goals of care: Young patient with newly diagnosed metastatic lung cancer.  Very poor prognosis.  Poor quality of life.  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  Remains full code.  We recommend hospice/palliative approach.  Disposition: PT/OT have recommended skilled nursing facility for placement.           DVT prophylaxis:SCD Code Status: Full Family Communication:daughter  at bedside on 12/02/19 Status is: Inpatient  Remains inpatient appropriate because:Inpatient level of care appropriate due to severity of illness   Dispo: The patient is from: Home  Anticipated d/c is to: Skilled nursing facility              Anticipated d/c date is: not sure              Patient currently is not medically stable to d/c.  Palliative care discussing with family recommending  hospice/palliative approach.  Will await radiation oncology recommendation. Discharge planning after above   Consultants: GI, oncology, radiation oncology, palliative care, neurosurgery  Procedures: Biopsy  Antimicrobials:  Anti-infectives (From admission, onward)   None      Subjective: Patient seen and examined at the bedside this morning.  Hemodynamically stable.  Denies any abdominal pain.  No further rectal bleeding.  Denies colonoscopy.  Alert and oriented and was asking for food  Objective: Vitals:   10/01/20 0400 10/01/20 0500 10/01/20 0600 10/01/20 0700  BP:  (!) 102/54 (!) 104/47 (!) 107/45  Pulse: 90 80 78 81  Resp: '16 12 15 12  ' Temp: 98 F (36.7 C)     TempSrc: Oral     SpO2: 100% 98% 95% 97%  Weight:      Height:        Intake/Output Summary (Last 24 hours) at 10/01/2020 0813 Last data filed at 10/01/2020 0700 Gross per 24 hour  Intake 626.42 ml  Output 1050 ml  Net -423.58 ml   Filed Weights   09/21/20 1545 09/28/20 0632 09/29/20 0700  Weight: 90.7 kg 83.7 kg 85.8 kg    Examination:  General exam: Not in obvious distress, generalized weakness HEENT:PERRL,Oral mucosa moist, Ear/Nose normal on gross exam Respiratory system: Bilateral equal air entry, normal vesicular breath sounds, no wheezes or crackles  Cardiovascular system: S1 & S2 heard, RRR. No JVD, murmurs, rubs, gallops or clicks.  Trace pedal edema. Gastrointestinal system: Abdomen is nondistended, soft and nontender. No organomegaly or masses felt. Normal bowel sounds heard. Central nervous system: Alert and oriented. No focal neurological deficits. Extremities: No edema, no clubbing ,no cyanosis Skin: No rashes, lesions or ulcers,no icterus ,no pallor GU: Foley  Data Reviewed: I have personally reviewed following labs and imaging studies  CBC: Recent Labs  Lab 09/25/20 0345 09/25/20 0345 09/28/20 0800 09/28/20 0800 09/28/20 2138 09/28/20 2138 09/29/20 0705 09/29/20 1243  09/30/20 0100 09/30/20 0625 09/30/20 1915 10/01/20 0043 10/01/20 0746  WBC 15.4*  --  14.1*  --  17.8*  --  19.4*  --  13.6*  --   --   --   --   NEUTROABS  --   --  11.6*  --  14.9*  --   --   --  11.7*  --   --   --   --   HGB 12.1*   < > 13.2   < > 11.3*   < > 9.7*   < > 8.0* 8.5* 9.2* 8.4* 8.1*  HCT 35.7*   < > 38.6*   < > 34.2*   < > 28.7*   < > 22.9* 24.4* 26.8* 25.2* 23.3*  MCV 96.7  --  96.3  --  97.7  --  98.0  --  93.5  --   --   --   --   PLT 270  --  287  --  265  --  245  --   --   --   --   --   --    < > = values in this interval not displayed.   Basic Metabolic Panel: Recent Labs  Lab 09/25/20 0345  09/25/20 0345 09/28/20 0800 09/29/20 0646 09/29/20 1243 09/30/20 0100 10/01/20 0043  NA 134*   < > 131* 126* 124* 130* 133*  K 4.5   < > 5.5* 5.9* 5.1 5.4* 5.4*  CL 102   < > 95* 92* 97* 106 106  CO2 24   < > 29 23 21* 17* 21*  GLUCOSE 138*   < > 115* 116* 122* 112* 96  BUN 31*   < > 48* 80* 77* 59* 54*  CREATININE 1.64*   < > 1.68* 2.34* 2.04* 1.51* 1.62*  CALCIUM 8.6*   < > 8.6* 8.1* 7.2* 6.8* 7.9*  MG 2.0  --   --   --   --   --   --   PHOS 3.6  --   --   --   --   --   --    < > = values in this interval not displayed.   GFR: Estimated Creatinine Clearance: 51.9 mL/min (A) (by C-G formula based on SCr of 1.62 mg/dL (H)). Liver Function Tests: No results for input(s): AST, ALT, ALKPHOS, BILITOT, PROT, ALBUMIN in the last 168 hours. No results for input(s): LIPASE, AMYLASE in the last 168 hours. No results for input(s): AMMONIA in the last 168 hours. Coagulation Profile: Recent Labs  Lab 09/29/20 1315  INR 1.1   Cardiac Enzymes: No results for input(s): CKTOTAL, CKMB, CKMBINDEX, TROPONINI in the last 168 hours. BNP (last 3 results) No results for input(s): PROBNP in the last 8760 hours. HbA1C: No results for input(s): HGBA1C in the last 72 hours. CBG: Recent Labs  Lab 09/29/20 2127  GLUCAP 109*   Lipid Profile: No results for input(s): CHOL,  HDL, LDLCALC, TRIG, CHOLHDL, LDLDIRECT in the last 72 hours. Thyroid Function Tests: No results for input(s): TSH, T4TOTAL, FREET4, T3FREE, THYROIDAB in the last 72 hours. Anemia Panel: No results for input(s): VITAMINB12, FOLATE, FERRITIN, TIBC, IRON, RETICCTPCT in the last 72 hours. Sepsis Labs: No results for input(s): PROCALCITON, LATICACIDVEN in the last 168 hours.  Recent Results (from the past 240 hour(s))  Respiratory Panel by RT PCR (Flu A&B, Covid) - Nasopharyngeal Swab     Status: None   Collection Time: 09/22/20  2:38 AM   Specimen: Nasopharyngeal Swab  Result Value Ref Range Status   SARS Coronavirus 2 by RT PCR NEGATIVE NEGATIVE Final    Comment: (NOTE) SARS-CoV-2 target nucleic acids are NOT DETECTED.  The SARS-CoV-2 RNA is generally detectable in upper respiratoy specimens during the acute phase of infection. The lowest concentration of SARS-CoV-2 viral copies this assay can detect is 131 copies/mL. A negative result does not preclude SARS-Cov-2 infection and should not be used as the sole basis for treatment or other patient management decisions. A negative result may occur with  improper specimen collection/handling, submission of specimen other than nasopharyngeal swab, presence of viral mutation(s) within the areas targeted by this assay, and inadequate number of viral copies (<131 copies/mL). A negative result must be combined with clinical observations, patient history, and epidemiological information. The expected result is Negative.  Fact Sheet for Patients:  PinkCheek.be  Fact Sheet for Healthcare Providers:  GravelBags.it  This test is no t yet approved or cleared by the [place] FDA and  has been authorized for detection and/or diagnosis of SARS-CoV-2 by FDA under an Emergency Use Authorization (EUA). This EUA will remain  in effect (meaning this test can be used) for the duration of  the COVID-19 declaration under Section 564(b)(1)  of the Act, 21 U.S.C. section 360bbb-3(b)(1), unless the authorization is terminated or revoked sooner.     Influenza A by PCR NEGATIVE NEGATIVE Final   Influenza B by PCR NEGATIVE NEGATIVE Final    Comment: (NOTE) The Xpert Xpress SARS-CoV-2/FLU/RSV assay is intended as an aid in  the diagnosis of influenza from Nasopharyngeal swab specimens and  should not be used as a sole basis for treatment. Nasal washings and  aspirates are unacceptable for Xpert Xpress SARS-CoV-2/FLU/RSV  testing.  Fact Sheet for Patients: PinkCheek.be  Fact Sheet for Healthcare Providers: GravelBags.it  This test is not yet approved or cleared by the [place] FDA and  has been authorized for detection and/or diagnosis of SARS-CoV-2 by  FDA under an Emergency Use Authorization (EUA). This EUA will remain  in effect (meaning this test can be used) for the duration of the  Covid-19 declaration under Section 564(b)(1) of the Act, 21  U.S.C. section 360bbb-3(b)(1), unless the authorization is  terminated or revoked. Performed at [facility], [place] [address] [place], [place] [address]   MRSA PCR Screening     Status: None   Collection Time: 09/29/20  8:23 PM   Specimen: Nasal Mucosa; Nasopharyngeal  Result Value Ref Range Status   MRSA by PCR NEGATIVE NEGATIVE Final    Comment:        The GeneXpert MRSA Assay (FDA approved for NASAL specimens only), is one component of a comprehensive MRSA colonization surveillance program. It is not intended to diagnose MRSA infection nor to guide or monitor treatment for MRSA infections. Performed at [facility], [place] [address] [place], [place] [address]          Radiology Studies: NM GI Blood Loss  Result Date: 09/29/2020 CLINICAL DATA:  Lower GI bleeding.  Decreased hemoglobin EXAM: NUCLEAR MEDICINE GASTROINTESTINAL  BLEEDING SCAN TECHNIQUE: Sequential abdominal images were obtained following intravenous administration of Tc-50mlabeled red blood cells. Imaging was performed for 2 hours. RADIOPHARMACEUTICALS:  22.6 mCi Tc-940mertechnetate in-vitro labeled red cells. COMPARISON:  None. FINDINGS: Normal blood pool activity is seen. No radiotracer accumulation in small or large bowel seen. IMPRESSION: No evidence of active GI bleeding. Electronically Signed   By: JoMarlaine Hind.D.   On: 09/29/2020 16:44        Scheduled Meds: . sodium chloride   Intravenous Once  . sodium chloride   Intravenous Once  . Chlorhexidine Gluconate Cloth  6 each Topical Daily  . dexamethasone (DECADRON) injection  4 mg Intravenous Q12H  . guaiFENesin  600 mg Oral BID  . mouth rinse  15 mL Mouth Rinse BID  . OLANZapine  2.5 mg Oral QHS  . oxyCODONE  15 mg Oral Q12H  . [START ON 10/02/2020] pantoprazole  40 mg Intravenous Q12H  . peg 3350 powder  0.5 kit Oral Once   And  . peg 3350 powder  0.5 kit Oral Once  . polyethylene glycol  17 g Oral BID  . senna-docusate  1 tablet Oral BID  . sodium zirconium cyclosilicate  10 g Oral Daily  . tamsulosin  0.4 mg Oral Daily   Continuous Infusions: . pantoprozole (PROTONIX) infusion 8 mg/hr (09/30/20 2305)     LOS: 9 days    Time spent: 35 mins,.More than 50% of that time was spent in counseling and/or coordination of care.      AmShelly CossMD [facility] P11/16/2021, 8:13 AM

## 2020-10-02 ENCOUNTER — Ambulatory Visit
Admit: 2020-10-02 | Discharge: 2020-10-02 | Disposition: A | Discharge status: Home / Self Care | Payer: Self-pay | Attending: Radiation Oncology | Admitting: Radiation Oncology

## 2020-10-02 DIAGNOSIS — Z66 Do not resuscitate: Secondary | ICD-10-CM

## 2020-10-02 LAB — CBC WITH DIFFERENTIAL/PLATELET
Abs Immature Granulocytes: 0.29 10*3/uL — ABNORMAL HIGH (ref 0.00–0.07)
Basophils Absolute: 0 10*3/uL (ref 0.0–0.1)
Basophils Relative: 0 %
Eosinophils Absolute: 0 10*3/uL (ref 0.0–0.5)
Eosinophils Relative: 0 %
HCT: 22.1 % — ABNORMAL LOW (ref 39.0–52.0)
Hemoglobin: 7.7 g/dL — ABNORMAL LOW (ref 13.0–17.0)
Immature Granulocytes: 2 %
Lymphocytes Relative: 4 %
Lymphs Abs: 0.6 10*3/uL — ABNORMAL LOW (ref 0.7–4.0)
MCH: 32.8 pg (ref 26.0–34.0)
MCHC: 34.8 g/dL (ref 30.0–36.0)
MCV: 94 fL (ref 80.0–100.0)
Monocytes Absolute: 0.9 10*3/uL (ref 0.1–1.0)
Monocytes Relative: 6 %
Neutro Abs: 13 10*3/uL — ABNORMAL HIGH (ref 1.7–7.7)
Neutrophils Relative %: 88 %
Platelets: 209 10*3/uL (ref 150–400)
RBC: 2.35 MIL/uL — ABNORMAL LOW (ref 4.22–5.81)
RDW: 14.1 % (ref 11.5–15.5)
WBC: 14.8 10*3/uL — ABNORMAL HIGH (ref 4.0–10.5)
nRBC: 0.1 % (ref 0.0–0.2)

## 2020-10-02 LAB — BASIC METABOLIC PANEL
Anion gap: 7 (ref 5–15)
BUN: 45 mg/dL — ABNORMAL HIGH (ref 6–20)
CO2: 21 mmol/L — ABNORMAL LOW (ref 22–32)
Calcium: 7.5 mg/dL — ABNORMAL LOW (ref 8.9–10.3)
Chloride: 103 mmol/L (ref 98–111)
Creatinine, Ser: 1.49 mg/dL — ABNORMAL HIGH (ref 0.61–1.24)
GFR, Estimated: 54 mL/min — ABNORMAL LOW (ref >60)
Glucose, Bld: 110 mg/dL — ABNORMAL HIGH (ref 70–99)
Potassium: 5.2 mmol/L — ABNORMAL HIGH (ref 3.5–5.1)
Sodium: 131 mmol/L — ABNORMAL LOW (ref 135–145)

## 2020-10-02 MED ORDER — PANTOPRAZOLE SODIUM 40 MG PO TBEC
40.0000 mg | DELAYED_RELEASE_TABLET | Freq: Two times a day (BID) | ORAL | Status: DC
  Administered 2020-10-02 – 2020-10-11 (×18): 40 mg via ORAL
  Filled 2020-10-02 (×20): qty 1

## 2020-10-02 NOTE — Progress Notes (Signed)
Patient ID: George Haggart, male   DOB: November 12, 1961, 59 y.o.   MRN: [id number]  This NP visited patient at the bedside as a follow up to last week's  GOCs meeting, re-addressing current medical treatment plan; diagnosis, prognosis, treatment , AD decisions and anticipatory care needs.  Chaplain/Matt Stainaker at bedside.  Daughter not present at this time.    Created space and opportunity for patient to explore the thoughts and feelings regarding patient's current medical situation.  Patient continues to verbalize his frustration with overall situation and plan of care.  Allowed space, therapeutic listening and emotional support.  Education and conversation with patient regarding the seriousness of his current medical situation.  He understands but it is difficult for him to accept.    Education offered on the difference between and aggressive medical intervention path and a palliative comfort path for this patient at this time in this situation.   Plan of care: -DNR/DNI- documented today and supported by daughter (Discussed with patient the importance of continued conversation with his family and the  medical providers regarding overall plan of care and treatment options,  ensuring decisions are within the context of the patients values and GOCs.) -Continue with completion of radiation treatments -take it one day at a time   I spoke to [place] by phone regarding above topics    PMT will continue to support holistically  Questions and concerns addressed   Discussed with bedside RN and Dr Tawanna Solo  Total time spent on the unit was 35 minutes  Greater than 50% of the time was spent in counseling and coordination of care  [name] NP  Palliative Medicine Team Team Phone # [phone] Pager [phone]

## 2020-10-02 NOTE — Progress Notes (Signed)
Report called to receiving RN [phone]. Patient with no complaints at the current time. Daughter at bedside. Will transfer after patient finishes dinner.

## 2020-10-02 NOTE — Evaluation (Signed)
SLP Cancellation Note  Patient Details Name: Tyler Barker MRN: [id number] DOB: 07/23/1961   Cancelled treatment:       Reason Eval/Treat Not Completed: Other (comment);Patient declined, no reason specified (Pt adamantly declined swallow evaluation after multiple explanations of clinical reasoning including lung cancer with mets and brain lesions that may cause dysphagia.     Pt reported desire for documentation of "there not being a problem swallowing and therefore the test is not needed".  He advised he will inform MD if he has problems swallowing.    Pt has repeatedly declined to participate in speech/swallow evaluations therefore please do not re-consult unless pt agreeable.    Kathleen Lime, MS [facility] SLP Acute Rehab Services Office [phone] Pager [phone]     Macario Golds 10/02/2020, 9:31 AM

## 2020-10-02 NOTE — Progress Notes (Signed)
Delayed Entry  I spoke with Hospitalist and Palliative Team yesterday. The patient is undergoing palliative radiation to multiple sites and has 6 fractions left to the T spine, and 8 fractions left to the sacrum and whole brain. There are also clearly issues going on with his GI tract but unfortunately the patient has declined further work up. We would hope that the patient may note some neurologic improvement with radiotherapy and in the sacrum and brain improve/preserve quality of life, hospice care also also been a part of our discussion. Meeting the patient where he is has been challenging for multiple providers, and his social situation remains guarded. While radiotherapy can continue to be administered and I think should continue, it can be administered inpt or outpt, as long as the patient is set up for success in completing these treatments both with placement and transportation. In speaking with palliative care, he has had some clinical decline over the last day or so. They agree that continuing with radiation would be the least disruptive to the overall plan, they will keep [place] informed if the clinical course changes to where we need to discontinue treatment, however the patient is not emotionally at a place where he's ready to change course of care. For now given all of the social issues as well, palliative care recommends continued inpt status.      Carola Rhine, PAC

## 2020-10-02 NOTE — Progress Notes (Addendum)
Occupational Therapy Progress Note  Patient with significant weakness and LEs and core control, very minimal AROM in LEs requiring total A x2 for bed mobility to EOB. Patient unable to tolerate any challenge to static sitting and heavily dependent on B UE support. Patient set up level for g/h, eating at bed level. Patient with poor prognosis, will continue to follow acutely for [place].     10/02/20 1303  OT Visit Information  Last OT Received On 10/02/20  Assistance Needed +2  PT/OT/SLP Co-Evaluation/Treatment Yes  Reason for Co-Treatment Complexity of the patient's impairments (multi-system involvement);For patient/therapist safety;To address functional/ADL transfers  OT goals addressed during session ADL's and self-care  History of Present Illness 59 yo male with workup revealing MRI (+) multiple vertebral metastases ,a large R parietal lobe metastatic lesion and multiple smaller enhancing brain metastases; a large hilar and mediastinal mass was partially intersected by the imaging planes MRI were a few scattered subcentimeter acute ischemic nonhemorrhagic infarcts involving the left frontal lobe, right periatrial white matter, and right occipital lobe. PMH hx back injury smoker  Precautions  Precautions Fall  Precaution Comments multiple spinal mets, paraplegia at this point  Pain Assessment  Pain Assessment Faces  Faces Pain Scale 8  Pain Location mid back  Pain Descriptors / Indicators Grimacing  Pain Intervention(s) Monitored during session  Cognition  Arousal/Alertness Awake/alert  Behavior During Therapy Anxious  Overall Cognitive Status Impaired/Different from baseline  Area of Impairment Attention  Current Attention Level Sustained  General Comments patient tangential, reports frustration with repeating same information to multiple staff,   ADL  Overall ADL's  Needs assistance/impaired  Eating/Feeding Modified independent;Bed level  Eating/Feeding Details (indicate cue type and  reason) drinking from cup with straw  Bed Mobility  Overal bed mobility Needs Assistance  Bed Mobility Rolling;Supine to Sit;Sit to Sidelying  Rolling Total assist;+2 for physical assistance;+2 for safety/equipment  Supine to sit Total assist;+2 for physical assistance;+2 for safety/equipment  Sit to sidelying Total assist;+2 for physical assistance;+2 for safety/equipment  General bed mobility comments patient assists with reaching for rails to roll to each side. Requires total assistance for legs and trunk to sit upright on bededge. Multimodal cues to move to left arm/side to return to bed, assist with legs.  Balance  Overall balance assessment Needs assistance  Sitting-balance support Bilateral upper extremity supported;Feet supported  Sitting balance-Leahy Scale Poor  Sitting balance - Comments requires support of UE's, when he moves arms, loses trunk control in all  directions  Transfers  General transfer comment NT  General Comments  General comments (skin integrity, edema, etc.) VSS on room air  OT - End of Session  Activity Tolerance Patient limited by pain  Patient left in bed;with call bell/phone within reach  Nurse Communication Mobility status  OT Assessment/Plan  OT Plan Discharge plan remains appropriate  OT Visit Diagnosis Unsteadiness on feet (R26.81);Muscle weakness (generalized) (M62.81);Pain  Pain - part of body  (back)  OT Frequency (ACUTE ONLY) Min 2X/week  Follow Up Recommendations SNF;Supervision/Assistance - 24 hour  OT Equipment Wheelchair (measurements OT);Wheelchair cushion (measurements OT);Hospital bed  AM-PAC OT "6 Clicks" Daily Activity Outcome Measure (Version 2)  Help from another person eating meals? 3  Help from another person taking care of personal grooming? 3  Help from another person toileting, which includes using toliet, bedpan, or urinal? 1  Help from another person bathing (including washing, rinsing, drying)? 2  Help from another person to  put on and taking off regular upper body  clothing? 2  Help from another person to put on and taking off regular lower body clothing? 1  6 Click Score 12  OT Goal Progression  Progress towards OT goals Not progressing toward goals - comment (significant LE and core weakness)  Acute Rehab OT Goals  Patient Stated Goal to get some answers and to walk and return to normal  OT Goal Formulation With patient  Time For Goal Achievement 10/08/20  Potential to Achieve Goals Fair  ADL Goals  Pt Will Perform Upper Body Bathing with supervision;sitting  Pt Will Perform Lower Body Bathing with min guard assist;bed level  Additional ADL Goal #1 Pt will complete sliding board transfer to drop arm commode supervision level  Additional ADL Goal #2 Pt will complete bed mobility supervision with use of bed rails  OT Time Calculation  OT Start Time (ACUTE ONLY) 1007  OT Stop Time (ACUTE ONLY) 1043  OT Time Calculation (min) 36 min  OT General Charges  $OT Visit 1 Visit  OT Treatments  $Self Care/Home Management  8-22 mins   Delbert Phenix OT OT pager: [phone]

## 2020-10-02 NOTE — Progress Notes (Signed)
Physical Therapy Treatment Patient Details Name: Tyler Barker MRN: [id number] DOB: 1960-12-25 Today's Date: 10/02/2020    History of Present Illness 59 yo male with workup revealing MRI (+) multiple vertebral metastases ,a large R parietal lobe metastatic lesion and multiple smaller enhancing brain metastases; a large hilar and mediastinal mass was partially intersected by the imaging planes MRI were a few scattered subcentimeter acute ischemic nonhemorrhagic infarcts involving the left frontal lobe, right periatrial white matter, and right occipital lobe. PMH hx back injury smoker    PT Comments    Patient reports that his leg movement has worsened, appears less active movement than on PT eval. Patient reports back pain worsening when assisted to sitting. Patient has poor trunk control. PT functional goals are limited given the overall prognosis for functional recovery being poor. Will follow along to determine if skilled PT  is indicated.   Follow Up Recommendations  SNF;Supervision/Assistance - 24 hour     Equipment Recommendations  None recommended by PT    Recommendations for Other Services       Precautions / Restrictions Precautions Precautions: Fall Precaution Comments: multiple spanl mets, paraplegia at this point    Mobility  Bed Mobility   Bed Mobility: Rolling;Supine to Sit;Sit to Sidelying Rolling: Total assist;+2 for physical assistance;+2 for safety/equipment       Sit to sidelying: Total assist;+2 for physical assistance;+2 for safety/equipment General bed mobility comments: patient assists with reaching for rails to roll to each side.Requires total  assistance for legs and  trunk to sit upright on bededge. Multimodal cues to move to left arm/side to return to  bed, assist with legs.  Transfers                 General transfer comment: NT  Ambulation/Gait                 Stairs             Wheelchair Mobility    Modified Rankin  (Stroke Patients Only)       Balance Overall balance assessment: Needs assistance Sitting-balance support: Bilateral upper extremity supported;Feet supported Sitting balance-Leahy Scale: Poor Sitting balance - Comments: requires support of UE's, when he moves arms, loses trunk control in all  directions                                    Cognition Arousal/Alertness: Awake/alert Behavior During Therapy: Restless;Anxious   Area of Impairment: Attention                   Current Attention Level: Sustained   Following Commands: Follows one step commands with increased time   Awareness: Emergent   General Comments: patient talking conastatntkly, stating he  is tired of telling things over and over to multiple persons. patient      Exercises      General Comments        Pertinent Vitals/Pain Faces Pain Scale: Hurts whole lot Pain Location: mid back Pain Descriptors / Indicators: Grimacing Pain Intervention(s): Premedicated before session;Repositioned;Monitored during session;Limited activity within patient's tolerance    Home Living                      Prior Function            PT Goals (current goals can now be found in the care plan section) Progress towards PT goals: Not  progressing toward goals - comment (motor/sensory have worsened)    Frequency    Min 2X/week      PT Plan Current plan remains appropriate    Co-evaluation PT/OT/SLP Co-Evaluation/Treatment: Yes Reason for Co-Treatment: Complexity of the patient's impairments (multi-system involvement);For patient/therapist safety;To address functional/ADL transfers PT goals addressed during session: Mobility/safety with mobility        AM-PAC PT "6 Clicks" Mobility   Outcome Measure  Help needed turning from your back to your side while in a flat bed without using bedrails?: Total Help needed moving from lying on your back to sitting on the side of a flat bed without  using bedrails?: Total Help needed moving to and from a bed to a chair (including a wheelchair)?: Total Help needed standing up from a chair using your arms (e.g., wheelchair or bedside chair)?: Total Help needed to walk in hospital room?: Total Help needed climbing 3-5 steps with a railing? : Total 6 Click Score: 6    End of Session   Activity Tolerance: Patient limited by pain Patient left: in bed;with call bell/phone within reach;with bed alarm set;with family/visitor present Nurse Communication: Mobility status;Need for lift equipment PT Visit Diagnosis: Muscle weakness (generalized) (M62.81);Other abnormalities of gait and mobility (R26.89)     Time: 8938-1017 PT Time Calculation (min) (ACUTE ONLY): 36 min  Charges:  $Therapeutic Activity: 8-22 mins                     Tyler Barker PT Acute Rehabilitation Services Pager [phone] Office [phone]    Tyler Barker 10/02/2020, 1:07 PM

## 2020-10-02 NOTE — Progress Notes (Signed)
PROGRESS NOTE    Tyler Barker  JQB:[id number] DOB: 03-Mar-1961 DOA: 09/21/2020 PCP: Patient, No Pcp Per   Chief Complain: Gait problem  Brief Narrative: Patient is a 59 year old man who presented to the emergency department for the evaluation of back pain/lower extremity numbness, difficulty ambulation.  Imagings done in the emergency department showed widespread metastatic disease from lung cancer.  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 He was seen by oncology, radiation oncology, palliative medicine, IR and neurosurgery during this hospitalization.  Hospital course remarkable for new onset GI bleed 11/13, hypotension, AKI.  GI consulted and following.  Patient declined colonoscopy. PT/OT recommended SNF on DC. Plan is to continue ongoing radiation therapy treatment and seeking placement.  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    His hemoglobin dropped, blood pressure dropped requiring IV fluids, PPI.  Nuclear bleeding scan did not show any active bleeding 11/14.  GI recommended colonoscopy but patient declined.  GI signed off.  He denies any abdominal pain.  No hematochezia today. Currently hemoglobin stable in the range of 7.  Due to persistent demand from the patient, regular diet has been started today.  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  Oncology, radiation oncology, neurosurgery were following during this hospitalization.  Plan is for palliative radiation therapy with possible  kyphoplasty at T7.  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  Continue steroid.  Palliative care also following. Radiation oncology recommended to continue radiation treatment to complete 10 treatment courses.  Today treatment 5/10.  He does not need to stay inhouse for that.  Acute urinary retention: Most likely secondary to brain/spinal mets.  Foley catheter placed.  Leukocytosis: Thought to be from steroids.  Continues to improve.  Few scattered subcentimeter acute ischemic nonhemorrhagic infarct: As per MRI.  Case discussed with neurology and recommended to start on aspirin 81 mg.  Aspirin has been stopped due to GI bleed.  Hyperkalemia: Continue monitor,continue with Lokelma.  AKI on CKD stage III: Secondary to dehydration/urinary retention.  CT abdomen/pelvis did not show any obstructive nephropathy.  Continue to monitor kidney function.  Cancer-related pain/nausea: Continue supportive care, pain medications, antiemetics.  Also started on Zyprexa for refractory nausea.  Goals of care: Young patient with newly diagnosed metastatic lung cancer.  Very poor prognosis.  Poor quality of life.  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  Remains full code.  We recommend hospice/palliative approach.  Disposition: PT/OT have recommended skilled nursing facility for placement.  Difficult placement.  Patient is from [name].  He does not have insurance.           DVT prophylaxis:SCD  Code Status: Full Family Communication:daughter  at bedside on 12/03/19 Status is: Inpatient  Remains inpatient appropriate because:Inpatient level of care appropriate due to severity of  illness   Dispo: The patient is from: Home              Anticipated d/c is to: Skilled nursing facility              Anticipated d/c date is: not sure               Patient is medically stable for discharge to skilled nursing facility as soon as bed is available.   Consultants: GI, oncology, radiation oncology, palliative care, neurosurgery  Procedures: Biopsy  Antimicrobials:  Anti-infectives (From admission, onward)   None      Subjective: Patient seen and examined at the bedside this morning.  Hemodynamically stable during my evaluation.  Comfortable.  Denies any abdominal pain, nausea or vomiting.  No rectal bleeding or bloody bowel movement.  Persistently asking for regular diet.  Objective: Vitals:   10/02/20 0343 10/02/20 0400 10/02/20 0500 10/02/20 0600  BP:  124/62  127/61  Pulse:  99 89 88  Resp:  (!) 23 (!) 21 16  Temp: 97.8 F (36.6 C)     TempSrc: Oral     SpO2:  94% 96% 94%  Weight:      Height:        Intake/Output Summary (Last 24 hours) at 10/02/2020 0742 Last data filed at 10/01/2020 1739 Gross per 24 hour  Intake --  Output 1525 ml  Net -1525 ml   Filed Weights   09/21/20 1545 09/28/20 0632 09/29/20 0700  Weight: 90.7 kg 83.7 kg 85.8 kg    Examination:   General exam: Not in distress,average built HEENT:PERRL,Oral mucosa moist, Ear/Nose normal on gross exam Respiratory system: Bilateral equal air entry, normal vesicular breath sounds, no wheezes or crackles  Cardiovascular system: S1 & S2 heard, RRR. No JVD, murmurs, rubs, gallops or clicks. Gastrointestinal system: Abdomen is nondistended, soft and nontender. No organomegaly or masses felt. Normal bowel sounds heard. Central nervous system: Alert and oriented.  Paraplegia. Extremities: No edema, no clubbing ,no cyanosis Skin: No rashes, lesions or ulcers,no icterus ,no pallor   Data Reviewed: I have personally reviewed following labs and imaging studies  CBC: Recent Labs  Lab  09/28/20 0800 09/28/20 0800 09/28/20 2138 09/28/20 2138 09/29/20 0705 09/29/20 1243 09/30/20 0100 09/30/20 0100 09/30/20 0625 09/30/20 1915 10/01/20 0043 10/01/20 0746 10/02/20 0250  WBC 14.1*  --  17.8*  --  19.4*  --  13.6*  --   --   --   --   --  14.8*  NEUTROABS 11.6*  --  14.9*  --   --   --  11.7*  --   --   --   --   --  13.0*  HGB 13.2   < > 11.3*   < > 9.7*   < > 8.0*   < > 8.5* 9.2* 8.4* 8.1* 7.7*  HCT 38.6*   < > 34.2*   < > 28.7*   < > 22.9*   < > 24.4* 26.8* 25.2* 23.3* 22.1*  MCV 96.3  --  97.7  --  98.0  --  93.5  --   --   --   --   --  94.0  PLT 287  --  265  --  245  --   --   --   --   --   --   --  209   < > = values in this interval not displayed.   Basic Metabolic Panel: Recent Labs  Lab 09/29/20 0646 09/29/20 1243 09/30/20 0100 10/01/20 0043 10/02/20 0250  NA 126* 124* 130* 133* 131*  K 5.9* 5.1 5.4* 5.4* 5.2*  CL 92* 97* 106 106 103  CO2 23 21* 17* 21* 21*  GLUCOSE 116* 122* 112* 96 110*  BUN 80* 77* 59* 54* 45*  CREATININE 2.34* 2.04* 1.51* 1.62* 1.49*  CALCIUM 8.1* 7.2* 6.8* 7.9* 7.5*   GFR: Estimated Creatinine Clearance: 56.5 mL/min (A) (by C-G formula based on SCr of 1.49 mg/dL (H)). Liver Function Tests: No results for input(s): AST, ALT, ALKPHOS, BILITOT, PROT, ALBUMIN in the last 168 hours. No results for input(s): LIPASE, AMYLASE in the last 168 hours. No results for input(s): AMMONIA in the last 168 hours. Coagulation Profile: Recent Labs  Lab 09/29/20 1315  INR 1.1   Cardiac Enzymes: No results for input(s): CKTOTAL, CKMB, CKMBINDEX, TROPONINI in the last 168 hours. BNP (last 3 results) No results for input(s): PROBNP in the last 8760 hours. HbA1C: No results for input(s): HGBA1C in the last 72 hours. CBG: Recent Labs  Lab 09/29/20 2127  GLUCAP 109*   Lipid Profile: No results for input(s): CHOL, HDL, LDLCALC, TRIG, CHOLHDL, LDLDIRECT in the last 72 hours. Thyroid Function Tests: No results for input(s): TSH,  T4TOTAL, FREET4, T3FREE, THYROIDAB in the last 72 hours. Anemia Panel: No results for input(s): VITAMINB12, FOLATE, FERRITIN, TIBC, IRON, RETICCTPCT in the last 72 hours. Sepsis Labs: No results for input(s): PROCALCITON, LATICACIDVEN in the last 168 hours.  Recent Results (from the past 240 hour(s))  MRSA PCR Screening     Status: None   Collection Time: 09/29/20  8:23 PM   Specimen: Nasal Mucosa; Nasopharyngeal  Result Value Ref Range Status   MRSA by PCR NEGATIVE NEGATIVE Final    Comment:        The GeneXpert MRSA Assay (FDA approved for NASAL specimens only), is one component of a comprehensive MRSA colonization surveillance program. It is not intended to diagnose MRSA infection nor to guide or monitor treatment for MRSA infections. Performed at [facility], [place] [address], [place], [place] [address]          Radiology Studies: No results found.      Scheduled Meds: . sodium chloride   Intravenous Once  . sodium chloride   Intravenous Once  . Chlorhexidine Gluconate Cloth  6 each Topical Daily  . dexamethasone (DECADRON) injection  4 mg Intravenous Q12H  . guaiFENesin  600 mg Oral BID  . mouth rinse  15 mL Mouth Rinse BID  . OLANZapine  2.5 mg Oral QHS  . oxyCODONE  15 mg Oral Q12H  . pantoprazole  40 mg Intravenous Q12H  . peg 3350 powder  0.5 kit Oral Once   And  . peg 3350 powder  0.5 kit Oral Once  . polyethylene glycol  17 g Oral BID  . senna-docusate  1 tablet Oral BID  . sodium zirconium cyclosilicate  10 g Oral Daily  . tamsulosin  0.4 mg Oral Daily   Continuous Infusions: . pantoprozole (PROTONIX) infusion 8 mg/hr (10/02/20 0702)     LOS: 10 days    Time spent: 35 mins,.More than 50% of that time was spent in counseling and/or coordination of care.      Shelly Coss, MD [facility] P11/17/2021, 7:42 AM

## 2020-10-02 NOTE — Progress Notes (Signed)
Following up with pt and daughter, Estill Bamberg, around power of attorney.  Provided support at pt bedside during meeting with ML from [place]

## 2020-10-02 NOTE — TOC Progression Note (Addendum)
Transition of Care [facility]) - Progression Note    Patient Details  Name: Tyler Barker MRN: [id number] Date of Birth: Jul 02, 1961  Transition of Care [facility]) CM/SW Contact  Leeroy Cha, RN Phone Number: 10/02/2020, 7:43 AM  Clinical Narrative:     Young patient with newly diagnosed metastatic lung cancer.  Very poor prognosis.  Poor quality of life.  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  Remains full code.  We recommend hospice/palliative approach.  Disposition: PT/OT have recommended skilled nursing facility for placement. Plan is either for home with hospice or snf placement unclear at this time. Following for progression and toc needs. Expected Discharge Plan: Home w Hospice Care Barriers to Discharge: Continued Medical Work up, Inadequate or no insurance  Expected Discharge Plan and Services Expected Discharge Plan: Home w Hospice Care In-house Referral: Chaplain, Museum/gallery curator Counselor Discharge Planning Services: CM Consult   Living arrangements for the past 2 months: Single Family Home                                       Social Determinants of Health (SDOH) Interventions    Readmission Risk Interventions No flowsheet data found.

## 2020-10-02 NOTE — Progress Notes (Signed)
Chaplain engaged in follow-up visit with Mr. Olmeda and his daughter.  They wish to have documents notarized concerning finances and possessions.  Chaplain expressed that notary usually only works with healthcare.  Chaplain called AC who noted that Mr. Conry and his daughter could secure their own notary and have one come in to notarize those documents. Daughter also wished to know more about resource for housing while her dad is in the hospital.  Bonney Roussel is providing daughter with paperwork to utilize house for families of patients.   Madelaine Etienne will follow-up to secure paperwork for housing resource.  Chaplains available to follow-up as needed.    10/02/20 1200  Clinical Encounter Type  Visited With Patient and family together  Visit Type Follow-up;Social support  Stress Factors  Family Stress Factors  (Resources)

## 2020-10-03 ENCOUNTER — Ambulatory Visit
Admit: 2020-10-03 | Discharge: 2020-10-03 | Disposition: A | Discharge status: Home / Self Care | Payer: Self-pay | Attending: Radiation Oncology | Admitting: Radiation Oncology

## 2020-10-03 LAB — CBC WITH DIFFERENTIAL/PLATELET
Abs Immature Granulocytes: 0.37 10*3/uL — ABNORMAL HIGH (ref 0.00–0.07)
Basophils Absolute: 0 10*3/uL (ref 0.0–0.1)
Basophils Relative: 0 %
Eosinophils Absolute: 0 10*3/uL (ref 0.0–0.5)
Eosinophils Relative: 0 %
HCT: 24.6 % — ABNORMAL LOW (ref 39.0–52.0)
Hemoglobin: 8.3 g/dL — ABNORMAL LOW (ref 13.0–17.0)
Immature Granulocytes: 3 %
Lymphocytes Relative: 4 %
Lymphs Abs: 0.6 10*3/uL — ABNORMAL LOW (ref 0.7–4.0)
MCH: 32 pg (ref 26.0–34.0)
MCHC: 33.7 g/dL (ref 30.0–36.0)
MCV: 95 fL (ref 80.0–100.0)
Monocytes Absolute: 0.9 10*3/uL (ref 0.1–1.0)
Monocytes Relative: 6 %
Neutro Abs: 12.9 10*3/uL — ABNORMAL HIGH (ref 1.7–7.7)
Neutrophils Relative %: 87 %
Platelets: 255 10*3/uL (ref 150–400)
RBC: 2.59 MIL/uL — ABNORMAL LOW (ref 4.22–5.81)
RDW: 14.2 % (ref 11.5–15.5)
WBC: 14.8 10*3/uL — ABNORMAL HIGH (ref 4.0–10.5)
nRBC: 0.3 % — ABNORMAL HIGH (ref 0.0–0.2)

## 2020-10-03 LAB — BASIC METABOLIC PANEL
Anion gap: 9 (ref 5–15)
BUN: 36 mg/dL — ABNORMAL HIGH (ref 6–20)
CO2: 24 mmol/L (ref 22–32)
Calcium: 7.9 mg/dL — ABNORMAL LOW (ref 8.9–10.3)
Chloride: 99 mmol/L (ref 98–111)
Creatinine, Ser: 1.36 mg/dL — ABNORMAL HIGH (ref 0.61–1.24)
GFR, Estimated: >60 mL/min (ref >60)
Glucose, Bld: 118 mg/dL — ABNORMAL HIGH (ref 70–99)
Potassium: 5.1 mmol/L (ref 3.5–5.1)
Sodium: 132 mmol/L — ABNORMAL LOW (ref 135–145)

## 2020-10-03 NOTE — Progress Notes (Signed)
Patient ID: Tyler Barker, male   DOB: 1960/11/21, 59 y.o.   MRN: [id number]  This NP visited patient at the bedside as a follow up for palliative medicine needs and emotional support  Daughter at bedside.      Created space and opportunity for patient and his daughter  to explore their thoughts and feelings regarding patient's current medical situation.  Patient continues to verbalize his frustration with overall situation and plan of care. Daughter expresses appreciation for care and support.  Education offered regarding  the importance of continued conversation with each other and the  medical providers regarding overall plan of care and treatment options,  ensuring decisions are within the context of the patients values and GOCs.  It is important to consider the next steps and transition of care as patient will be completing radiation treatments next week .  Patient needs to process decisions regarding treatment options, and anticipatory care needs.  Education regarding the difference between a medical intervention path and a palliative comfort path was detailed.  PMT will continue to support holistically  Questions and concerns addressed   Discussed with [facility] and Dr Tawanna Solo  Total time spent on the unit was 35 minutes  Greater than 50% of the time was spent in counseling and coordination of care  [name] NP  Palliative Medicine Team Team Phone # [phone] Pager [phone]

## 2020-10-03 NOTE — Progress Notes (Signed)
PROGRESS NOTE    Tyler Barker  QBH:[id number] DOB: 12-Feb-1961 DOA: 09/21/2020 PCP: Patient, No Pcp Per   Chief Complain: Gait problem  Brief Narrative: Patient is a 59 year old man who presented to the emergency department for the evaluation of back pain/lower extremity numbness, difficulty ambulation.  Imagings done in the emergency department showed widespread metastatic disease from lung cancer.  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 He was seen by oncology, radiation oncology, palliative medicine, IR and neurosurgery during this hospitalization.  Hospital course remarkable for new onset GI bleed 11/13, hypotension, AKI.  GI consulted and following.  Patient declined colonoscopy. PT/OT recommended SNF on DC. Plan is to continue ongoing radiation therapy treatment and seeking placement.  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    His hemoglobin dropped, blood pressure dropped requiring IV fluids, PPI.  Nuclear bleeding scan did not show any active bleeding 11/14.  GI recommended colonoscopy but patient declined.  GI signed off.  He denies any abdominal pain.  No further hematochezia. Currently hemoglobin stable in the range of 8.  Due to persistent demand from the patient, regular diet  Started on 10/02/20.  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  Oncology, radiation oncology, neurosurgery were following during this hospitalization.  Plan is for palliative radiation therapy with possible  kyphoplasty at T7.  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  Continue steroid.  Palliative care also following. Radiation oncology recommended to continue radiation treatment to complete 10 treatment courses.  S/p treatment 5/10.  He does not need to stay inhouse for that.  Acute urinary retention: Most likely secondary to brain/spinal mets.  Foley catheter placed.  Leukocytosis: Thought to be from steroids.  stable.  Few scattered subcentimeter acute ischemic nonhemorrhagic infarct: As per MRI.  Case discussed with neurology and recommended to start on aspirin 81 mg.  Aspirin has been stopped due to GI bleed.  Hyperkalemia: Continue monitor,continue with Lokelma.  AKI on CKD stage III: Secondary to dehydration/urinary retention.  CT abdomen/pelvis did not show any obstructive nephropathy.  Continue to monitor kidney function.  Cancer-related pain/nausea: Continue supportive care, pain medications, antiemetics.  Also started on Zyprexa for refractory nausea.  Goals of care: Young patient with newly diagnosed metastatic lung cancer.  Very poor prognosis.  Poor quality of life.  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  Remains full code.  We recommend hospice/palliative approach.  Disposition: PT/OT have recommended skilled nursing facility for placement.  Difficult placement.  Patient is from [name].  He does not have insurance.           DVT prophylaxis:SCD Code Status:  Full Family Communication:daughter  at bedside on 10/02/20 Status is: Inpatient  Remains inpatient appropriate because:Inpatient level of care appropriate due to severity of illness   Dispo: The patient  is from: Home              Anticipated d/c is to: Skilled nursing facility              Anticipated d/c date is: not sure               Patient is medically stable for discharge to skilled nursing facility as soon as bed is available.   Consultants: GI, oncology, radiation oncology, palliative care, neurosurgery  Procedures: Biopsy  Antimicrobials:  Anti-infectives (From admission, onward)   None      Subjective: Patient seen and examined at the bedside this morning.  Continues to complain about minor issues like nursing care/food/disturbance/entry of multiple staff to his room.  Looks comfortable, alert and oriented.  Objective: Vitals:   10/02/20 2000 10/02/20 2057 10/03/20 0050 10/03/20 0522  BP: 134/74 117/68 104/70 105/75  Pulse: 91 98 98 82  Resp: 19 14 20 16   Temp:  98.2 F (36.8 C) 97.9 F (36.6 C) (!) 97.5 F (36.4 C)  TempSrc:  Oral Oral Oral  SpO2: 94% 99% 97% 98%  Weight:  89.8 kg  88.6 kg  Height:  5\' 10"  (1.778 m)      Intake/Output Summary (Last 24 hours) at 10/03/2020 0839 Last data filed at 10/03/2020 0533 Gross per 24 hour  Intake 1039.62 ml  Output 3500 ml  Net -2460.38 ml   Filed Weights   09/29/20 0700 10/02/20 2057 10/03/20 0522  Weight: 85.8 kg 89.8 kg 88.6 kg    Examination:  General exam: Comfortable HEENT:PERRL,Oral mucosa moist, Ear/Nose normal on gross exam Respiratory system: Bilateral equal air entry, normal vesicular breath sounds, no wheezes or crackles  Cardiovascular system: S1 & S2 heard, RRR. No JVD, murmurs, rubs, gallops or clicks. Gastrointestinal system: Abdomen is nondistended, soft and nontender. No organomegaly or masses felt. Normal bowel sounds heard. Central nervous system: Alert and oriented.  Paraplegia  extremities: No edema, no clubbing ,no cyanosis Skin: No rashes, lesions or ulcers,no icterus ,no pallor    Data Reviewed: I have personally reviewed following labs and imaging studies  CBC: Recent Labs    Lab 10-08-20 0800 10-08-20 0800 10-08-20 2138 08-Oct-2020 2138 09/29/20 0705 09/29/20 1243 09/30/20 0100 09/30/20 0625 09/30/20 1915 10/01/20 0043 10/01/20 0746 10/02/20 0250 10/03/20 0607  WBC 14.1*   < > 17.8*  --  19.4*  --  13.6*  --   --   --   --  14.8* 14.8*  NEUTROABS 11.6*  --  14.9*  --   --   --  11.7*  --   --   --   --  13.0* 12.9*  HGB 13.2   < > 11.3*   < > 9.7*   < > 8.0*   < > 9.2* 8.4* 8.1* 7.7* 8.3*  HCT 38.6*   < > 34.2*   < > 28.7*   < > 22.9*   < > 26.8* 25.2* 23.3* 22.1* 24.6*  MCV 96.3   < > 97.7  --  98.0  --  93.5  --   --   --   --  94.0 95.0  PLT 287  --  265  --  245  --   --   --   --   --   --  209 255   < > = values in this interval not displayed.   Basic Metabolic Panel: Recent Labs  Lab 09/29/20 1243 09/30/20 0100 10/01/20 0043 10/02/20 0250 10/03/20 0607  NA 124* 130* 133* 131* 132*  K 5.1 5.4* 5.4* 5.2* 5.1  CL 97* 106 106 103 99  CO2 21* 17* 21* 21* 24  GLUCOSE 122* 112* 96 110* 118*  BUN 77* 59* 54* 45* 36*  CREATININE 2.04* 1.51* 1.62* 1.49* 1.36*  CALCIUM 7.2* 6.8* 7.9* 7.5* 7.9*   GFR: Estimated Creatinine Clearance: 67.1 mL/min (A) (by C-G formula based on SCr of 1.36 mg/dL (H)). Liver Function Tests: No results for input(s): AST, ALT, ALKPHOS, BILITOT, PROT, ALBUMIN in the last 168 hours. No results for input(s): LIPASE, AMYLASE in the last 168 hours. No results for input(s): AMMONIA in the last 168 hours. Coagulation Profile: Recent Labs  Lab 09/29/20 1315  INR 1.1   Cardiac Enzymes: No results for input(s): CKTOTAL, CKMB, CKMBINDEX, TROPONINI in the last 168 hours. BNP (last 3 results) No results for input(s): PROBNP in the last 8760 hours. HbA1C: No results for input(s): HGBA1C in the last 72 hours. CBG: Recent Labs  Lab 09/29/20 2127  GLUCAP 109*   Lipid Profile: No results for input(s): CHOL, HDL, LDLCALC, TRIG, CHOLHDL, LDLDIRECT in the last 72 hours. Thyroid Function Tests: No results for input(s): TSH,  T4TOTAL, FREET4, T3FREE, THYROIDAB in the last 72 hours. Anemia Panel: No results for input(s): VITAMINB12, FOLATE, FERRITIN, TIBC, IRON, RETICCTPCT in the last 72 hours. Sepsis Labs: No results for input(s): PROCALCITON, LATICACIDVEN in the last 168 hours.  Recent Results (from the past 240 hour(s))  MRSA PCR Screening     Status: None   Collection Time: 09/29/20  8:23 PM   Specimen: Nasal Mucosa; Nasopharyngeal  Result Value Ref Range Status   MRSA by PCR NEGATIVE NEGATIVE Final    Comment:        The GeneXpert MRSA Assay (FDA approved for NASAL specimens only), is one component of a comprehensive MRSA colonization surveillance program. It is not intended to diagnose MRSA infection nor to guide or monitor treatment for MRSA infections. Performed at [facility], [place] [address], [place], [place] [address]          Radiology Studies: No results found.      Scheduled Meds: . sodium chloride   Intravenous Once  . sodium chloride   Intravenous Once  . Chlorhexidine Gluconate Cloth  6 each Topical Daily  . dexamethasone (DECADRON) injection  4 mg Intravenous Q12H  . guaiFENesin  600 mg Oral BID  . mouth rinse  15 mL Mouth Rinse BID  . OLANZapine  2.5 mg Oral QHS  . oxyCODONE  15 mg Oral Q12H  . pantoprazole  40 mg Oral BID  . senna-docusate  1 tablet Oral BID  . sodium zirconium cyclosilicate  10 g Oral Daily  . tamsulosin  0.4 mg Oral Daily   Continuous Infusions:    LOS: 11 days    Time spent: 35 mins,.More than 50% of that time was spent in counseling and/or coordination of care.      Shelly Coss, MD [facility] P11/18/2021, 8:39 AM

## 2020-10-04 ENCOUNTER — Ambulatory Visit
Admit: 2020-10-04 | Discharge: 2020-10-04 | Disposition: A | Discharge status: Home / Self Care | Payer: Self-pay | Attending: Radiation Oncology | Admitting: Radiation Oncology

## 2020-10-04 MED ORDER — POLYETHYLENE GLYCOL 3350 17 G PO PACK
17.0000 g | PACK | Freq: Every day | ORAL | Status: DC
  Administered 2020-10-04 – 2020-10-05 (×2): 17 g via ORAL
  Filled 2020-10-04 (×2): qty 1

## 2020-10-04 NOTE — Progress Notes (Signed)
Occupational Therapy Treatment Patient Details Name: Tyler Barker MRN: [id number] DOB: [birth year] Today's Date: 10/04/2020    History of present illness 59 yo male with workup revealing MRI (+) multiple vertebral metastases ,a large R parietal lobe metastatic lesion and multiple smaller enhancing brain metastases; a large hilar and mediastinal mass was partially intersected by the imaging planes MRI were a few scattered subcentimeter acute ischemic nonhemorrhagic infarcts involving the left frontal lobe, right periatrial white matter, and right occipital lobe. PMH hx back injury smoker   OT comments  Significantly limited treatment secondary to patient's frustration, agitation and pain. Attempted to perform core strengthening and stability activity but patient limited by pain. Patient also not comprehending therapeutic process - stating "you people are doing the same thing and getting the same results." Long conversation with patient in attempt to explain therapeutic process but without success. Patient has had no return of motor function and has a poor prognosis. Will continue to follow along for now - if patient continues to have no progress or exhibits continued decreased participation - may need to sign off.   Follow Up Recommendations  SNF;Supervision/Assistance - 24 hour    Equipment Recommendations  Wheelchair (measurements OT);Wheelchair cushion (measurements OT);Hospital bed    Recommendations for Other Services      Precautions / Restrictions Precautions Precautions: Fall Precaution Comments: multiple spinal mets, paraplegia at this point       Mobility Bed Mobility                  Transfers                      Balance Overall balance assessment: Needs assistance   Sitting balance-Leahy Scale: Poor                                     ADL either performed or assessed with clinical judgement   ADL                                                Vision       Perception     Praxis      Cognition Arousal/Alertness: Awake/alert Behavior During Therapy: Agitated Overall Cognitive Status: Within Functional Limits for tasks assessed                                          Exercises Other Exercises Other Exercises: Attempted to perform pseudo "sit up" in long sitting bed using bed rails to assist with pulling up. Patient unable to maintain upright position without UE support. Tolerated approx 15 seconds before needing to lay back due to increased pain.   Shoulder Instructions       General Comments      Pertinent Vitals/ Pain       Pain Assessment: 0-10 Pain Score: 10-Worst pain ever Pain Location: mid back Pain Descriptors / Indicators: Grimacing Pain Intervention(s): Patient requesting pain meds-RN notified  Home Living  Prior Functioning/Environment              Frequency           Progress Toward Goals  OT Goals(current goals can now be found in the care plan section)  Progress towards OT goals: Not progressing toward goals - comment  Acute Rehab OT Goals Patient Stated Goal: to get some answers and to walk and return to normal OT Goal Formulation: With patient Time For Goal Achievement: 10/08/20 Potential to Achieve Goals: Poor  Plan Discharge plan remains appropriate    Co-evaluation          OT goals addressed during session: Strengthening/ROM      AM-PAC OT "6 Clicks" Daily Activity     Outcome Measure   Help from another person eating meals?: A Little Help from another person taking care of personal grooming?: A Little Help from another person toileting, which includes using toliet, bedpan, or urinal?: Total Help from another person bathing (including washing, rinsing, drying)?: A Lot Help from another person to put on and taking off regular upper body clothing?: A  Lot Help from another person to put on and taking off regular lower body clothing?: Total 6 Click Score: 12    End of Session    OT Visit Diagnosis: Unsteadiness on feet (R26.81);Muscle weakness (generalized) (M62.81);Pain   Activity Tolerance Patient limited by pain   Patient Left in bed;with call bell/phone within reach;with family/visitor present   Nurse Communication Patient requests pain meds        Time: 1445-1515 OT Time Calculation (min): 30 min  Charges: OT General Charges $OT Visit: 1 Visit OT Treatments $Therapeutic Activity: 8-22 mins  Tyler Barker, OTR/L [place]  Office [phone] Pager: [place] 10/04/2020, 4:04 PM

## 2020-10-04 NOTE — Progress Notes (Signed)
PROGRESS NOTE    Tyler Barker  SWH:[id number] DOB: 1961-09-01 DOA: 09/21/2020 PCP: Patient, No Pcp Per   Chief Complain: Gait problem  Brief Narrative: Patient is a 59 year old man who presented to the emergency department for the evaluation of back pain/lower extremity numbness, difficulty ambulation.  Imagings done in the emergency department showed widespread metastatic disease from lung cancer.  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 He was seen by oncology, radiation oncology, palliative medicine, IR and neurosurgery during this hospitalization.  Hospital course remarkable for new onset GI bleed 11/13, hypotension, AKI.  GI consulted and following.  Patient declined colonoscopy. PT/OT recommended SNF on DC. Plan is to continue ongoing radiation therapy treatment and seeking placement.  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    His hemoglobin dropped, blood pressure dropped requiring IV fluids, PPI.  Nuclear bleeding scan did not show any active bleeding 11/14.  GI recommended colonoscopy but patient declined.  GI signed off.  He denies any abdominal pain.  No further hematochezia. Currently hemoglobin stable in the range of 8.  Regular diet started on 10/02/20. 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  Oncology, radiation oncology, neurosurgery were following during this hospitalization.  Plan is for palliative radiation therapy with possible  kyphoplasty at T7.  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  Continue steroid.  Palliative care also following. Radiation oncology recommended to continue radiation treatment to complete 10 treatment courses.  S/p treatment 6/10.  He does not need to stay inhouse for that.  Acute urinary retention: Most likely secondary to brain/spinal mets.  Foley catheter placed.  Leukocytosis: Thought to be from steroids.  stable.  Few scattered subcentimeter acute ischemic nonhemorrhagic infarct: As per MRI.  Case discussed with neurology and recommended to start on aspirin 81 mg.  Aspirin has been stopped due to GI bleed.  Hyperkalemia: Continue monitor,continue with Lokelma.  AKI on CKD stage III: Secondary to dehydration/urinary retention.  CT abdomen/pelvis did not show any obstructive nephropathy.  Continue to monitor kidney function.  Cancer-related pain/nausea: Continue supportive care, pain medications, antiemetics.  Also started on Zyprexa for refractory nausea.  Goals of care: Young patient with newly diagnosed metastatic lung cancer.  Very poor prognosis.  Poor quality of life.  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  Remains full code.  We recommend hospice/palliative approach.  Disposition: PT/OT have recommended skilled nursing facility for placement.  Difficult placement.  Patient is from [name].  He does not have insurance.           DVT prophylaxis:SCD Code Status: Full Family  Communication:daughter  at bedside on 10/02/20 Status is: Inpatient  Remains inpatient appropriate because:Inpatient level of care appropriate due to severity of illness   Dispo: The patient  is from: Home              Anticipated d/c is to: Skilled nursing facility              Anticipated d/c date is: not sure               Patient is medically stable for discharge to skilled nursing facility as soon as bed is available.   Consultants: GI, oncology, radiation oncology, palliative care, neurosurgery  Procedures: Biopsy  Antimicrobials:  Anti-infectives (From admission, onward)   None      Subjective: Patient seen and examined at the bedside this morning.  Hemodynamically stable.  Comfortable today.  Denies any abdominal pain.  Has not have a bowel movement since last few days.  Objective: Vitals:   10/03/20 1054 10/03/20 1823 10/03/20 1934 10/04/20 0534  BP: 120/73 122/64 (!) 113/59 124/68  Pulse: 82 80 90 94  Resp: 18  18 18   Temp: 97.7 F (36.5 C) 98.2 F (36.8 C) 98.1 F (36.7 C) (!) 97.5 F (36.4 C)  TempSrc: Oral Oral Oral Oral  SpO2: 100%  97% 100%  Weight:      Height:        Intake/Output Summary (Last 24 hours) at 10/04/2020 0849 Last data filed at 10/04/2020 0549 Gross per 24 hour  Intake 440 ml  Output 3000 ml  Net -2560 ml   Filed Weights   09/29/20 0700 10/02/20 2057 10/03/20 0522  Weight: 85.8 kg 89.8 kg 88.6 kg    Examination:  General exam: Appears calm and comfortable ,Not in distress,average built HEENT:PERRL,Oral mucosa moist, Ear/Nose normal on gross exam Respiratory system: Bilateral equal air entry, normal vesicular breath sounds, no wheezes or crackles  Cardiovascular system: S1 & S2 heard, RRR. No JVD, murmurs, rubs, gallops or clicks. Gastrointestinal system: Abdomen is nondistended, soft and nontender. No organomegaly or masses felt. Normal bowel sounds heard. Central nervous system: Alert and oriented.paraplegia . Extremities: No edema, no clubbing ,no cyanosis Skin: No rashes, lesions or ulcers,no icterus ,no pallor  Data Reviewed: I have personally reviewed following labs and imaging studies  CBC: Recent Labs    Lab 10-12-2020 0800 10/12/20 0800 10-12-20 2138 10/12/2020 2138 09/29/20 0705 09/29/20 1243 09/30/20 0100 09/30/20 0625 09/30/20 1915 10/01/20 0043 10/01/20 0746 10/02/20 0250 10/03/20 0607  WBC 14.1*   < > 17.8*  --  19.4*  --  13.6*  --   --   --   --  14.8* 14.8*  NEUTROABS 11.6*  --  14.9*  --   --   --  11.7*  --   --   --   --  13.0* 12.9*  HGB 13.2   < > 11.3*   < > 9.7*   < > 8.0*   < > 9.2* 8.4* 8.1* 7.7* 8.3*  HCT 38.6*   < > 34.2*   < > 28.7*   < > 22.9*   < > 26.8* 25.2* 23.3* 22.1* 24.6*  MCV 96.3   < > 97.7  --  98.0  --  93.5  --   --   --   --  94.0 95.0  PLT 287  --  265  --  245  --   --   --   --   --   --  209 255   < > = values in this interval not displayed.   Basic Metabolic Panel: Recent Labs  Lab 09/29/20 1243 09/30/20 0100 10/01/20 0043 10/02/20 0250 10/03/20 0607  NA 124* 130* 133* 131* 132*  K 5.1 5.4* 5.4* 5.2* 5.1  CL 97* 106 106 103 99  CO2 21* 17* 21* 21* 24  GLUCOSE 122* 112* 96 110* 118*  BUN 77* 59* 54* 45* 36*  CREATININE 2.04* 1.51* 1.62* 1.49* 1.36*  CALCIUM 7.2* 6.8* 7.9* 7.5* 7.9*   GFR: Estimated Creatinine Clearance: 67.1 mL/min (A) (by C-G formula based on SCr of 1.36 mg/dL (H)). Liver Function Tests: No results for input(s): AST, ALT, ALKPHOS, BILITOT, PROT, ALBUMIN in the last 168 hours. No results for input(s): LIPASE, AMYLASE in the last 168 hours. No results for input(s): AMMONIA in the last 168 hours. Coagulation Profile: Recent Labs  Lab 09/29/20 1315  INR 1.1   Cardiac Enzymes: No results for input(s): CKTOTAL, CKMB, CKMBINDEX, TROPONINI in the last 168 hours. BNP (last 3 results) No results for input(s): PROBNP in the last 8760 hours. HbA1C: No results for input(s): HGBA1C in the last 72 hours. CBG: Recent Labs  Lab 09/29/20 2127  GLUCAP 109*   Lipid Profile: No results for input(s): CHOL, HDL, LDLCALC, TRIG, CHOLHDL, LDLDIRECT in the last 72 hours. Thyroid Function Tests: No results for input(s): TSH,  T4TOTAL, FREET4, T3FREE, THYROIDAB in the last 72 hours. Anemia Panel: No results for input(s): VITAMINB12, FOLATE, FERRITIN, TIBC, IRON, RETICCTPCT in the last 72 hours. Sepsis Labs: No results for input(s): PROCALCITON, LATICACIDVEN in the last 168 hours.  Recent Results (from the past 240 hour(s))  MRSA PCR Screening     Status: None   Collection Time: 09/29/20  8:23 PM   Specimen: Nasal Mucosa; Nasopharyngeal  Result Value Ref Range Status   MRSA by PCR NEGATIVE NEGATIVE Final    Comment:        The GeneXpert MRSA Assay (FDA approved for NASAL specimens only), is one component of a comprehensive MRSA colonization surveillance program. It is not intended to diagnose MRSA infection nor to guide or monitor treatment for MRSA infections. Performed at [facility], [place] [address], [place], [place] [address]          Radiology Studies: No results found.      Scheduled Meds: . sodium chloride   Intravenous Once  . sodium chloride   Intravenous Once  . Chlorhexidine Gluconate Cloth  6 each Topical Daily  . dexamethasone (DECADRON) injection  4 mg Intravenous Q12H  . guaiFENesin  600 mg Oral BID  . mouth rinse  15 mL Mouth Rinse BID  . OLANZapine  2.5 mg Oral QHS  . oxyCODONE  15 mg Oral Q12H  . pantoprazole  40 mg Oral BID  . senna-docusate  1 tablet Oral BID  . sodium zirconium cyclosilicate  10 g Oral Daily  . tamsulosin  0.4 mg Oral Daily   Continuous Infusions:    LOS: 12 days    Time spent: 35 mins,.More than 50% of that time was spent in counseling and/or coordination of care.      Shelly Coss, MD [facility] P11/19/2021, 8:49 AM

## 2020-10-04 NOTE — NC FL2 (Signed)
[place] LEVEL OF CARE SCREENING TOOL     IDENTIFICATION  Patient Name: Tyler Barker Birthdate: 12-26-1960 Sex: male Admission Date (Current Location): 09/21/2020  [facility] and [place] Number:  Herbalist and Address:  [facility],  [place] [address], [place]      Provider Number: [id number]  Attending Physician Name and Address:  Shelly Coss, MD  Relative Name and Phone Number:       Current Level of Care: Hospital Recommended Level of Care: [place] Prior Approval Number:    Date Approved/Denied:   PASRR Number: Pending  Discharge Plan: SNF    Current Diagnoses: Patient Active Problem List   Diagnosis Date Noted  . Anemia due to GI blood loss   . Hematochezia   . Palliative care by specialist   . Cancer associated pain   . DNR (do not resuscitate) discussion   . 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 Metastasis to brain ([place]) 09/23/2020  . Metastatic disease ([place]) 09/22/2020  . Renal insufficiency 09/22/2020  . Current smoker 09/22/2020  . Acute ischemic stroke ([place]) 09/22/2020  . Lung mass 09/22/2020  . Liver lesion 09/22/2020    Orientation RESPIRATION BLADDER Height & Weight     Self, Time, Situation, Place  Normal Indwelling catheter (Neurogenic bladder) Weight: 88.6 kg Height:  5\' 10"  (177.8 cm)  BEHAVIORAL SYMPTOMS/MOOD NEUROLOGICAL BOWEL NUTRITION STATUS      Incontinent Diet (Regular)  AMBULATORY STATUS COMMUNICATION OF NEEDS Skin   Extensive Assist Verbally Bruising                       Personal Care Assistance Level of Assistance  Bathing, Dressing Bathing Assistance: Limited assistance   Dressing Assistance: Limited assistance     Functional Limitations Info             SPECIAL CARE FACTORS FREQUENCY  PT (By licensed PT), OT (By licensed OT)     PT Frequency: 5 x weekly OT Frequency: 5 x weekly            Contractures      Additional Factors  Info  Code Status, Allergies Code Status Info: DNR Allergies Info: None known           Current Medications (10/04/2020):  This is the current hospital active medication list Current Facility-Administered Medications  Medication Dose Route Frequency Provider Last Rate Last Admin  . 0.9 %  sodium chloride infusion (Manually program via Guardrails IV Fluids)   Intravenous Once Neila Gear, NP   Held at 09/29/20 [id number]  . 0.9 %  sodium chloride infusion (Manually program via Guardrails IV Fluids)   Intravenous Once Florencia Reasons, MD   Held at 09/29/20 1313  . acetaminophen (TYLENOL) tablet 650 mg  650 mg Oral Q4H PRN Samuella Cota, MD       Or  . acetaminophen (TYLENOL) 160 MG/5ML solution 650 mg  650 mg Per Tube Q4H PRN Samuella Cota, MD       Or  . acetaminophen (TYLENOL) suppository 650 mg  650 mg Rectal Q4H PRN Samuella Cota, MD      . Chlorhexidine Gluconate Cloth 2 % PADS 6 each  6 each Topical Daily Florencia Reasons, MD   6 each at 10/04/20 0802  . chlorproMAZINE (THORAZINE) tablet 10 mg  10 mg Oral QID PRN Florencia Reasons, MD   10 mg at 10/01/20 1126  . dexamethasone (DECADRON)  injection 4 mg  4 mg Intravenous Q12H Samuella Cota, MD   4 mg at 10/04/20 0802  . guaiFENesin (MUCINEX) 12 hr tablet 600 mg  600 mg Oral BID Florencia Reasons, MD   600 mg at 10/04/20 0801  . HYDROmorphone (DILAUDID) injection 0.5-1 mg  0.5-1 mg Intravenous Q4H PRN Samuella Cota, MD   1 mg at 10/04/20 0756  . LORazepam (ATIVAN) tablet 0.5 mg  0.5 mg Oral Q4H PRN Hayden Pedro, PA-C   0.5 mg at 10/03/20 2212  . MEDLINE mouth rinse  15 mL Mouth Rinse BID Florencia Reasons, MD   15 mL at 10/03/20 1041  . OLANZapine (ZYPREXA) tablet 2.5 mg  2.5 mg Oral QHS Florencia Reasons, MD   2.5 mg at 10/01/20 2208  . ondansetron (ZOFRAN) injection 4 mg  4 mg Intravenous Q6H PRN Samuella Cota, MD   4 mg at 10/04/20 0759  . oxyCODONE (Oxy IR/ROXICODONE) immediate release tablet 5 mg  5 mg Oral Q4H PRN Samuella Cota, MD   5 mg  at 10/03/20 1844  . oxyCODONE (OXYCONTIN) 12 hr tablet 15 mg  15 mg Oral Q12H Florencia Reasons, MD   15 mg at 10/04/20 0801  . pantoprazole (PROTONIX) EC tablet 40 mg  40 mg Oral BID Shelly Coss, MD   40 mg at 10/04/20 0809  . polyethylene glycol (MIRALAX / GLYCOLAX) packet 17 g  17 g Oral Daily Adhikari, Amrit, MD      . senna-docusate (Senokot-S) tablet 1 tablet  1 tablet Oral BID Florencia Reasons, MD   1 tablet at 10/04/20 0801  . sodium zirconium cyclosilicate (LOKELMA) packet 10 g  10 g Oral Daily Shelly Coss, MD   10 g at 10/04/20 0806  . tamsulosin (FLOMAX) capsule 0.4 mg  0.4 mg Oral Daily Florencia Reasons, MD   0.4 mg at 10/04/20 0801     Discharge Medications: Please see discharge summary for a list of discharge medications.  Relevant Imaging Results:  Relevant Lab Results:   Additional Information    Gwenna Fuston, Marjie Skiff, RN

## 2020-10-04 NOTE — TOC Progression Note (Signed)
Transition of Care [facility]) - Progression Note    Patient Details  Name: Tyler Barker MRN: [id number] Date of Birth: 1961/10/19  Transition of [facility]) CM/SW Contact  Omer Puccinelli, Marjie Skiff, RN Phone Number: 10/04/2020, 4:05 PM  Clinical Narrative:     This CM was asked by leadership to see if pt could go to SNF on a LOG and transport back and forth for radiation. [facility] SNF willing to take pt on LOG. Pt and daughter concerned that it is not in pt's best interest medically for pt to go back and forth with his spine mets. MD made aware and will talk to pt and daughter and decide if it is ok for pt to transport. TOC will continue to follow.

## 2020-10-05 LAB — CBC WITH DIFFERENTIAL/PLATELET
Abs Immature Granulocytes: 0.23 10*3/uL — ABNORMAL HIGH (ref 0.00–0.07)
Basophils Absolute: 0 10*3/uL (ref 0.0–0.1)
Basophils Relative: 0 %
Eosinophils Absolute: 0 10*3/uL (ref 0.0–0.5)
Eosinophils Relative: 0 %
HCT: 24.7 % — ABNORMAL LOW (ref 39.0–52.0)
Hemoglobin: 8.2 g/dL — ABNORMAL LOW (ref 13.0–17.0)
Immature Granulocytes: 2 %
Lymphocytes Relative: 3 %
Lymphs Abs: 0.5 10*3/uL — ABNORMAL LOW (ref 0.7–4.0)
MCH: 31.9 pg (ref 26.0–34.0)
MCHC: 33.2 g/dL (ref 30.0–36.0)
MCV: 96.1 fL (ref 80.0–100.0)
Monocytes Absolute: 0.7 10*3/uL (ref 0.1–1.0)
Monocytes Relative: 5 %
Neutro Abs: 14.2 10*3/uL — ABNORMAL HIGH (ref 1.7–7.7)
Neutrophils Relative %: 90 %
Platelets: 252 10*3/uL (ref 150–400)
RBC: 2.57 MIL/uL — ABNORMAL LOW (ref 4.22–5.81)
RDW: 14.6 % (ref 11.5–15.5)
WBC: 15.6 10*3/uL — ABNORMAL HIGH (ref 4.0–10.5)
nRBC: 0.3 % — ABNORMAL HIGH (ref 0.0–0.2)

## 2020-10-05 LAB — BASIC METABOLIC PANEL
Anion gap: 10 (ref 5–15)
BUN: 48 mg/dL — ABNORMAL HIGH (ref 6–20)
CO2: 24 mmol/L (ref 22–32)
Calcium: 7.9 mg/dL — ABNORMAL LOW (ref 8.9–10.3)
Chloride: 101 mmol/L (ref 98–111)
Creatinine, Ser: 1.44 mg/dL — ABNORMAL HIGH (ref 0.61–1.24)
GFR, Estimated: 57 mL/min — ABNORMAL LOW (ref >60)
Glucose, Bld: 118 mg/dL — ABNORMAL HIGH (ref 70–99)
Potassium: 4.8 mmol/L (ref 3.5–5.1)
Sodium: 135 mmol/L (ref 135–145)

## 2020-10-05 MED ORDER — POLYETHYLENE GLYCOL 3350 17 G PO PACK
17.0000 g | PACK | Freq: Two times a day (BID) | ORAL | Status: DC
  Administered 2020-10-05 – 2020-10-10 (×9): 17 g via ORAL
  Filled 2020-10-05 (×11): qty 1

## 2020-10-05 NOTE — Progress Notes (Signed)
PROGRESS NOTE    Tyler Barker  HYI:[id number] DOB: 1961-01-28 DOA: 09/21/2020 PCP: Patient, No Pcp Per   Chief Complain: Gait problem  Brief Narrative: Patient is a 59 year old man who presented to the emergency department for the evaluation of back pain/lower extremity numbness, difficulty ambulation.  Imagings done in the emergency department showed widespread metastatic disease from lung cancer.  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 He was seen by oncology, radiation oncology, palliative medicine, IR and neurosurgery during this hospitalization.  Hospital course remarkable for new onset GI bleed 11/13, hypotension, AKI.  GI consulted and following.  Patient declined colonoscopy. PT/OT recommended SNF on DC. Plan is to continue ongoing radiation therapy treatment and seeking placement.  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    His hemoglobin dropped, blood pressure dropped requiring IV fluids, PPI.  Nuclear bleeding scan did not show any active bleeding 11/14.  GI recommended colonoscopy but patient declined.  GI signed off.  He denies any abdominal pain.  No further hematochezia. Currently hemoglobin stable in the range of 8.  Regular diet started on 10/02/20. 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  Oncology, radiation oncology, neurosurgery were following during this hospitalization.  Plan is for palliative radiation therapy with possible  kyphoplasty at T7.  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  Continue steroid.  Palliative care also following. Radiation oncology recommended to continue radiation treatment to complete 10 treatment courses.  S/p treatment 6/10.  Plan is to continue radiation treatment before discharge.  Acute urinary retention: Most likely secondary to brain/spinal mets.  Foley catheter placed.  Leukocytosis: Thought to be from steroids.  stable.  Few scattered subcentimeter acute ischemic nonhemorrhagic infarct: As per MRI.  Case discussed with neurology and recommended to start on aspirin 81 mg.  Aspirin has been stopped due to GI bleed.  Hyperkalemia: Continue monitor,continue with Lokelma.  AKI on CKD stage III: Secondary to dehydration/urinary retention.  CT abdomen/pelvis did not show any obstructive nephropathy.  Continue to monitor kidney function.  Cancer-related pain/nausea: Continue supportive care, pain medications, antiemetics.  Also started on Zyprexa for refractory nausea.  Goals of care: Young patient with newly diagnosed metastatic lung cancer.  Very poor prognosis.  Poor quality of life.  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  Remains full code.  We recommend hospice/palliative approach.  Disposition: PT/OT have recommended skilled nursing facility for placement.  Difficult placement.  Patient is from [name].  He does not have insurance. Patient and his daughter wants to complete 10 radiation treatments , which will happen on Wednesday, after  that she wants to take her father to [facility] hospital ER in [name] by ambulance.I will update case manager about this plan.            DVT prophylaxis:SCD Code Status: Full Family Communication:daughter on phone 10/05/20 Status is: Inpatient  Remains inpatient appropriate because:Inpatient level of care appropriate due to severity of illness   Dispo: The patient is from: Home              Anticipated d/c is to: Skilled nursing facility              Anticipated d/c date is: On coming wednesday                 Consultants: GI, oncology, radiation oncology, palliative care, neurosurgery  Procedures: Biopsy  Antimicrobials:  Anti-infectives (From admission, onward)   None      Subjective:  Patient seen and examined the bedside this morning.  Hemodynamically stable.  No new complaints.  No bowel movement yet.  He expresses frustration about the plan for discharge to skilled nursing facility.  Objective: Vitals:   10/04/20 0534 10/04/20 1510 10/04/20 2039 10/05/20 0535  BP: 124/68 (!) 150/72 108/70 102/63  Pulse: 94 99 93 (!) 102  Resp: 18 16 18 20   Temp: (!) 97.5 F (36.4 C) 97.8 F (36.6 C) 98.1 F (36.7 C) (!) 97.3 F (36.3 C)  TempSrc: Oral Oral Oral Oral  SpO2: 100% 98%  95%  Weight:      Height:        Intake/Output Summary (Last 24 hours) at 10/05/2020 0903 Last data filed at 10/05/2020 5956 Gross per 24 hour  Intake 240 ml  Output 2350 ml  Net -2110 ml   Filed Weights   09/29/20 0700 10/02/20 2057 10/03/20 0522  Weight: 85.8 kg 89.8 kg 88.6 kg    Examination:  General exam: Not in distress,average built HEENT:PERRL,Oral mucosa moist, Ear/Nose normal on gross exam Respiratory system: Bilateral equal air entry, normal vesicular breath sounds, no wheezes or crackles  Cardiovascular system: S1 & S2 heard, RRR. No JVD, murmurs, rubs, gallops or clicks. Gastrointestinal system: Abdomen is nondistended, soft and nontender. No organomegaly or masses felt. Normal bowel sounds heard. Central nervous system: Alert and oriented.paraplegia Extremities: No edema, no clubbing ,no  cyanosis Skin: No rashes, lesions or ulcers,no icterus ,no pallor   Data Reviewed: I have personally reviewed following labs and imaging studies  CBC: Recent Labs  Lab 09/28/20 2138 09/28/20 2138 09/29/20 0705 09/29/20 1243 09/30/20 0100 09/30/20 0625 10/01/20 0043 10/01/20 0746 10/02/20 0250 10/03/20 0607 10/05/20 0647  WBC 17.8*   < > 19.4*  --  13.6*  --   --   --  14.8* 14.8* 15.6*  NEUTROABS 14.9*  --   --   --  11.7*  --   --   --  13.0* 12.9* 14.2*  HGB 11.3*   < > 9.7*   < > 8.0*   < > 8.4* 8.1* 7.7* 8.3* 8.2*  HCT 34.2*   < > 28.7*   < > 22.9*   < > 25.2* 23.3* 22.1* 24.6* 24.7*  MCV 97.7   < > 98.0  --  93.5  --   --   --  94.0 95.0 96.1  PLT 265  --  245  --   --   --   --   --  209 255 252   < > = values in this interval not displayed.   Basic  Metabolic Panel: Recent Labs  Lab 09/30/20 0100 10/01/20 0043 10/02/20 0250 10/03/20 0607 10/05/20 0647  NA 130* 133* 131* 132* 135  K 5.4* 5.4* 5.2* 5.1 4.8  CL 106 106 103 99 101  CO2 17* 21* 21* 24 24  GLUCOSE 112* 96 110* 118* 118*  BUN 59* 54* 45* 36* 48*  CREATININE 1.51* 1.62* 1.49* 1.36* 1.44*  CALCIUM 6.8* 7.9* 7.5* 7.9* 7.9*   GFR: Estimated Creatinine Clearance: 63.4 mL/min (A) (by C-G formula based on SCr of 1.44 mg/dL (H)). Liver Function Tests: No results for input(s): AST, ALT, ALKPHOS, BILITOT, PROT, ALBUMIN in the last 168 hours. No results for input(s): LIPASE, AMYLASE in the last 168 hours. No results for input(s): AMMONIA in the last 168 hours. Coagulation Profile: Recent Labs  Lab 09/29/20 1315  INR 1.1   Cardiac Enzymes: No results for input(s): CKTOTAL, CKMB, CKMBINDEX, TROPONINI in the last 168 hours. BNP (last 3 results) No results for input(s): PROBNP in the last 8760 hours. HbA1C: No results for input(s): HGBA1C in the last 72 hours. CBG: Recent Labs  Lab 09/29/20 2127  GLUCAP 109*   Lipid Profile: No results for input(s): CHOL, HDL, LDLCALC, TRIG, CHOLHDL, LDLDIRECT in  the last 72 hours. Thyroid Function Tests: No results for input(s): TSH, T4TOTAL, FREET4, T3FREE, THYROIDAB in the last 72 hours. Anemia Panel: No results for input(s): VITAMINB12, FOLATE, FERRITIN, TIBC, IRON, RETICCTPCT in the last 72 hours. Sepsis Labs: No results for input(s): PROCALCITON, LATICACIDVEN in the last 168 hours.  Recent Results (from the past 240 hour(s))  MRSA PCR Screening     Status: None   Collection Time: 09/29/20  8:23 PM   Specimen: Nasal Mucosa; Nasopharyngeal  Result Value Ref Range Status   MRSA by PCR NEGATIVE NEGATIVE Final    Comment:        The GeneXpert MRSA Assay (FDA approved for NASAL specimens only), is one component of a comprehensive MRSA colonization surveillance program. It is not intended to diagnose MRSA infection nor to guide or monitor treatment for MRSA infections. Performed at [facility], [place] [address], [place], [place] [address]          Radiology Studies: No results found.      Scheduled Meds: . Chlorhexidine Gluconate Cloth  6 each Topical Daily  . dexamethasone (DECADRON) injection  4 mg Intravenous Q12H  . guaiFENesin  600 mg Oral BID  . mouth rinse  15 mL Mouth Rinse BID  . OLANZapine  2.5 mg Oral QHS  . oxyCODONE  15 mg Oral Q12H  . pantoprazole  40 mg Oral BID  . polyethylene glycol  17 g Oral Daily  . senna-docusate  1 tablet Oral BID  . tamsulosin  0.4 mg Oral Daily   Continuous Infusions:    LOS: 13 days    Time spent: 35 mins,.More than 50% of that time was spent in counseling and/or coordination of care.      Shelly Coss, MD [facility] P11/20/2021, 9:03 AM

## 2020-10-05 NOTE — Plan of Care (Signed)
  Problem: Education: Goal: Knowledge of General Education information will improve Description: Including pain rating scale, medication(s)/side effects and non-pharmacologic comfort measures Outcome: Progressing   Problem: Clinical Measurements: Goal: Ability to maintain clinical measurements within normal limits will improve Outcome: Progressing   Problem: Clinical Measurements: Goal: Will remain free from infection Outcome: Progressing   Problem: Nutrition: Goal: Adequate nutrition will be maintained Outcome: Progressing   Problem: Education: Goal: Knowledge of disease or condition will improve Outcome: Progressing   Problem: Self-Care: Goal: Verbalization of feelings and concerns over difficulty with self-care will improve Outcome: Progressing

## 2020-10-06 ENCOUNTER — Ambulatory Visit
Admit: 2020-10-06 | Discharge: 2020-10-06 | Disposition: A | Discharge status: Home / Self Care | Payer: Self-pay | Attending: Radiation Oncology | Admitting: Radiation Oncology

## 2020-10-06 MED ORDER — HYDROMORPHONE HCL 1 MG/ML IJ SOLN
0.5000 mg | Freq: Once | INTRAMUSCULAR | Status: AC
  Administered 2020-10-06: 0.5 mg via INTRAVENOUS
  Filled 2020-10-06: qty 0.5

## 2020-10-06 NOTE — Progress Notes (Signed)
Patient ID: Tyler Barker, male   DOB: [birth year], 59 y.o.   MRN: [id number]  This NP visited patient at the bedside as a follow up for palliative medicine needs and emotional support  Patient is more lethargic today, quietly eating his lunch.   Spoke to daughter by phone. She verbalizes  frustration with overall situation and plan of care. She expresses feeling that "my father is being pushed out the door because he doesn't have insurance"    Therapeutic listening and emotional support.  I explained that he had the option of transitioning to a facility here in [place] with a LOG.   She wants him closer to home in [name] and plans to "somehow get him to the [place] in [name]"  Education regarding the difference between a medical intervention path and a palliative comfort path was detailed.   Hospice benefit explored.  PMT will continue to support holistically  Questions and concerns addressed   Discussed with bedside RN  Total time spent on the unit was 20 minutes  Greater than 50% of the time was spent in counseling and coordination of care  [name] NP  Palliative Medicine Team Team Phone # [phone] Pager [phone]

## 2020-10-06 NOTE — Progress Notes (Signed)
Patient continues to refuse education about stroke, stating that he has never had a stroke and that, being a DNR, he doesn't need any further information.   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

## 2020-10-06 NOTE — Progress Notes (Signed)
PROGRESS NOTE    Tyler Barker  KWI:[id number] DOB: 1961/03/17 DOA: 09/21/2020 PCP: Patient, No Pcp Per   Chief Complain: Gait problem  Brief Narrative: Patient is a 59 year old man who presented to the emergency department for the evaluation of back pain/lower extremity numbness, difficulty ambulation.  Imagings done in the emergency department showed widespread metastatic disease from lung cancer.  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 He was seen by oncology, radiation oncology, palliative medicine, IR and neurosurgery during this hospitalization.  Hospital course remarkable for new onset GI bleed 11/13, hypotension, AKI.  GI consulted and following.  Patient declined colonoscopy. PT/OT recommended SNF on DC. Plan is to continue ongoing radiation therapy treatment and seeking placement.  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    His hemoglobin dropped, blood pressure dropped requiring IV fluids, PPI.  Nuclear bleeding scan did not show any active bleeding 11/14.  GI recommended colonoscopy but patient declined.  GI signed off.  He denies any abdominal pain.  No further hematochezia. Currently hemoglobin stable in the range of 8.  Regular diet started on 10/02/20. 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  Oncology, radiation oncology, neurosurgery were following during this hospitalization.  Plan is for palliative radiation therapy with possible  kyphoplasty at T7.  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  Continue steroid.  Palliative care also following. Radiation oncology recommended to continue radiation treatment to complete 10 treatment courses.  Treatment: 7/10 today.  Plan is to continue radiation treatment before discharge.  Acute urinary retention: Most likely secondary to brain/spinal mets.  Foley catheter placed.  Leukocytosis: Thought to be from steroids.  Stable.  Constipation: Continue aggressive bowel regimen.  Currently on MiraLAX twice a day and Senokot  Few scattered subcentimeter acute ischemic nonhemorrhagic infarct: As per MRI.  Case discussed with neurology and recommended to start on aspirin 81 mg.  Aspirin has been stopped due to GI bleed.  Hyperkalemia: Continue monitor,continue with Lokelma.  AKI on CKD stage III: Secondary to dehydration/urinary retention.  CT abdomen/pelvis did not show any obstructive nephropathy.  Continue to monitor kidney function.  Cancer-related pain/nausea: Continue supportive care, pain medications, antiemetics.  Also started on Zyprexa for refractory nausea.  Goals of care: Young patient with newly diagnosed metastatic lung cancer.  Very poor prognosis.  Poor quality of life.  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  Remains full code.  We recommend hospice/palliative approach.  Disposition: PT/OT have recommended skilled nursing facility for placement.  Difficult placement.  Patient is from [name].  He does not have insurance. Patient and  his daughter wants to complete 10 radiation treatments , which will happen on Wednesday, after that she wants to take her father to [facility]  hospital ER in [name] by ambulance.I will update case manager about this plan.           DVT prophylaxis:SCD Code Status: Full Family Communication:daughter on phone 10/05/20 Status is: Inpatient  Remains inpatient appropriate because:Inpatient level of care appropriate due to severity of illness   Dispo: The patient is from: Home              Anticipated d/c is to: Skilled nursing facility              Anticipated d/c date is: On coming wednesday                 Consultants: GI, oncology, radiation oncology, palliative care, neurosurgery  Procedures: Biopsy  Antimicrobials:  Anti-infectives (From admission, onward)   None      Subjective:  Patient seen and examined at the bedside this morning.  Hemodynamically stable.  Denies any new complaints.  Plan for radiation therapy today  Objective: Vitals:   10/05/20 0535 10/05/20 1521 10/05/20 1944 10/06/20 0547  BP: 102/63 108/72 114/75 115/81  Pulse: (!) 102 93 95 96  Resp: 20 16 16 16   Temp: (!) 97.3 F (36.3 C) 97.9 F (36.6 C) 98.5 F (36.9 C) 97.8 F (36.6 C)  TempSrc: Oral Oral Oral Oral  SpO2: 95% 98% 99% 97%  Weight:      Height:        Intake/Output Summary (Last 24 hours) at 10/06/2020 0811 Last data filed at 10/06/2020 0551 Gross per 24 hour  Intake --  Output 2625 ml  Net -2625 ml   Filed Weights   09/29/20 0700 10/02/20 2057 10/03/20 0522  Weight: 85.8 kg 89.8 kg 88.6 kg    Examination:  General exam: Not in distress,average built HEENT:PERRL,Oral mucosa moist, Ear/Nose normal on gross exam Respiratory system: Bilateral equal air entry, normal vesicular breath sounds, no wheezes or crackles  Cardiovascular system: S1 & S2 heard, RRR. No JVD, murmurs, rubs, gallops or clicks. Gastrointestinal system: Abdomen is nondistended, soft and nontender. No organomegaly or masses felt. Normal bowel sounds heard. Central nervous system: Alert and oriented.paraplegia Extremities: No edema, no  clubbing ,no cyanosis Skin: No rashes, lesions or ulcers,no icterus ,no pallor   Data Reviewed: I have personally reviewed following labs and imaging studies  CBC: Recent Labs  Lab 09/30/20 0100 09/30/20 0625 10/01/20 0043 10/01/20 0746 10/02/20 0250 10/03/20 0607 10/05/20 0647  WBC 13.6*  --   --   --  14.8* 14.8* 15.6*  NEUTROABS 11.7*  --   --   --  13.0* 12.9* 14.2*  HGB 8.0*   < > 8.4* 8.1* 7.7* 8.3* 8.2*  HCT 22.9*   < > 25.2* 23.3* 22.1* 24.6* 24.7*  MCV 93.5  --   --   --  94.0 95.0 96.1  PLT  --   --   --   --  209 255 252   < > = values in this interval not displayed.   Basic Metabolic Panel: Recent Labs  Lab 09/30/20 0100 10/01/20 0043 10/02/20 0250 10/03/20 0607 10/05/20 0647  NA 130* 133* 131* 132* 135  K 5.4* 5.4* 5.2* 5.1 4.8  CL 106 106 103 99 101  CO2 17* 21* 21* 24 24  GLUCOSE 112* 96 110* 118* 118*  BUN 59* 54* 45* 36* 48*  CREATININE 1.51* 1.62*  1.49* 1.36* 1.44*  CALCIUM 6.8* 7.9* 7.5* 7.9* 7.9*   GFR: Estimated Creatinine Clearance: 63.4 mL/min (A) (by C-G formula based on SCr of 1.44 mg/dL (H)). Liver Function Tests: No results for input(s): AST, ALT, ALKPHOS, BILITOT, PROT, ALBUMIN in the last 168 hours. No results for input(s): LIPASE, AMYLASE in the last 168 hours. No results for input(s): AMMONIA in the last 168 hours. Coagulation Profile: Recent Labs  Lab 09/29/20 1315  INR 1.1   Cardiac Enzymes: No results for input(s): CKTOTAL, CKMB, CKMBINDEX, TROPONINI in the last 168 hours. BNP (last 3 results) No results for input(s): PROBNP in the last 8760 hours. HbA1C: No results for input(s): HGBA1C in the last 72 hours. CBG: Recent Labs  Lab 09/29/20 2127  GLUCAP 109*   Lipid Profile: No results for input(s): CHOL, HDL, LDLCALC, TRIG, CHOLHDL, LDLDIRECT in the last 72 hours. Thyroid Function Tests: No results for input(s): TSH, T4TOTAL, FREET4, T3FREE, THYROIDAB in the last 72 hours. Anemia Panel: No results for input(s):  VITAMINB12, FOLATE, FERRITIN, TIBC, IRON, RETICCTPCT in the last 72 hours. Sepsis Labs: No results for input(s): PROCALCITON, LATICACIDVEN in the last 168 hours.  Recent Results (from the past 240 hour(s))  MRSA PCR Screening     Status: None   Collection Time: 09/29/20  8:23 PM   Specimen: Nasal Mucosa; Nasopharyngeal  Result Value Ref Range Status   MRSA by PCR NEGATIVE NEGATIVE Final    Comment:        The GeneXpert MRSA Assay (FDA approved for NASAL specimens only), is one component of a comprehensive MRSA colonization surveillance program. It is not intended to diagnose MRSA infection nor to guide or monitor treatment for MRSA infections. Performed at [facility], [place] [address], [place], [place] [address]          Radiology Studies: No results found.      Scheduled Meds: . Chlorhexidine Gluconate Cloth  6 each Topical Daily  . dexamethasone (DECADRON) injection  4 mg Intravenous Q12H  . guaiFENesin  600 mg Oral BID  . mouth rinse  15 mL Mouth Rinse BID  . OLANZapine  2.5 mg Oral QHS  . oxyCODONE  15 mg Oral Q12H  . pantoprazole  40 mg Oral BID  . polyethylene glycol  17 g Oral BID  . senna-docusate  1 tablet Oral BID  . tamsulosin  0.4 mg Oral Daily   Continuous Infusions:    LOS: 14 days    Time spent: 15 mins,.More than 50% of that time was spent in counseling and/or coordination of care.      Shelly Coss, MD [facility] P11/21/2021, 8:11 AM

## 2020-10-07 ENCOUNTER — Ambulatory Visit: Payer: Self-pay

## 2020-10-07 ENCOUNTER — Ambulatory Visit
Admit: 2020-10-07 | Discharge: 2020-10-07 | Disposition: A | Discharge status: Home / Self Care | Payer: Self-pay | Attending: Radiation Oncology | Admitting: Radiation Oncology

## 2020-10-07 LAB — CBC WITH DIFFERENTIAL/PLATELET
Abs Immature Granulocytes: 0.39 10*3/uL — ABNORMAL HIGH (ref 0.00–0.07)
Basophils Absolute: 0.1 10*3/uL (ref 0.0–0.1)
Basophils Relative: 1 %
Eosinophils Absolute: 0 10*3/uL (ref 0.0–0.5)
Eosinophils Relative: 0 %
HCT: 25.1 % — ABNORMAL LOW (ref 39.0–52.0)
Hemoglobin: 8.2 g/dL — ABNORMAL LOW (ref 13.0–17.0)
Immature Granulocytes: 3 %
Lymphocytes Relative: 2 %
Lymphs Abs: 0.3 10*3/uL — ABNORMAL LOW (ref 0.7–4.0)
MCH: 32.3 pg (ref 26.0–34.0)
MCHC: 32.7 g/dL (ref 30.0–36.0)
MCV: 98.8 fL (ref 80.0–100.0)
Monocytes Absolute: 0.7 10*3/uL (ref 0.1–1.0)
Monocytes Relative: 5 %
Neutro Abs: 13 10*3/uL — ABNORMAL HIGH (ref 1.7–7.7)
Neutrophils Relative %: 89 %
Platelets: 212 10*3/uL (ref 150–400)
RBC: 2.54 MIL/uL — ABNORMAL LOW (ref 4.22–5.81)
RDW: 15.6 % — ABNORMAL HIGH (ref 11.5–15.5)
WBC: 14.5 10*3/uL — ABNORMAL HIGH (ref 4.0–10.5)
nRBC: 0.1 % (ref 0.0–0.2)

## 2020-10-07 LAB — BASIC METABOLIC PANEL
Anion gap: 10 (ref 5–15)
BUN: 50 mg/dL — ABNORMAL HIGH (ref 6–20)
CO2: 23 mmol/L (ref 22–32)
Calcium: 8 mg/dL — ABNORMAL LOW (ref 8.9–10.3)
Chloride: 104 mmol/L (ref 98–111)
Creatinine, Ser: 1.47 mg/dL — ABNORMAL HIGH (ref 0.61–1.24)
GFR, Estimated: 55 mL/min — ABNORMAL LOW (ref >60)
Glucose, Bld: 117 mg/dL — ABNORMAL HIGH (ref 70–99)
Potassium: 4.9 mmol/L (ref 3.5–5.1)
Sodium: 137 mmol/L (ref 135–145)

## 2020-10-07 NOTE — TOC Progression Note (Signed)
Transition of Care [facility]) - Progression Note    Patient Details  Name: Tyler Barker MRN: [id number] Date of Birth: 10-14-1961  Transition of Care [facility]) CM/SW Contact  Joaquin Courts, RN Phone Number: 10/07/2020, 3:27 PM  Clinical Narrative:  CM spoke with patient's daughter, Tyler Barker, regarding discharge planning.  Tyler Barker states that she is really hopeful for patient to be able to transfer to the [facility] hospital in [name].  Reports patient was in the [place] in the 1980's but has never established care with the [place].  CM explained that [facility] does require that a patient is established with them to provide care.  Tyler Barker is aware of this and reports that she has reached out to the [place] and submitted paperwork to get her dad established, but that completing that process would require a pcp visit and patient is unable to do this due to current hospitalization and his condition.  Tyler Barker has been speaking with the patient advocate office of the [facility] (main number [phone]).  Advocate office requests call from hospital [facility] team member.  [facility] called [facility] and was forwarded to the transfer office.  [facility] requests clinicals be faxed for review of eligibility.  Requested clinicals were faxed over, however an eligibility review timeline could not be established with representative.  CM will continue to follow-up on eligibility review to see of patient is deemed eligible for VA services and accepted for transfer.  CM updated Tyler Barker on this conversation.  CM spoke with Tyler Barker regarding barriers to the transfer plan.  Tyler Barker reports that she really wishes for patient to be in [name] where he is from and will be close to family.  Tyler Barker has thought about a plan B in the event the transfer is denied.  Tyler Barker has reached out to a private ambulance transportation service called [facility] She has received a quote for private transport and in the event that patient cannot be accepted for transfer,  Tyler Barker plans for patient to discharge back to [name] via this transport service and would like the transport service to take patient to the emergency room of the [facility].  Tyler Barker hopes to get him seen at that emergency room and work with the [facility] team at that hospital to disposition to a facility in [name].  Tyler Barker knows that due to patient not having insurance she will need assistance from the local hospital in [name] with disposition planning.  CM discussed that this TOC team can help with finding a local [facility] area) SNF or hospice facility but has no contacts or relationships in the [place], [place] area that would help with disposition for an uninsured patient.  Tyler Barker is well aware of this and feels that her best option in the event a transfer is denied is to take her dad to the emergency room in [place].  Tyler Barker feels that patient staying in the [place] area is not a good option, feels that the level of care her dad will receive will be better is he is close to family who can check in on him regularly and that is not an option here.   CM explained that [facility] member will reach out to [facility] in am to see if the submitted information for eligibly has been reviewed.        Expected Discharge Plan: Home w Hospice Care Barriers to Discharge: Continued Medical Work up, Inadequate or no insurance  Expected Discharge Plan and Services Expected Discharge Plan: Home w  Hospice Care In-house Referral: Chaplain, Financial Counselor Discharge Planning Services: CM Consult   Living arrangements for the past 2 months: Single Family Home                                       Social Determinants of Health (SDOH) Interventions    Readmission Risk Interventions No flowsheet data found.

## 2020-10-07 NOTE — Progress Notes (Addendum)
Spoke with radiation tech and patient is refusing to have two radiation treatments in one day. Planned for 11/23. Daughter at bedside and in agreement with patient.

## 2020-10-07 NOTE — Progress Notes (Signed)
PT Cancellation Note  Patient Details Name: Tyler Barker MRN: [id number] DOB: 01/06/1961   Cancelled Treatment:    Reason Eval/Treat Not Completed: Other (comment) per RN, patient has had a really bad night and day today- better for PT to hold off for now. Will attempt tomorrow if he is medically appropriate and agreeable.    Windell Norfolk, DPT, PN1   Supplemental Physical Therapist [facility]    Pager [phone] Acute Rehab Office [phone]

## 2020-10-07 NOTE — Progress Notes (Signed)
PROGRESS NOTE    Tyler Barker  EVO:[id number] DOB: 1961/07/23 DOA: 09/21/2020 PCP: Patient, No Pcp Per   Chief Complain: Gait problem  Brief Narrative: Patient is a 59 year old man who presented to the emergency department for the evaluation of back pain/lower extremity numbness, difficulty ambulation.  Imagings done in the emergency department showed widespread metastatic disease from lung cancer.  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 He was seen by oncology, radiation oncology, palliative medicine, IR and neurosurgery during this hospitalization.  Hospital course remarkable for new onset GI bleed 11/13, hypotension, AKI.  GI consulted and following.  Patient declined colonoscopy. PT/OT recommended SNF on DC. Plan is to continue ongoing radiation therapy treatment until placement found.  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    His hemoglobin dropped, blood pressure dropped requiring IV fluids, PPI.  Nuclear bleeding scan did not show any active bleeding 11/14.  GI recommended colonoscopy but patient declined.  GI signed off.  He denies any abdominal pain.  No further hematochezia. Currently hemoglobin stable in the range of 8.  Regular diet started on 10/02/20.  New diagnosis of metastatic non-small cell lung cancer: With mets to brain, spine with pathologic fracture at T7 with possible cord compression.  Oncology, radiation oncology, neurosurgery  were following during this hospitalization.  Plan is for palliative radiation therapy with possible  kyphoplasty at T7.  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  Continue steroid.  Palliative care also following. Radiation oncology recommended to continue radiation treatment to complete 10 treatment courses.  Treatment: 8/10 today.  Plan is to continue radiation treatment before discharge.  Acute urinary retention: Most likely secondary to brain/spinal mets.  Foley catheter placed.  There was concern for urine retention so I have requested for changing the Foley catheter.  Leukocytosis: Thought to be from steroids.  Stable.  Constipation: Continue aggressive bowel regimen.  Currently on MiraLAX twice a day and Senokot.  Complains of lower abdominal discomfort.  We will give him soapsuds enema today  Few scattered subcentimeter acute ischemic nonhemorrhagic infarct: As per MRI.  Case discussed with neurology and recommended to start on aspirin 81 mg.  Aspirin has been stopped due to GI bleed.  Hyperkalemia: Continue monitor,continue with Lokelma.  AKI on CKD stage III: Secondary to dehydration/urinary retention.  CT abdomen/pelvis did not show any obstructive nephropathy.  Continue to monitor kidney function.  Cancer-related pain/nausea: Continue supportive care, pain medications, antiemetics.  Also started on Zyprexa for refractory nausea.  Goals of care: Young patient with newly diagnosed metastatic lung cancer.  Very poor prognosis.  Poor quality of life.  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  Remains full code.  We recommend hospice/palliative approach.  Disposition: PT/OT have recommended skilled nursing  facility for placement.  Difficult placement.  Patient is from [name].  He does not have insurance. Patient and his daughter wants to complete 10  radiation treatments , S/P 7/10, after that she wants to take her father to [facility] hospital ER in [name] by ambulance.I will update case manager about this plan.           DVT prophylaxis:SCD Code Status: Full Family Communication:daughter on phone 10/05/20 Status is: Inpatient  Remains inpatient appropriate because:Inpatient level of care appropriate due to severity of illness   Dispo: The patient is from: Home              Anticipated d/c is to: Unknown              Anticipated d/c date is: On coming wednesday                 Consultants: GI, oncology, radiation oncology, palliative care, neurosurgery  Procedures: Biopsy  Antimicrobials:  Anti-infectives (From admission, onward)   None      Subjective:  Patient seen and examined at the bedside this morning.  Looks uncomfortable today.Complains of lower abdominal discomfort.  No hematochezia or melena.  He does not have a bowel movement since last several days.  Giving enema and also changing the Foley catheter.  Objective: Vitals:   10/06/20 0547 10/06/20 1350 10/06/20 2059 10/07/20 0514  BP: 115/81 118/84 102/78 110/68  Pulse: 96 (!) 110 100 100  Resp: 16 20 16 16   Temp: 97.8 F (36.6 C) 98.2 F (36.8 C) 97.8 F (36.6 C) 97.8 F (36.6 C)  TempSrc: Oral Oral Oral Oral  SpO2: 97% 99% 97% 95%  Weight:      Height:        Intake/Output Summary (Last 24 hours) at 10/07/2020 0812 Last data filed at 10/07/2020 0523 Gross per 24 hour  Intake 240 ml  Output 1650 ml  Net -1410 ml   Filed Weights   09/29/20 0700 10/02/20 2057 10/03/20 0522  Weight: 85.8 kg 89.8 kg 88.6 kg    Examination:  General exam: Chronically ill looking, weak, uncomfortable Respiratory system: Bilateral equal air entry, normal vesicular breath sounds, no wheezes or crackles  Cardiovascular system: S1 & S2 heard, RRR. No JVD, murmurs, rubs, gallops or clicks. Gastrointestinal system: Abdomen is nondistended, soft and nontender. No  organomegaly or masses felt. Normal bowel sounds heard. Central nervous system: Alert and oriented.  Paraplegia Extremities: No edema, no clubbing ,no cyanosis Skin: No rashes, lesions or ulcers,no icterus ,no pallor    Data Reviewed: I have personally reviewed following labs and imaging studies  CBC: Recent Labs  Lab 10/01/20 0746 10/02/20 0250 10/03/20 0607 10/05/20 0647 10/07/20 0549  WBC  --  14.8* 14.8* 15.6* 14.5*  NEUTROABS  --  13.0* 12.9* 14.2* 13.0*  HGB 8.1* 7.7* 8.3* 8.2* 8.2*  HCT 23.3* 22.1* 24.6* 24.7* 25.1*  MCV  --  94.0 95.0 96.1 98.8  PLT  --  209 255 252 024   Basic Metabolic Panel: Recent Labs  Lab 10/01/20 0043 10/02/20 0250 10/03/20 0607 10/05/20 0647 10/07/20 0549  NA 133* 131* 132* 135 137  K 5.4* 5.2* 5.1 4.8 4.9  CL 106 103 99 101 104  CO2 21* 21* 24 24 23   GLUCOSE 96 110* 118* 118* 117*  BUN 54* 45* 36* 48* 50*  CREATININE 1.62* 1.49* 1.36* 1.44* 1.47*  CALCIUM 7.9* 7.5* 7.9* 7.9* 8.0*   GFR: Estimated Creatinine Clearance: 62.1 mL/min (A) (by C-G  formula based on SCr of 1.47 mg/dL (H)). Liver Function Tests: No results for input(s): AST, ALT, ALKPHOS, BILITOT, PROT, ALBUMIN in the last 168 hours. No results for input(s): LIPASE, AMYLASE in the last 168 hours. No results for input(s): AMMONIA in the last 168 hours. Coagulation Profile: No results for input(s): INR, PROTIME in the last 168 hours. Cardiac Enzymes: No results for input(s): CKTOTAL, CKMB, CKMBINDEX, TROPONINI in the last 168 hours. BNP (last 3 results) No results for input(s): PROBNP in the last 8760 hours. HbA1C: No results for input(s): HGBA1C in the last 72 hours. CBG: No results for input(s): GLUCAP in the last 168 hours. Lipid Profile: No results for input(s): CHOL, HDL, LDLCALC, TRIG, CHOLHDL, LDLDIRECT in the last 72 hours. Thyroid Function Tests: No results for input(s): TSH, T4TOTAL, FREET4, T3FREE, THYROIDAB in the last 72 hours. Anemia Panel: No results  for input(s): VITAMINB12, FOLATE, FERRITIN, TIBC, IRON, RETICCTPCT in the last 72 hours. Sepsis Labs: No results for input(s): PROCALCITON, LATICACIDVEN in the last 168 hours.  Recent Results (from the past 240 hour(s))  MRSA PCR Screening     Status: None   Collection Time: 09/29/20  8:23 PM   Specimen: Nasal Mucosa; Nasopharyngeal  Result Value Ref Range Status   MRSA by PCR NEGATIVE NEGATIVE Final    Comment:        The GeneXpert MRSA Assay (FDA approved for NASAL specimens only), is one component of a comprehensive MRSA colonization surveillance program. It is not intended to diagnose MRSA infection nor to guide or monitor treatment for MRSA infections. Performed at [facility], [place] [address] [place],  [address]          Radiology Studies: No results found.      Scheduled Meds: . Chlorhexidine Gluconate Cloth  6 each Topical Daily  . dexamethasone (DECADRON) injection  4 mg Intravenous Q12H  . guaiFENesin  600 mg Oral BID  . mouth rinse  15 mL Mouth Rinse BID  . OLANZapine  2.5 mg Oral QHS  . oxyCODONE  15 mg Oral Q12H  . pantoprazole  40 mg Oral BID  . polyethylene glycol  17 g Oral BID  . senna-docusate  1 tablet Oral BID  . tamsulosin  0.4 mg Oral Daily   Continuous Infusions:    LOS: 15 days    Time spent: 15 mins,.More than 50% of that time was spent in counseling and/or coordination of care.      Shelly Coss, MD [facility] P11/22/2021, 8:12 AM

## 2020-10-08 ENCOUNTER — Ambulatory Visit
Admit: 2020-10-08 | Discharge: 2020-10-08 | Disposition: A | Discharge status: Home / Self Care | Payer: Self-pay | Attending: Radiation Oncology | Admitting: Radiation Oncology

## 2020-10-08 ENCOUNTER — Ambulatory Visit: Payer: Self-pay

## 2020-10-08 LAB — RESP PANEL BY RT-PCR (FLU A&B, COVID) ARPGX2
Influenza A by PCR: NEGATIVE
Influenza B by PCR: NEGATIVE
SARS Coronavirus 2 by RT PCR: NEGATIVE

## 2020-10-08 NOTE — Progress Notes (Signed)
Chaplain engaged in follow-up visit with Tyler Barker and his daughter Tyler Barker.  Tyler Barker was frustrated because she needed documents signed to go to her job towards [employer] for being here in [place] and supporting her father.  Tyler Barker lives in [name].  Tyler Barker shared how she had given the paperwork to her dad's doctor but had not received it back and was unsure if it was going to be signed.  Chaplain encouraged Tyler Barker to reach out to doctor through nurse or to reach out to social work.  Chaplain affirmed [name]' change in countenance and appearance from when she first saw him in the ICU.  [name] shared that he knows that others have been praying for him.  He discussed the pain he has been in and the journey he has been on.    Chaplain offered listening and presence.      10/08/20 1200  Clinical Encounter Type  Visited With Patient and family together  Visit Type Follow-up;Social support

## 2020-10-08 NOTE — Progress Notes (Signed)
Occupational Therapy Treatment Patient Details Name: Tyler Barker MRN: [id number] DOB: 07-30-1961 Today's Date: 10/08/2020    History of present illness 59 yo male with workup revealing MRI (+) multiple vertebral metastases ,a large R parietal lobe metastatic lesion and multiple smaller enhancing brain metastases; a large hilar and mediastinal mass was partially intersected by the imaging planes MRI were a few scattered subcentimeter acute ischemic nonhemorrhagic infarcts involving the left frontal lobe, right periatrial white matter, and right occipital lobe. PMH hx back injury smoker   OT comments  Pt seen together with PT. Pt declined trying to sit EOB "why should I try that if they aren't going to fix my back?" Agreeable only to bed level activity in supine including BLE exercises. Issued leg lifter and reacher. Provided bed level UB/LB exercises. Decreased OT frequency to 1x/week as pt states he does not currently plan to work on progressing bed level mobility/transfers. D/c plan remains appropriate.    Follow Up Recommendations  SNF;Supervision/Assistance - 24 hour    Equipment Recommendations  Wheelchair (measurements OT);Wheelchair cushion (measurements OT);Hospital bed    Recommendations for Other Services      Precautions / Restrictions Precautions Precautions: Fall Precaution Comments: multiple spinal mets, paraplegia at this point Restrictions Weight Bearing Restrictions: No       Mobility Bed Mobility               General bed mobility comments: pt able to use BUE on rails to reposition a bit in bed. declined mobilizing at bed level.   Transfers                 General transfer comment: NT    Balance                                           ADL either performed or assessed with clinical judgement   ADL Overall ADL's : Needs assistance/impaired                                       General ADL Comments:  Difficult to redirect. Agreeable to bed level activity as he feels encouraged by sensation and some active movement in BLE today. Provided leg raiser and demonstrated technique for exercises and functional use. Also provided reacher. Daughter present for last part of session.      Vision   Additional Comments: appears grossly intact, difficult to fully assess 2/2 behaviors/cognition.    Perception     Praxis      Cognition Arousal/Alertness: Awake/alert Behavior During Therapy: Flat affect;Agitated;Anxious (tearful at times discussing his condition) Overall Cognitive Status: Impaired/Different from baseline Area of Impairment: Attention;Awareness;Problem solving;Memory;Following commands;Safety/judgement                   Current Attention Level: Sustained Memory: Decreased short-term memory;Decreased recall of precautions Following Commands: Follows one step commands inconsistently Safety/Judgement: Decreased awareness of safety;Decreased awareness of deficits Awareness: Emergent Problem Solving: Slow processing;Difficulty sequencing General Comments: Tangential and verbally perseverates. Pt expressing distrust in staff "those people", "this place." Also expressing feelings of hopelessness "what's the point of trying to sit on the side of bed if they aren't going to do anything to fix my back." Pt ranging in feelings of anger, frustration, sadness during session. "I didn't mean to point at  you, I'm sorry."        Exercises     Shoulder Instructions       General Comments      Pertinent Vitals/ Pain       Pain Assessment: Faces Faces Pain Scale: Hurts even more Pain Location: spoke of R>LLE pain at times today. Pain Descriptors / Indicators: Sore Pain Intervention(s): Monitored during session;Premedicated before session  Home Living                                          Prior Functioning/Environment              Frequency  Min 1X/week         Progress Toward Goals  OT Goals(current goals can now be found in the care plan section)  Progress towards OT goals: Progressing toward goals (limited progress)  Acute Rehab OT Goals Patient Stated Goal: to get some answers and to walk and return to normal OT Goal Formulation: With patient Time For Goal Achievement: 10/22/20 Potential to Achieve Goals: Poor ADL Goals Pt Will Perform Upper Body Bathing: with supervision;sitting Pt Will Perform Lower Body Bathing: with min guard assist;bed level Additional ADL Goal #1: Pt will complete sliding board transfer to drop arm commode supervision level Additional ADL Goal #2: Pt will complete bed mobility supervision with use of bed rails  Plan Discharge plan remains appropriate;Frequency needs to be updated    Co-evaluation    PT/OT/SLP Co-Evaluation/Treatment: Yes Reason for Co-Treatment: Necessary to address cognition/behavior during functional activity;For patient/therapist safety;To address functional/ADL transfers   OT goals addressed during session: ADL's and self-care;Proper use of Adaptive equipment and DME;Strengthening/ROM      AM-PAC OT "6 Clicks" Daily Activity     Outcome Measure   Help from another person eating meals?: A Little Help from another person taking care of personal grooming?: A Little Help from another person toileting, which includes using toliet, bedpan, or urinal?: Total Help from another person bathing (including washing, rinsing, drying)?: A Lot Help from another person to put on and taking off regular upper body clothing?: A Lot Help from another person to put on and taking off regular lower body clothing?: Total 6 Click Score: 12    End of Session    OT Visit Diagnosis: Unsteadiness on feet (R26.81);Muscle weakness (generalized) (M62.81);Pain   Activity Tolerance Patient tolerated treatment well   Patient Left in bed;with call bell/phone within reach;with family/visitor present;with  bed alarm set   Nurse Communication          Time: 5697-9480 OT Time Calculation (min): 31 min  Charges: OT General Charges $OT Visit: 1 Visit OT Treatments $Therapeutic Activity: 8-22 mins  Tyrone Schimke, OT Acute Rehabilitation Services Pager: [phone] Office: [phone]    Hortencia Pilar 10/08/2020, 2:08 PM

## 2020-10-08 NOTE — TOC Progression Note (Signed)
Transition of Care [facility]) - Progression Note    Patient Details  Name: Tyler Barker MRN: [id number] Date of Birth: 1961-09-21  Transition of Care  [facility]) CM/SW Contact  Joaquin Courts, RN Phone Number: 10/08/2020, 3:20 PM  Clinical Narrative:    CM spoke with [facility] in [place]. Per eligibility office, patient is eligible to receive VA benefits and would only be responsible for out of pocket medication costs with the rest of his care covered by VA benefits.  CM spoke with transfer coordinator who reports that [facility] hospital does not have capacity to provide radiation oncology services and so would not be able to accept the transfer at this time.  CM spoke with patient's daughter Tyler Barker and notified her of this.  Tyler Barker would like to go to plan B and hire a private ambulance transport company and take patient to [facility] and take him to the emergency department at the [facility].  Daughter hopes this will help her with finding disposition in the [place] area that will be covered by [place] benefits.    Tyler Barker reports she has spoken with the private transport company ([facility]) and the earliest transport available is on Saturday morning.  Tyler Barker is prepared to sign the transport contract.     [facility] contacted CM and reported transport is set for Saturday 11/27 at 8 am.  They request that all necessary medication for patient be available at time of transport.  MD notified that prescriptions will need to be written and provided to daughter on Friday and Tyler Barker will pick up everything from pharmacy and have ready for transport on Saturday.  Copy of facesheet and MAR also faxed to [facility] [phone]).  Transport services also requests an updated Covid test within 7 days of transport.  MD notified for order and results will need to be faxed to [facility] [phone]).    CM spoke with daughter and patient and updated them on this conversation.  Daughter and patient are in agreement with  plan for Saturday discharge and transport.    Expected Discharge Plan: Home/Self Care Barriers to Discharge: Continued Medical Work up, Inadequate or no insurance  Expected Discharge Plan and Services Expected Discharge Plan: Home/Self Care In-house Referral: Chaplain, Museum/gallery curator Counselor Discharge Planning Services: CM Consult   Living arrangements for the past 2 months: Single Family Home                                       Social Determinants of Health (SDOH) Interventions    Readmission Risk Interventions No flowsheet data found.

## 2020-10-08 NOTE — Progress Notes (Signed)
Physical Therapy Treatment Patient Details Name: Tyler Barker MRN: [id number] DOB: 02/20/1961 Today's Date: 10/08/2020    History of Present Illness 59 yo male with workup revealing MRI (+) multiple vertebral metastases ,a large R parietal lobe metastatic lesion and multiple smaller enhancing brain metastases; a large hilar and mediastinal mass was partially intersected by the imaging planes MRI were a few scattered subcentimeter acute ischemic nonhemorrhagic infarcts involving the left frontal lobe, right periatrial white matter, and right occipital lobe. PMH hx back injury smoker    PT Comments    Patient expressing that he has felt pain in the  Right thigh. Patient has no active movement of andkle kne, is able to internally rotate the leg. On the left, patient  initiates hip extension and flexion and  Noted with trance plantar and dorsiflexion.  Noted Internal rotation of left hip.  Patient declined further  Mobility. Patient is sitting upright in bed with HOB and appears to be shifting trunk left and right more tolerable than when PT last worked with patient. Continue to follow for therapy tolerance. Given leg lifter to stretch ankles and attemt to move the legs on bed.   Follow Up Recommendations  SNF;Supervision/Assistance - 24 hour     Equipment Recommendations  None recommended by PT    Recommendations for Other Services       Precautions / Restrictions Precautions Precautions: Fall Precaution Comments: multiple spinal mets, paraplegia at this point Restrictions Weight Bearing Restrictions: No    Mobility  Bed Mobility               General bed mobility comments: pt able to use BUE on rails to reposition a bit in bed. declined mobilizing at bed level.   Transfers                 General transfer comment: NT  Ambulation/Gait                 Stairs             Wheelchair Mobility    Modified Rankin (Stroke Patients Only)        Balance                                            Cognition Arousal/Alertness: Awake/alert Behavior During Therapy: Flat affect;Agitated;Anxious (tearful at times discussing his condition) Overall Cognitive Status: Impaired/Different from baseline Area of Impairment: Attention;Awareness;Problem solving;Memory;Following commands;Safety/judgement                   Current Attention Level: Sustained Memory: Decreased short-term memory;Decreased recall of precautions Following Commands: Follows one step commands inconsistently Safety/Judgement: Decreased awareness of safety;Decreased awareness of deficits Awareness: Emergent Problem Solving: Slow processing;Difficulty sequencing General Comments: Tangential and verbally perseverates. Pt expressing distrust in staff "those people", "this place." Also expressing feelings of hopelessness "what's the point of trying to sit on the side of bed if they aren't going to do anything to fix my back." Pt ranging in feelings of anger, frustration, sadness during session. "I didn't mean to point at you, I'm sorry."      Exercises      General Comments        Pertinent Vitals/Pain Pain Assessment: Faces Faces Pain Scale: Hurts even more Pain Location: spoke of R>LLE pain at times today. Pain Descriptors / Indicators: Sore Pain Intervention(s): Monitored during session;Premedicated  before session    Home Living                      Prior Function            PT Goals (current goals can now be found in the care plan section) Acute Rehab PT Goals Patient Stated Goal: to get some answers and to walk and return to normal Progress towards PT goals: Progressing toward goals    Frequency    Min 2X/week      PT Plan Current plan remains appropriate;Frequency needs to be updated    Co-evaluation   Reason for Co-Treatment: Necessary to address cognition/behavior during functional activity;For  patient/therapist safety;To address functional/ADL transfers   OT goals addressed during session: ADL's and self-care;Proper use of Adaptive equipment and DME;Strengthening/ROM      AM-PAC PT "6 Clicks" Mobility   Outcome Measure  Help needed turning from your back to your side while in a flat bed without using bedrails?: Total Help needed moving from lying on your back to sitting on the side of a flat bed without using bedrails?: Total Help needed moving to and from a bed to a chair (including a wheelchair)?: Total Help needed standing up from a chair using your arms (e.g., wheelchair or bedside chair)?: Total Help needed to walk in hospital room?: Total Help needed climbing 3-5 steps with a railing? : Total 6 Click Score: 6    End of Session   Activity Tolerance: Patient tolerated treatment well Patient left: in bed;with call bell/phone within reach;with bed alarm set;with family/visitor present Nurse Communication: Mobility status;Need for lift equipment PT Visit Diagnosis: Muscle weakness (generalized) (M62.81);Other abnormalities of gait and mobility (R26.89)     Time: 5701-7793 PT Time Calculation (min) (ACUTE ONLY): 31 min  Charges:  $Therapeutic Exercise: 8-22 mins                     Tyler Barker PT Acute Rehabilitation Services Pager [phone] Office [phone]     Tyler Barker 10/08/2020, 2:35 PM

## 2020-10-08 NOTE — Progress Notes (Signed)
PROGRESS NOTE    Tyler Barker  KVQ:[id number] DOB: 1961-07-26 DOA: 09/21/2020 PCP: Patient, No Pcp Per   Chief Complain: Gait problem  Brief Narrative: Patient is a 59 year old man who presented to the emergency department for the evaluation of back pain/lower extremity numbness, difficulty ambulation.  Imagings done in the emergency department showed widespread metastatic disease from lung cancer.  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 He was seen by oncology, radiation oncology, palliative medicine, IR and neurosurgery during this hospitalization.  Hospital course remarkable for new onset GI bleed 11/13, hypotension, AKI.  GI consulted and following.  Patient declined colonoscopy. PT/OT recommended SNF on DC. Plan is to continue ongoing radiation therapy treatment here and the daughter is planning to take her to [facility] ER by ambulance after finishing the course.  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    His hemoglobin dropped, blood pressure dropped requiring IV fluids, PPI.  Nuclear bleeding scan did not show any active bleeding 11/14.  GI recommended colonoscopy but patient declined.  GI signed off.  He denies any abdominal pain.  No further hematochezia. Currently hemoglobin stable in the range of 8.  Regular diet started on 10/02/20.  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  Oncology, radiation oncology, neurosurgery were following during this hospitalization. He has been started on  palliative radiation therapy . He might be a candidate for  possible  kyphoplasty at T7 in future( but not on this hospitalization).    Continue steroid IV,will change to oral on DC.  Palliative care was also following. Radiation oncology recommended to continue radiation treatment to complete 10-12 treatment courses.  Treatment: 8/10 today.  Plan is to continue radiation treatment before discharge.  Acute urinary retention: Most likely secondary to brain/spinal mets.  Foley catheter placed.  Continue Foley catheter on discharge  Leukocytosis: Thought to be from steroids.  Stable.  Constipation: Continue aggressive bowel regimen.  Currently on MiraLAX twice a day and Senokot.   He denies taking enema.  No complaint of abdominal pain or discomfort today.  Few scattered subcentimeter acute ischemic nonhemorrhagic infarct: As per MRI.  Case discussed with neurology and recommended to start on aspirin 81 mg.  Aspirin has been stopped due to GI bleed.  Hyperkalemia: resolved treated with Lokelma  AKI on CKD stage III: Secondary to dehydration/urinary retention.  CT abdomen/pelvis did not show any obstructive nephropathy.  Continue to monitor kidney function.  Cancer-related pain/nausea: Continue supportive care, pain medications, antiemetics.  Also started on Zyprexa for refractory nausea.  Goals of care: relatively young patient with newly diagnosed metastatic lung cancer.  Very poor prognosis.  Poor quality of life.  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 We recommend hospice/palliative approach.  Disposition:  PT/OT have recommended skilled nursing facility for placement.  Difficult placement.  Patient is from [name].  He does not have insurance. Patient and his daughter wants to complete  radiation treatments  and after that she wants to take her father to [facility] hospital ER in [name] by ambulance, case manager updated  about this plan.radiation oncology planned to accelerate radiation treatment by doing 2 sessions a day but patient apparently declined.            DVT prophylaxis:SCD Code Status: Full Family Communication:daughter at bedside on 10/08/20 Status is: Inpatient  Remains inpatient appropriate because:Inpatient level of care appropriate due to severity of illness   Dispo: The patient is from: Home              Anticipated d/c is to: Daughter will take him to [place] ER in [name] by ambulance              Anticipated d/c date is:       As soon as he completes radiation treatment and cleared by radiation oncology           Consultants: GI, oncology, radiation oncology, palliative care, neurosurgery  Procedures: Biopsy  Antimicrobials:  Anti-infectives (From admission, onward)   None      Subjective:  Patient seen and examined at the bedside this morning.  He appears more comfortable today.  Denies any lower abdominal pain like yesterday.  He continues to complain of all the staff taking care of him.  Daughter was at the bedside  Objective: Vitals:   10/07/20 0945 10/07/20 1358 10/07/20 2216 10/08/20 0455  BP: 133/79 96/69 101/63 108/68  Pulse: 88 98 (!) 110 94  Resp: 19 19 16 14   Temp: 97.6 F (36.4 C) 98.2 F (36.8 C) 98.6 F (37 C) 98 F (36.7 C)  TempSrc: Oral Oral Oral Oral  SpO2: 100% 98% 95% 99%  Weight:      Height:        Intake/Output Summary (Last 24 hours) at 10/08/2020 1610 Last data filed at 10/08/2020 0602 Gross per 24 hour  Intake 360 ml  Output 2100 ml  Net -1740 ml   Filed Weights   09/29/20 0700 10/02/20 2057 10/03/20 0522  Weight: 85.8 kg 89.8 kg 88.6 kg    Examination:  General exam: Chronically looking, weak Respiratory system: Bilateral equal air entry, normal vesicular breath sounds, no wheezes or crackles  Cardiovascular  system: S1 & S2 heard, RRR. No JVD, murmurs, rubs, gallops or clicks. Gastrointestinal system: Abdomen is nondistended, soft and nontender. No organomegaly or masses felt. Normal bowel sounds heard. Central nervous system: Alert and oriented.  Paraplegia. Extremities: No edema, no clubbing ,no cyanosis, Skin: No rashes, lesions or ulcers,no icterus ,no pallor GU: Foley      Data Reviewed: I have personally reviewed following labs and imaging studies  CBC: Recent Labs  Lab 10/02/20 0250 10/03/20 0607 10/05/20 0647 10/07/20 0549  WBC 14.8* 14.8* 15.6* 14.5*  NEUTROABS 13.0* 12.9* 14.2* 13.0*  HGB 7.7* 8.3* 8.2* 8.2*  HCT 22.1* 24.6* 24.7* 25.1*  MCV 94.0 95.0 96.1 98.8  PLT 209 255 252 960   Basic Metabolic Panel: Recent Labs  Lab 10/02/20 0250 10/03/20 0607 10/05/20 0647 10/07/20 0549  NA 131* 132* 135 137  K 5.2* 5.1 4.8 4.9  CL 103 99 101 104  CO2 21* 24 24 23   GLUCOSE 110* 118* 118* 117*  BUN 45* 36* 48* 50*  CREATININE 1.49* 1.36* 1.44* 1.47*  CALCIUM  7.5* 7.9* 7.9* 8.0*   GFR: Estimated Creatinine Clearance: 62.1 mL/min (A) (by C-G formula based on SCr of 1.47 mg/dL (H)). Liver Function Tests: No results for input(s): AST, ALT, ALKPHOS, BILITOT, PROT, ALBUMIN in the last 168 hours. No results for input(s): LIPASE, AMYLASE in the last 168 hours. No results for input(s): AMMONIA in the last 168 hours. Coagulation Profile: No results for input(s): INR, PROTIME in the last 168 hours. Cardiac Enzymes: No results for input(s): CKTOTAL, CKMB, CKMBINDEX, TROPONINI in the last 168 hours. BNP (last 3 results) No results for input(s): PROBNP in the last 8760 hours. HbA1C: No results for input(s): HGBA1C in the last 72 hours. CBG: No results for input(s): GLUCAP in the last 168 hours. Lipid Profile: No results for input(s): CHOL, HDL, LDLCALC, TRIG, CHOLHDL, LDLDIRECT in the last 72 hours. Thyroid Function Tests: No results for input(s): TSH, T4TOTAL, FREET4,  T3FREE, THYROIDAB in the last 72 hours. Anemia Panel: No results for input(s): VITAMINB12, FOLATE, FERRITIN, TIBC, IRON, RETICCTPCT in the last 72 hours. Sepsis Labs: No results for input(s): PROCALCITON, LATICACIDVEN in the last 168 hours.  Recent Results (from the past 240 hour(s))  MRSA PCR Screening     Status: None   Collection Time: 09/29/20  8:23 PM   Specimen: Nasal Mucosa; Nasopharyngeal  Result Value Ref Range Status   MRSA by PCR NEGATIVE NEGATIVE Final    Comment:        The GeneXpert MRSA Assay (FDA approved for NASAL specimens only), is one component of a comprehensive MRSA colonization surveillance program. It is not intended to diagnose MRSA infection nor to guide or monitor treatment for MRSA infections. Performed at [facility], [place] [address] [place], [place] [address]          Radiology Studies: No results found.      Scheduled Meds: . Chlorhexidine Gluconate Cloth  6 each Topical Daily  . dexamethasone (DECADRON) injection  4 mg Intravenous Q12H  . guaiFENesin  600 mg Oral BID  . mouth rinse  15 mL Mouth Rinse BID  . OLANZapine  2.5 mg Oral QHS  . pantoprazole  40 mg Oral BID  . polyethylene glycol  17 g Oral BID  . senna-docusate  1 tablet Oral BID  . tamsulosin  0.4 mg Oral Daily   Continuous Infusions:    LOS: 16 days    Time spent: 25 mins,.More than 50% of that time was spent in counseling and/or coordination of care.      Shelly Coss, MD [facility] P11/23/2021, 8:12 AM

## 2020-10-09 ENCOUNTER — Encounter: Payer: Self-pay | Admitting: Radiation Oncology

## 2020-10-09 ENCOUNTER — Ambulatory Visit: Payer: Self-pay

## 2020-10-09 ENCOUNTER — Ambulatory Visit
Admit: 2020-10-09 | Discharge: 2020-10-09 | Disposition: A | Discharge status: Home / Self Care | Payer: Self-pay | Attending: Radiation Oncology | Admitting: Radiation Oncology

## 2020-10-09 LAB — CBC WITH DIFFERENTIAL/PLATELET
Abs Immature Granulocytes: 0.12 10*3/uL — ABNORMAL HIGH (ref 0.00–0.07)
Basophils Absolute: 0 10*3/uL (ref 0.0–0.1)
Basophils Relative: 0 %
Eosinophils Absolute: 0 10*3/uL (ref 0.0–0.5)
Eosinophils Relative: 0 %
HCT: 26.3 % — ABNORMAL LOW (ref 39.0–52.0)
Hemoglobin: 8.7 g/dL — ABNORMAL LOW (ref 13.0–17.0)
Immature Granulocytes: 1 %
Lymphocytes Relative: 3 %
Lymphs Abs: 0.3 10*3/uL — ABNORMAL LOW (ref 0.7–4.0)
MCH: 32.5 pg (ref 26.0–34.0)
MCHC: 33.1 g/dL (ref 30.0–36.0)
MCV: 98.1 fL (ref 80.0–100.0)
Monocytes Absolute: 0.5 10*3/uL (ref 0.1–1.0)
Monocytes Relative: 4 %
Neutro Abs: 10.2 10*3/uL — ABNORMAL HIGH (ref 1.7–7.7)
Neutrophils Relative %: 92 %
Platelets: 215 10*3/uL (ref 150–400)
RBC: 2.68 MIL/uL — ABNORMAL LOW (ref 4.22–5.81)
RDW: 15.9 % — ABNORMAL HIGH (ref 11.5–15.5)
WBC: 11.1 10*3/uL — ABNORMAL HIGH (ref 4.0–10.5)
nRBC: 0 % (ref 0.0–0.2)

## 2020-10-09 LAB — BASIC METABOLIC PANEL
Anion gap: 10 (ref 5–15)
BUN: 44 mg/dL — ABNORMAL HIGH (ref 6–20)
CO2: 21 mmol/L — ABNORMAL LOW (ref 22–32)
Calcium: 8.3 mg/dL — ABNORMAL LOW (ref 8.9–10.3)
Chloride: 103 mmol/L (ref 98–111)
Creatinine, Ser: 1.11 mg/dL (ref 0.61–1.24)
GFR, Estimated: >60 mL/min (ref >60)
Glucose, Bld: 117 mg/dL — ABNORMAL HIGH (ref 70–99)
Potassium: 4.7 mmol/L (ref 3.5–5.1)
Sodium: 134 mmol/L — ABNORMAL LOW (ref 135–145)

## 2020-10-09 MED ORDER — OXYCODONE HCL 5 MG PO TABS: 5.0000 mg | ORAL_TABLET | ORAL | 0 refills | Status: AC | PRN

## 2020-10-09 MED ORDER — OXYCODONE HCL ER 15 MG PO T12A
15.0000 mg | EXTENDED_RELEASE_TABLET | Freq: Once | ORAL | Status: AC
  Administered 2020-10-09: 15 mg via ORAL
  Filled 2020-10-09: qty 1

## 2020-10-09 MED ORDER — TAMSULOSIN HCL 0.4 MG PO CAPS: 0.4000 mg | ORAL_CAPSULE | Freq: Every day | ORAL | 0 refills | Status: AC

## 2020-10-09 MED ORDER — OXYCODONE HCL ER 15 MG PO T12A
15.0000 mg | EXTENDED_RELEASE_TABLET | Freq: Two times a day (BID) | ORAL | Status: DC
  Administered 2020-10-09 – 2020-10-11 (×6): 15 mg via ORAL
  Filled 2020-10-09 (×6): qty 1

## 2020-10-09 MED ORDER — OXYCODONE HCL ER 15 MG PO T12A: 15.0000 mg | EXTENDED_RELEASE_TABLET | Freq: Two times a day (BID) | ORAL | Status: DC

## 2020-10-09 MED ORDER — OLANZAPINE 2.5 MG PO TABS: 2.5000 mg | ORAL_TABLET | Freq: Every day | ORAL | 0 refills | Status: AC

## 2020-10-09 MED ORDER — LORAZEPAM 0.5 MG PO TABS: 0.5000 mg | ORAL_TABLET | ORAL | 0 refills | Status: AC | PRN

## 2020-10-09 MED ORDER — SENNOSIDES-DOCUSATE SODIUM 8.6-50 MG PO TABS: 1.0000 | ORAL_TABLET | Freq: Two times a day (BID) | ORAL | 0 refills | Status: AC

## 2020-10-09 MED ORDER — POLYETHYLENE GLYCOL 3350 17 G PO PACK: 17.0000 g | PACK | Freq: Two times a day (BID) | ORAL | 0 refills | Status: AC

## 2020-10-09 MED ORDER — PANTOPRAZOLE SODIUM 40 MG PO TBEC: 40.0000 mg | DELAYED_RELEASE_TABLET | Freq: Every day | ORAL | 0 refills | Status: AC

## 2020-10-09 MED ORDER — DEXAMETHASONE 4 MG PO TABS: 4.0000 mg | ORAL_TABLET | Freq: Two times a day (BID) | ORAL | 0 refills | Status: AC

## 2020-10-09 MED ORDER — OXYCODONE HCL ER 15 MG PO T12A: 15.0000 mg | EXTENDED_RELEASE_TABLET | Freq: Two times a day (BID) | ORAL | 0 refills | Status: AC

## 2020-10-09 NOTE — Progress Notes (Signed)
PROGRESS NOTE    Taisei Bonnette  NAT:[id number] DOB: 05-02-1961 DOA: 09/21/2020 PCP: Patient, No Pcp Per   Chief Complain: Gait problem  Brief Narrative: Patient is a 59 year old man who presented to the emergency department for the evaluation of back pain/lower extremity numbness, difficulty ambulation.  Imagings done in the emergency department showed widespread metastatic disease from lung cancer.  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 He was seen by oncology, radiation oncology, palliative medicine, IR and neurosurgery during this hospitalization.  Hospital course remarkable for new onset GI bleed 11/13, hypotension, AKI.  GI consulted and following.  Patient declined colonoscopy. PT/OT recommended SNF on DC. Plan is to continue ongoing radiation therapy treatment here and the daughter is planning to take her to [facility] ER by ambulance after finishing the course.  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) Active Problems:   Metastatic disease ([place])   Renal insufficiency   Current smoker   Acute ischemic stroke ([place])   Lung mass   Liver lesion   Metastasis to brain [facility])   Palliative care by specialist   Cancer associated pain   DNR (do not resuscitate) discussion   Anemia due to GI blood loss   Hematochezia   Acute GI bleed/acute blood loss anemia, stable: Sudden onset of bloody bowel movement on 09/28/2020. His hemoglobin dropped, blood pressure dropped requiring IV fluids, PPI.  Nuclear bleeding scan did not show any active bleeding 11/14.  GI recommended colonoscopy but patient declined.  GI signed off.  He denies any abdominal pain.  No further hematochezia. Currently hemoglobin stable in the range of 8.  Regular diet started on 10/02/20.  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  Oncology, radiation oncology, neurosurgery were following during this hospitalization. He has been started on  palliative radiation therapy . He might be a candidate for  possible  kyphoplasty at T7 in future( but not on this hospitalization).    Continue steroid IV,will change to oral on DC.  Palliative care was also following. Radiation oncology recommended to continue radiation treatment to complete 10-12 treatment courses.  Last treatment of radiation today 10/09/2020.   Acute urinary retention: Most likely secondary to brain/spinal mets.  Foley catheter placed.  Continue Foley catheter on discharge  Leukocytosis: Thought to be from steroids.  Stable.  Constipation: Continue aggressive bowel regimen.  Currently on MiraLAX twice a day and Senokot.   He denies taking enema.  No complaint of abdominal pain or discomfort today.  Few scattered subcentimeter acute ischemic nonhemorrhagic infarct: As per MRI.  Case discussed with neurology and recommended to start on aspirin 81 mg.  Aspirin has been stopped due to GI bleed.  Hyperkalemia: resolved treated with Lokelma  AKI on CKD stage III: Secondary to dehydration/urinary retention.  CT abdomen/pelvis did not show any obstructive nephropathy.  Continue to monitor kidney function.  Cancer-related pain/nausea: Continue supportive care, pain medications, antiemetics.  Also started on Zyprexa for refractory nausea.  Goals of care: relatively young patient with newly diagnosed metastatic lung cancer.  Very poor prognosis.  Poor quality of life.  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 We recommend hospice/palliative approach.  Disposition: PT/OT have recommended skilled nursing facility for  placement.  Difficult placement.  Patient is from [name].  He does not have insurance. Patient and his daughter wants to complete  radiation treatments and after that she wants  to take her father to [facility] hospital ER in [name] by ambulance, case manager updated  about this plan.radiation oncology planned to accelerate radiation treatment by doing 2 sessions a day but patient apparently declined.   DVT prophylaxis:SCD Code Status: Full Family Communication:daughter at bedside on 10/09/20 Status is: Inpatient  Remains inpatient appropriate because:Inpatient level of care appropriate due to severity of illness   Dispo: The patient is from: Home              Anticipated d/c is to: Daughter will take him home once transport can be set up likely this weekend, 10/12/2020              Anticipated d/c date is: As soon as he completes radiation treatment and cleared by radiation oncology, transport has been arranged by daughter         Consultants: GI, oncology, radiation oncology, palliative care, neurosurgery  Procedures: Biopsy  Antimicrobials:  Anti-infectives (From admission, onward)   None      Subjective: No acute issues or events overnight, patient denies nausea, vomiting, diarrhea, constipation, headache, fevers, chills.  He continues to complain about "the plan changing" which we discussed the only thing that has changed in the past 48 hours has been weaning him off of IV narcotics as well as attempted to have his daughter set up transportation for travel [place].  Objective: Vitals:   10/08/20 0843 10/08/20 1304 10/08/20 2051 10/09/20 0626  BP: 119/72 113/72 113/74 112/72  Pulse: (!) 103 (!) 103 (!) 109 (!) 106  Resp: 18 18 16    Temp: 98 F (36.7 C) 98.4 F (36.9 C) 98.2 F (36.8 C) 97.9 F (36.6 C)  TempSrc: Oral Oral Oral Oral  SpO2: 100% 100% 97% 98%  Weight:      Height:        Intake/Output Summary (Last 24 hours) at 10/09/2020 0719 Last data filed at 10/09/2020 3716 Gross per 24 hour  Intake --  Output 1500 ml  Net -1500 ml   Filed Weights   09/29/20 0700 10/02/20 2057 10/03/20 0522  Weight: 85.8 kg 89.8 kg 88.6 kg     Examination:  General exam: Chronically looking, weak Respiratory system: Bilateral equal air entry, normal vesicular breath sounds, no wheezes or crackles  Cardiovascular system: S1 & S2 heard, RRR. No JVD, murmurs, rubs, gallops or clicks. Gastrointestinal system: Abdomen is nondistended, soft and nontender. No organomegaly or masses felt. Normal bowel sounds heard. Central nervous system: Alert and oriented.  Paraplegia. Extremities: No edema, no clubbing ,no cyanosis, Skin: No rashes, lesions or ulcers,no icterus ,no pallor GU: Foley      Data Reviewed: I have personally reviewed following labs and imaging studies  CBC: Recent Labs  Lab 10/03/20 0607 10/05/20 0647 10/07/20 0549  WBC 14.8* 15.6* 14.5*  NEUTROABS 12.9* 14.2* 13.0*  HGB 8.3* 8.2* 8.2*  HCT 24.6* 24.7* 25.1*  MCV 95.0 96.1 98.8  PLT 255 252 967   Basic Metabolic Panel: Recent Labs  Lab 10/03/20 0607 10/05/20 0647 10/07/20 0549  NA 132* 135 137  K 5.1 4.8 4.9  CL 99 101 104  CO2 24 24 23   GLUCOSE 118* 118* 117*  BUN 36* 48* 50*  CREATININE 1.36* 1.44* 1.47*  CALCIUM 7.9* 7.9* 8.0*   GFR: Estimated Creatinine Clearance: 62.1 mL/min (A) (  by C-G formula based on SCr of 1.47 mg/dL (H)). Liver Function Tests: No results for input(s): AST, ALT, ALKPHOS, BILITOT, PROT, ALBUMIN in the last 168 hours. No results for input(s): LIPASE, AMYLASE in the last 168 hours. No results for input(s): AMMONIA in the last 168 hours. Coagulation Profile: No results for input(s): INR, PROTIME in the last 168 hours. Cardiac Enzymes: No results for input(s): CKTOTAL, CKMB, CKMBINDEX, TROPONINI in the last 168 hours. BNP (last 3 results) No results for input(s): PROBNP in the last 8760 hours. HbA1C: No results for input(s): HGBA1C in the last 72 hours. CBG: No results for input(s): GLUCAP in the last 168 hours. Lipid Profile: No results for input(s): CHOL, HDL, LDLCALC, TRIG, CHOLHDL, LDLDIRECT in the last 72  hours. Thyroid Function Tests: No results for input(s): TSH, T4TOTAL, FREET4, T3FREE, THYROIDAB in the last 72 hours. Anemia Panel: No results for input(s): VITAMINB12, FOLATE, FERRITIN, TIBC, IRON, RETICCTPCT in the last 72 hours. Sepsis Labs: No results for input(s): PROCALCITON, LATICACIDVEN in the last 168 hours.  Recent Results (from the past 240 hour(s))  MRSA PCR Screening     Status: None   Collection Time: 09/29/20  8:23 PM   Specimen: Nasal Mucosa; Nasopharyngeal  Result Value Ref Range Status   MRSA by PCR NEGATIVE NEGATIVE Final    Comment:        The GeneXpert MRSA Assay (FDA approved for NASAL specimens only), is one component of a comprehensive MRSA colonization surveillance program. It is not intended to diagnose MRSA infection nor to guide or monitor treatment for MRSA infections. Performed at [facility], [place] [address], [place], [place] [address]   Resp Panel by RT-PCR (Flu A&B, Covid) Nasopharyngeal Swab     Status: None   Collection Time: 10/08/20  5:29 PM   Specimen: Nasopharyngeal Swab; Nasopharyngeal(NP) swabs in vial transport medium  Result Value Ref Range Status   SARS Coronavirus 2 by RT PCR NEGATIVE NEGATIVE Final    Comment: (NOTE) SARS-CoV-2 target nucleic acids are NOT DETECTED.  The SARS-CoV-2 RNA is generally detectable in upper respiratory specimens during the acute phase of infection. The lowest concentration of SARS-CoV-2 viral copies this assay can detect is 138 copies/mL. A negative result does not preclude SARS-Cov-2 infection and should not be used as the sole basis for treatment or other patient management decisions. A negative result may occur with  improper specimen collection/handling, submission of specimen other than nasopharyngeal swab, presence of viral mutation(s) within the areas targeted by this assay, and inadequate number of viral copies(<138 copies/mL). A negative result must be combined  with clinical observations, patient history, and epidemiological information. The expected result is Negative.  Fact Sheet for Patients:  EntrepreneurPulse.com.au  Fact Sheet for Healthcare Providers:  IncredibleEmployment.be  This test is no t yet approved or cleared by the [place] FDA and  has been authorized for detection and/or diagnosis of SARS-CoV-2 by FDA under an Emergency Use Authorization (EUA). This EUA will remain  in effect (meaning this test can be used) for the duration of the COVID-19 declaration under Section 564(b)(1) of the Act, 21 U.S.C.section 360bbb-3(b)(1), unless the authorization is terminated  or revoked sooner.       Influenza A by PCR NEGATIVE NEGATIVE Final   Influenza B by PCR NEGATIVE NEGATIVE Final    Comment: (NOTE) The Xpert Xpress SARS-CoV-2/FLU/RSV plus assay is intended as an aid in the diagnosis of influenza from Nasopharyngeal swab specimens and should not be used as  a sole basis for treatment. Nasal washings and aspirates are unacceptable for Xpert Xpress SARS-CoV-2/FLU/RSV testing.  Fact Sheet for Patients: EntrepreneurPulse.com.au  Fact Sheet for Healthcare Providers: IncredibleEmployment.be  This test is not yet approved or cleared by the [place] FDA and has been authorized for detection and/or diagnosis of SARS-CoV-2 by FDA under an Emergency Use Authorization (EUA). This EUA will remain in effect (meaning this test can be used) for the duration of the COVID-19 declaration under Section 564(b)(1) of the Act, 21 U.S.C. section 360bbb-3(b)(1), unless the authorization is terminated or revoked.  Performed at [facility], [place] [address] [place], [place] [address]          Radiology Studies: No results found.      Scheduled Meds: . Chlorhexidine Gluconate Cloth  6 each Topical Daily  . dexamethasone (DECADRON)  injection  4 mg Intravenous Q12H  . guaiFENesin  600 mg Oral BID  . mouth rinse  15 mL Mouth Rinse BID  . OLANZapine  2.5 mg Oral QHS  . oxyCODONE  15 mg Oral Q12H  . pantoprazole  40 mg Oral BID  . polyethylene glycol  17 g Oral BID  . senna-docusate  1 tablet Oral BID  . tamsulosin  0.4 mg Oral Daily   Continuous Infusions:    LOS: 17 days    Time spent: 25 mins,.More than 50% of that time was spent in counseling and/or coordination of care.  Little Ishikawa, DO [facility] P11/24/2021, 7:19 AM

## 2020-10-09 NOTE — Progress Notes (Signed)
Patient asked about his scheduled 15 mg extended release oxycodone he was previously receiving during his stay here. After looking at the [facility], RN let the patient know he was no longer ordered this medication. The patient stated he was upset. Patient stated,"I am being punished for no reason." RN explained that she did not know why he was no longer ordered this medication, but she would reach out to the on call night shift NP. After speaking with the patient, [place], NP reordered the scheduled oxycodone. RN spoke with the patient's daughter, Estill Bamberg on the phone about this as well.

## 2020-10-09 NOTE — TOC Progression Note (Addendum)
Transition of Care [facility]) - Progression Note    Patient Details  Name: Tyler Barker MRN: [id number] Date of Birth: [birth year]  Transition of Care [facility]) CM/SW Contact  Bhakti Labella, Marjie Skiff, RN Phone Number: 10/09/2020, 12:19 PM  Clinical Narrative:    Updated Covid test faxed to [place] [phone]) per pt daughter request. MD also asked to print pt comfort scripts by Friday so daughter can have them filled prior to dc on Saturday.  Expected Discharge Plan: Home/Self Care Barriers to Discharge: Continued Medical Work up, Inadequate or no insurance  Expected Discharge Plan and Services Expected Discharge Plan: Home/Self Care In-house Referral: Chaplain, Museum/gallery curator Counselor Discharge Planning Services: CM Consult   Living arrangements for the past 2 months: Single Family Home                     Social Determinants of Health (SDOH) Interventions    Readmission Risk Interventions No flowsheet data found.

## 2020-10-10 NOTE — Progress Notes (Signed)
PROGRESS NOTE    Tyler Barker  OHY:[id number] DOB: [birth year] DOA: 09/21/2020 PCP: Patient, No Pcp Per   Chief Complain: Gait problem  Brief Narrative: Patient is a 59 year old man who presented to the emergency department for the evaluation of back pain/lower extremity numbness, difficulty ambulation.  Imagings done in the emergency department showed widespread metastatic disease from lung cancer.  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 He was seen by oncology, radiation oncology, palliative medicine, IR and neurosurgery during this hospitalization.  Hospital course remarkable for new onset GI bleed 11/13, hypotension, AKI.  GI consulted and following.  Patient declined colonoscopy. PT/OT recommended SNF on DC. Patient completed radiation and is otherwise medically stable for discharge to SNF/facility - family wants to take patient to [place] near home to local facility vs home pending ongoing ambulation/needs.  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 His hemoglobin dropped, blood pressure dropped requiring IV fluids, PPI.  Nuclear bleeding scan did not show any active bleeding 11/14.  GI recommended colonoscopy but patient declined.  GI signed off.  He denies any abdominal pain.  No further hematochezia. Currently hemoglobin stable in the range of 8.  Regular diet started on 10/02/20.  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   Oncology, radiation oncology, neurosurgery were following during this hospitalization.  He has been completed  palliative radiation therapy as of 10/09/20 May be candidate for kyphoplasty with outpatient follow up pending clinical improvement post radiation/PT Continue steroid IV,will change to oral on DC.  Palliative care was also following.  Acute urinary retention: Most likely secondary to brain/spinal mets.  Foley catheter placed.  Continue Foley catheter on discharge  Leukocytosis: Thought to be from steroids.  Stable.  Constipation: Continue aggressive bowel regimen.  Currently on MiraLAX twice a day and Senokot.   He denies taking enema.  No complaint of abdominal pain or discomfort today.  Few scattered subcentimeter acute ischemic nonhemorrhagic infarct: As per MRI.  Case discussed with neurology and recommended to start on aspirin 81 mg.  Aspirin has been stopped due to GI bleed.  Hyperkalemia: resolved treated with Lokelma  AKI on CKD stage III: Secondary to dehydration/urinary retention.  CT abdomen/pelvis did not show any obstructive nephropathy.  Continue to monitor kidney function.  Cancer-related pain/nausea: Continue supportive care, pain medications, antiemetics.  Also started on Zyprexa for refractory nausea.  Goals of care: relatively young patient with newly diagnosed metastatic lung cancer.  Very poor prognosis.  Poor quality of life.  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 We recommend hospice/palliative approach.  Disposition: PT/OT have recommended skilled nursing facility for placement.  Difficult placement.  Patient is from [name].  He does not have insurance. Patient and his daughter wants to  complete  radiation treatments and after that she wants to take her father to [place] in [name] by ambulance, case manager updated  about this  plan.radiation oncology planned to accelerate radiation treatment by doing 2 sessions a day but patient apparently declined.   DVT prophylaxis:SCD Code Status: Full Family Communication:daughter at bedside on 10/09/20 Status is: Inpatient  Remains inpatient appropriate because:Inpatient level of care appropriate due to severity of illness   Dispo: The patient is from: Home              Anticipated d/c is to: Daughter will take him home once transport can be set up likely this weekend, 10/12/2020              Anticipated d/c date is: As soon as he completes radiation treatment and cleared by radiation oncology, transport has been arranged by daughter         Consultants: GI, oncology, radiation oncology, palliative care, neurosurgery  Procedures: Biopsy  Antimicrobials:  Anti-infectives (From admission, onward)   None      Subjective: No acute issues or events overnight, patient denies nausea, vomiting, diarrhea, constipation, headache, fevers, chills.  He continues to complain about "the plan changing" which we discussed the only thing that has changed in the past 48 hours has been weaning him off of IV narcotics as well as attempted to have his daughter set up transportation for travel [place].  Objective: Vitals:   10/09/20 0626 10/09/20 1439 10/09/20 2153 10/10/20 0514  BP: 112/72 122/80 109/78 110/78  Pulse: (!) 106 96 100 92  Resp:  18  16  Temp: 97.9 F (36.6 C) 98.1 F (36.7 C) 97.8 F (36.6 C) 97.6 F (36.4 C)  TempSrc: Oral Oral Oral Oral  SpO2: 98% 98% 100% 98%  Weight:      Height:        Intake/Output Summary (Last 24 hours) at 10/10/2020 0750 Last data filed at 10/10/2020 0520 Gross per 24 hour  Intake --  Output 1850 ml  Net -1850 ml   Filed Weights   09/29/20 0700 10/02/20 2057 10/03/20 0522  Weight: 85.8 kg 89.8 kg 88.6 kg    Examination:  General exam: Chronically looking, weak Respiratory system: Bilateral equal air entry, normal vesicular  breath sounds, no wheezes or crackles  Cardiovascular system: S1 & S2 heard, RRR. No JVD, murmurs, rubs, gallops or clicks. Gastrointestinal system: Abdomen is nondistended, soft and nontender. No organomegaly or masses felt. Normal bowel sounds heard. Central nervous system: Alert and oriented.  Paraplegia. Extremities: No edema, no clubbing ,no cyanosis, Skin: No rashes, lesions or ulcers,no icterus ,no pallor GU: Foley      Data Reviewed: I have personally reviewed following labs and imaging studies  CBC: Recent Labs  Lab 10/05/20 0647 10/07/20 0549 10/09/20 0710  WBC 15.6* 14.5* 11.1*  NEUTROABS 14.2* 13.0* 10.2*  HGB 8.2* 8.2* 8.7*  HCT 24.7* 25.1* 26.3*  MCV 96.1 98.8 98.1  PLT 252 212 937   Basic Metabolic Panel: Recent Labs  Lab 10/05/20 0647 10/07/20 0549 10/09/20 0710  NA 135 137 134*  K 4.8 4.9 4.7  CL 101 104 103  CO2 24 23 21*  GLUCOSE 118* 117* 117*  BUN 48* 50* 44*  CREATININE 1.44* 1.47* 1.11  CALCIUM 7.9* 8.0* 8.3*   GFR: Estimated Creatinine Clearance: 82.3 mL/min (by C-G formula based on SCr of 1.11 mg/dL). Liver Function Tests: No results for input(s): AST, ALT, ALKPHOS, BILITOT, PROT, ALBUMIN in the last  168 hours. No results for input(s): LIPASE, AMYLASE in the last 168 hours. No results for input(s): AMMONIA in the last 168 hours. Coagulation Profile: No results for input(s): INR, PROTIME in the last 168 hours. Cardiac Enzymes: No results for input(s): CKTOTAL, CKMB, CKMBINDEX, TROPONINI in the last 168 hours. BNP (last 3 results) No results for input(s): PROBNP in the last 8760 hours. HbA1C: No results for input(s): HGBA1C in the last 72 hours. CBG: No results for input(s): GLUCAP in the last 168 hours. Lipid Profile: No results for input(s): CHOL, HDL, LDLCALC, TRIG, CHOLHDL, LDLDIRECT in the last 72 hours. Thyroid Function Tests: No results for input(s): TSH, T4TOTAL, FREET4, T3FREE, THYROIDAB in the last 72 hours. Anemia  Panel: No results for input(s): VITAMINB12, FOLATE, FERRITIN, TIBC, IRON, RETICCTPCT in the last 72 hours. Sepsis Labs: No results for input(s): PROCALCITON, LATICACIDVEN in the last 168 hours.  Recent Results (from the past 240 hour(s))  Resp Panel by RT-PCR (Flu A&B, Covid) Nasopharyngeal Swab     Status: None   Collection Time: 10/08/20  5:29 PM   Specimen: Nasopharyngeal Swab; Nasopharyngeal(NP) swabs in vial transport medium  Result Value Ref Range Status   SARS Coronavirus 2 by RT PCR NEGATIVE NEGATIVE Final    Comment: (NOTE) SARS-CoV-2 target nucleic acids are NOT DETECTED.  The SARS-CoV-2 RNA is generally detectable in upper respiratory specimens during the acute phase of infection. The lowest concentration of SARS-CoV-2 viral copies this assay can detect is 138 copies/mL. A negative result does not preclude SARS-Cov-2 infection and should not be used as the sole basis for treatment or other patient management decisions. A negative result may occur with  improper specimen collection/handling, submission of specimen other than nasopharyngeal swab, presence of viral mutation(s) within the areas targeted by this assay, and inadequate number of viral copies(<138 copies/mL). A negative result must be combined with clinical observations, patient history, and epidemiological information. The expected result is Negative.  Fact Sheet for Patients:  EntrepreneurPulse.com.au  Fact Sheet for Healthcare Providers:  IncredibleEmployment.be  This test is no t yet approved or cleared by the [place] FDA and  has been authorized for detection and/or diagnosis of SARS-CoV-2 by FDA under an Emergency Use Authorization (EUA). This EUA will remain  in effect (meaning this test can be used) for the duration of the COVID-19 declaration under Section 564(b)(1) of the Act, 21 U.S.C.section 360bbb-3(b)(1), unless the authorization is terminated  or  revoked sooner.       Influenza A by PCR NEGATIVE NEGATIVE Final   Influenza B by PCR NEGATIVE NEGATIVE Final    Comment: (NOTE) The Xpert Xpress SARS-CoV-2/FLU/RSV plus assay is intended as an aid in the diagnosis of influenza from Nasopharyngeal swab specimens and should not be used as a sole basis for treatment. Nasal washings and aspirates are unacceptable for Xpert Xpress SARS-CoV-2/FLU/RSV testing.  Fact Sheet for Patients: EntrepreneurPulse.com.au  Fact Sheet for Healthcare Providers: IncredibleEmployment.be  This test is not yet approved or cleared by the [place] FDA and has been authorized for detection and/or diagnosis of SARS-CoV-2 by FDA under an Emergency Use Authorization (EUA). This EUA will remain in effect (meaning this test can be used) for the duration of the COVID-19 declaration under Section 564(b)(1) of the Act, 21 U.S.C. section 360bbb-3(b)(1), unless the authorization is terminated or revoked.  Performed at [facility], [place] [address], [place], [place] [address]          Radiology Studies: No results found.  Scheduled Meds: . Chlorhexidine Gluconate Cloth  6 each Topical Daily  . dexamethasone (DECADRON) injection  4 mg Intravenous Q12H  . guaiFENesin  600 mg Oral BID  . mouth rinse  15 mL Mouth Rinse BID  . OLANZapine  2.5 mg Oral QHS  . oxyCODONE  15 mg Oral Q12H  . pantoprazole  40 mg Oral BID  . polyethylene glycol  17 g Oral BID  . senna-docusate  1 tablet Oral BID  . tamsulosin  0.4 mg Oral Daily   Continuous Infusions:    LOS: 18 days    Time spent: 25 mins,.More than 50% of that time was spent in counseling and/or coordination of care.  Little Ishikawa, DO [facility] P11/25/2021, 7:50 AM

## 2020-10-11 DIAGNOSIS — K922 Gastrointestinal hemorrhage, unspecified: Secondary | ICD-10-CM

## 2020-10-11 NOTE — TOC Progression Note (Signed)
Transition of Care [facility]) - Progression Note    Patient Details  Name: Tyler Barker MRN: [id number] Date of Birth: November 07, 1961  Transition of Care [facility]) CM/SW [place], [place] Phone Number: 10/11/2020, 9:52 AM  Clinical Narrative:    CSW reached out to daughter Tyler Barker, she confirm she will pick up the patient medications at the pharmacy at 1:00pm today. She also received confirmation from the [facility] transportation for transport tomorrow.    Expected Discharge Plan: Home/Self Care Barriers to Discharge: Transportation  Expected Discharge Plan and Services Expected Discharge Plan: Home/Self Care In-house Referral: Chaplain, Financial Counselor Discharge Planning Services: CM Consult   Living arrangements for the past 2 months: Single Family Home                                       Social Determinants of Health (SDOH) Interventions    Readmission Risk Interventions No flowsheet data found.

## 2020-10-11 NOTE — Progress Notes (Signed)
PROGRESS NOTE    Tyler Barker  VQQ:[id number] DOB: 04/23/1961 DOA: 09/21/2020 PCP: Patient, No Pcp Per   Chief Complain: Gait problem  Brief Narrative: Patient is a 59 year old man who presented to the emergency department for the evaluation of back pain/lower extremity numbness, difficulty ambulation.  Imagings done in the emergency department showed widespread metastatic disease from lung cancer.  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 He was seen by oncology, radiation oncology, palliative medicine, IR and neurosurgery during this hospitalization.  Hospital course remarkable for new onset GI bleed 11/13, hypotension, AKI.  GI consulted and following.  Patient declined colonoscopy. PT/OT recommended SNF on DC. Patient completed radiation and is otherwise medically stable for discharge to SNF/facility - family wants to take patient to [place] near home to local facility vs home pending ongoing ambulation/needs.  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 His hemoglobin dropped, blood pressure dropped requiring IV fluids, PPI.  Nuclear bleeding scan did not show any active bleeding 11/14.  GI recommended colonoscopy but patient declined.  GI signed off.  He denies any abdominal pain.  No further hematochezia. Currently hemoglobin stable in the range of 8.  Regular diet started on 10/02/20.  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   Oncology, radiation oncology, neurosurgery were following during this hospitalization.  He has been completed  palliative radiation therapy as of 10/09/20 May be candidate for kyphoplasty with outpatient follow up pending clinical improvement post radiation/PT Continue steroid IV,will change to oral on DC.  Palliative care was also following.  Acute urinary retention: Most likely secondary to brain/spinal mets.  Foley catheter placed.  Continue Foley catheter on discharge  Leukocytosis: Thought to be from steroids.  Stable.  Constipation: Continue aggressive bowel regimen.  Currently on MiraLAX twice a day and Senokot.   He denies taking enema.  No complaint of abdominal pain or discomfort today.  Few scattered subcentimeter acute ischemic nonhemorrhagic infarct: As per MRI.  Case discussed with neurology and recommended to start on aspirin 81 mg.  Aspirin has been stopped due to GI bleed.  Hyperkalemia: resolved treated with Lokelma  AKI on CKD stage III: Secondary to dehydration/urinary retention.  CT abdomen/pelvis did not show any obstructive nephropathy.  Continue to monitor kidney function.  Cancer-related pain/nausea: Continue supportive care, pain medications, antiemetics.  Also started on Zyprexa for refractory nausea.  Goals of care: relatively young patient with newly diagnosed metastatic lung cancer.  Very poor prognosis.  Poor quality of life.  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 We recommend hospice/palliative approach.  Disposition: PT/OT have recommended skilled nursing facility for placement.  Difficult placement.  Patient is from [name].  He does not have insurance. Patient and his daughter wants to  complete  radiation treatments and after that she wants to take her father to [name] in [name] by ambulance, case manager updated  about this  plan.radiation oncology planned to accelerate radiation treatment by doing 2 sessions a day but patient apparently declined.   DVT prophylaxis:SCD Code Status: Full Family Communication:daughter at bedside on 10/09/20 Status is: Inpatient  Remains inpatient appropriate because:Inpatient level of care appropriate due to severity of illness  Dispo: The patient is from: Home              Anticipated d/c is to: Daughter will take him home once transport can be set up likely this weekend, 10/12/2020              Anticipated d/c date is: As soon as he completes radiation treatment and cleared by radiation oncology, transport has been arranged by daughter       Consultants: GI, oncology, radiation oncology, palliative care, neurosurgery  Procedures: Biopsy  Antimicrobials:  Anti-infectives (From admission, onward)   None      Subjective: No acute issues or events overnight, patient denies nausea, vomiting, diarrhea, constipation, headache, fevers, chills.   Objective: Vitals:   10/10/20 0514 10/10/20 1436 10/10/20 2253 10/11/20 0513  BP: 110/78 120/78 103/72 115/75  Pulse: 92 98 (!) 105 (!) 104  Resp: 16 16 16 16   Temp: 97.6 F (36.4 C) 98.2 F (36.8 C) 98 F (36.7 C) 98.3 F (36.8 C)  TempSrc: Oral Oral Oral Oral  SpO2: 98% 98% 97% 97%  Weight:      Height:        Intake/Output Summary (Last 24 hours) at 10/11/2020 0813 Last data filed at 10/10/2020 1300 Gross per 24 hour  Intake 594 ml  Output --  Net 594 ml   Filed Weights   09/29/20 0700 10/02/20 2057 10/03/20 0522  Weight: 85.8 kg 89.8 kg 88.6 kg    Examination:  General exam: Chronically looking, weak Respiratory system: Bilateral equal air entry, normal vesicular breath sounds, no wheezes or crackles  Cardiovascular system: S1 & S2 heard, RRR. No JVD, murmurs, rubs, gallops or clicks. Gastrointestinal system: Abdomen is nondistended, soft and nontender. No organomegaly or masses felt. Normal bowel sounds  heard. Central nervous system: Alert and oriented.  Paraplegia. Extremities: No edema, no clubbing ,no cyanosis, Skin: No rashes, lesions or ulcers,no icterus ,no pallor GU: Foley      Data Reviewed: I have personally reviewed following labs and imaging studies  CBC: Recent Labs  Lab 10/05/20 0647 10/07/20 0549 10/09/20 0710  WBC 15.6* 14.5* 11.1*  NEUTROABS 14.2* 13.0* 10.2*  HGB 8.2* 8.2* 8.7*  HCT 24.7* 25.1* 26.3*  MCV 96.1 98.8 98.1  PLT 252 212 412   Basic Metabolic Panel: Recent Labs  Lab 10/05/20 0647 10/07/20 0549 10/09/20 0710  NA 135 137 134*  K 4.8 4.9 4.7  CL 101 104 103  CO2 24 23 21*  GLUCOSE 118* 117* 117*  BUN 48* 50* 44*  CREATININE 1.44* 1.47* 1.11  CALCIUM 7.9* 8.0* 8.3*   GFR: Estimated Creatinine Clearance: 82.3 mL/min (by C-G formula based on SCr of 1.11 mg/dL). Liver Function Tests: No results for input(s): AST, ALT, ALKPHOS, BILITOT, PROT, ALBUMIN in the last 168 hours. No results for input(s): LIPASE, AMYLASE in the last 168 hours. No results for input(s): AMMONIA in the last 168 hours. Coagulation Profile: No results for input(s): INR, PROTIME in the last 168 hours. Cardiac Enzymes: No results for input(s): CKTOTAL, CKMB, CKMBINDEX, TROPONINI  in the last 168 hours. BNP (last 3 results) No results for input(s): PROBNP in the last 8760 hours. HbA1C: No results for input(s): HGBA1C in the last 72 hours. CBG: No results for input(s): GLUCAP in the last 168 hours. Lipid Profile: No results for input(s): CHOL, HDL, LDLCALC, TRIG, CHOLHDL, LDLDIRECT in the last 72 hours. Thyroid Function Tests: No results for input(s): TSH, T4TOTAL, FREET4, T3FREE, THYROIDAB in the last 72 hours. Anemia Panel: No results for input(s): VITAMINB12, FOLATE, FERRITIN, TIBC, IRON, RETICCTPCT in the last 72 hours. Sepsis Labs: No results for input(s): PROCALCITON, LATICACIDVEN in the last 168 hours.  Recent Results (from the past 240 hour(s))  Resp Panel  by RT-PCR (Flu A&B, Covid) Nasopharyngeal Swab     Status: None   Collection Time: 10/08/20  5:29 PM   Specimen: Nasopharyngeal Swab; Nasopharyngeal(NP) swabs in vial transport medium  Result Value Ref Range Status   SARS Coronavirus 2 by RT PCR NEGATIVE NEGATIVE Final    Comment: (NOTE) SARS-CoV-2 target nucleic acids are NOT DETECTED.  The SARS-CoV-2 RNA is generally detectable in upper respiratory specimens during the acute phase of infection. The lowest concentration of SARS-CoV-2 viral copies this assay can detect is 138 copies/mL. A negative result does not preclude SARS-Cov-2 infection and should not be used as the sole basis for treatment or other patient management decisions. A negative result may occur with  improper specimen collection/handling, submission of specimen other than nasopharyngeal swab, presence of viral mutation(s) within the areas targeted by this assay, and inadequate number of viral copies(<138 copies/mL). A negative result must be combined with clinical observations, patient history, and epidemiological information. The expected result is Negative.  Fact Sheet for Patients:  EntrepreneurPulse.com.au  Fact Sheet for Healthcare Providers:  IncredibleEmployment.be  This test is no t yet approved or cleared by the [place] FDA and  has been authorized for detection and/or diagnosis of SARS-CoV-2 by FDA under an Emergency Use Authorization (EUA). This EUA will remain  in effect (meaning this test can be used) for the duration of the COVID-19 declaration under Section 564(b)(1) of the Act, 21 U.S.C.section 360bbb-3(b)(1), unless the authorization is terminated  or revoked sooner.       Influenza A by PCR NEGATIVE NEGATIVE Final   Influenza B by PCR NEGATIVE NEGATIVE Final    Comment: (NOTE) The Xpert Xpress SARS-CoV-2/FLU/RSV plus assay is intended as an aid in the diagnosis of influenza from Nasopharyngeal swab  specimens and should not be used as a sole basis for treatment. Nasal washings and aspirates are unacceptable for Xpert Xpress SARS-CoV-2/FLU/RSV testing.  Fact Sheet for Patients: EntrepreneurPulse.com.au  Fact Sheet for Healthcare Providers: IncredibleEmployment.be  This test is not yet approved or cleared by the [place] FDA and has been authorized for detection and/or diagnosis of SARS-CoV-2 by FDA under an Emergency Use Authorization (EUA). This EUA will remain in effect (meaning this test can be used) for the duration of the COVID-19 declaration under Section 564(b)(1) of the Act, 21 U.S.C. section 360bbb-3(b)(1), unless the authorization is terminated or revoked.  Performed at [facility], [place] [address], [place], [place] [address]          Radiology Studies: No results found.      Scheduled Meds: . Chlorhexidine Gluconate Cloth  6 each Topical Daily  . dexamethasone (DECADRON) injection  4 mg Intravenous Q12H  . guaiFENesin  600 mg Oral BID  . mouth rinse  15 mL Mouth Rinse BID  . OLANZapine  2.5 mg Oral QHS  . oxyCODONE  15 mg Oral Q12H  . pantoprazole  40 mg Oral BID  . polyethylene glycol  17 g Oral BID  . senna-docusate  1 tablet Oral BID  . tamsulosin  0.4 mg Oral Daily   Continuous Infusions:    LOS: 19 days    Time spent: 25 mins,.More than 50% of that time was spent in counseling and/or coordination of care.  Little Ishikawa, DO [facility] P11/26/2021, 8:13 AM

## 2020-10-12 NOTE — Discharge Summary (Signed)
Physician Discharge Summary  Tyler Barker NGE:[id number] DOB: 1961/09/27 DOA: 09/21/2020  PCP: Patient, No Pcp Per  Admit date: 09/21/2020 Discharge date: 10/12/2020  Admitted From: Home Disposition:  Home  Recommendations for Outpatient Follow-up:  1. Follow up with PCP in 1-2 weeks 2. Please obtain BMP/CBC in one week  Discharge Condition: Guarded   CODE STATUS: DNR  Diet recommendation:  As tolerated  Brief/Interim Summary: Patient is a 60 year old man who presented to the emergency department for the evaluation of back pain/lower extremity numbness, difficulty ambulation.  Imagings done in the emergency department showed widespread metastatic disease from lung cancer.  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 He was seen by oncology, radiation oncology, palliative medicine, IR and neurosurgery during this hospitalization.  Hospital course remarkable for new onset GI bleed 11/13, hypotension, AKI.  GI consulted and following.  Patient declined colonoscopy. PT/OT recommended SNF on DC. Patient completed radiation and is otherwise medically stable for discharge to SNF/facility - family arranging transport to [place] so patient can be near family.  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 [facility]) Active Problems:   Metastatic disease ([place])   Renal insufficiency   Current smoker   Acute ischemic stroke ([place])   Lung mass   Liver lesion   Metastasis to brain [facility])   Palliative care by specialist   Cancer associated pain   DNR (do not resuscitate) discussion   Anemia due to GI blood loss   Hematochezia    Discharge Instructions  Discharge Instructions    Diet - low sodium heart healthy   Complete by: As directed    Increase activity slowly   Complete by: As directed    No wound care   Complete by: As directed      Allergies as of 10/12/2020   No Known  Allergies     Medication List    STOP taking these medications   naproxen sodium 220 MG tablet Commonly known as: ALEVE     TAKE these medications   dexamethasone 4 MG tablet Commonly known as: DECADRON Take 1 tablet (4 mg total) by mouth 2 (two) times daily with a meal.   LORazepam 0.5 MG tablet Commonly known as: ATIVAN Take 1 tablet (0.5 mg total) by mouth every 4 (four) hours as needed (prn nausea refractory to zofran).   OLANZapine 2.5 MG tablet Commonly known as: ZYPREXA Take 1 tablet (2.5 mg total) by mouth at bedtime.   oxyCODONE 5 MG immediate release tablet Commonly known as: Oxy IR/ROXICODONE Take 1 tablet (5 mg total) by mouth every 4 (four) hours as needed for moderate pain.   oxyCODONE 15 mg 12 hr tablet Commonly known as: OXYCONTIN Take 1 tablet (15 mg total) by mouth every 12 (twelve) hours for 5 days.   pantoprazole 40 MG tablet Commonly known as: PROTONIX Take 1 tablet (40 mg total) by mouth daily.   polyethylene glycol 17 g packet Commonly known as: MIRALAX / GLYCOLAX Take 17 g by mouth 2 (two) times daily.   senna-docusate 8.6-50 MG tablet Commonly known as: Senokot-S Take 1 tablet by mouth 2 (two) times daily.   tamsulosin 0.4 MG Caps capsule Commonly known as: FLOMAX Take 1 capsule (0.4 mg total) by mouth daily.       Follow-up Information    [place]. Schedule an appointment as soon as  possible for a visit.   Contact information: [place] [address] [phone]             No Known Allergies  Consultations: Med-Oncology, Radiation oncology  Procedures/Studies: CT Head Wo Contrast  Result Date: 09/21/2020 CLINICAL DATA:  Dizziness and leg weakness for the past few days. EXAM: CT HEAD WITHOUT CONTRAST TECHNIQUE: Contiguous axial images were obtained from the base of the skull through the vertex without intravenous contrast. COMPARISON:  None. FINDINGS:  Brain: Cortical and subcortical hypodensity with loss of the normal gray-white matter differentiation in the posterior right parietal lobe, concerning for acute infarct. Small old lacunar infarct in the left cerebellum. No evidence of hemorrhage, hydrocephalus, extra-axial collection or mass lesion/mass effect. Vascular: No hyperdense vessel or unexpected calcification. Skull: Normal. Negative for fracture or focal lesion. Sinuses/Orbits: No acute finding. Other: None. IMPRESSION: 1. Findings concerning for acute infarct in the posterior right parietal lobe. No hemorrhage. 2. Small old lacunar infarct in the left cerebellum. Electronically Signed   By: Titus Dubin M.D.   On: 09/21/2020 17:14   MR BRAIN W WO CONTRAST  Result Date: 09/22/2020 EXAM: MRI HEAD WITHOUT AND WITH CONTRAST TECHNIQUE: Multiplanar, multiecho pulse sequences of the brain and surrounding structures were obtained without and with intravenous contrast. CONTRAST:  46mL GADAVIST GADOBUTROL 1 MMOL/ML IV SOLN COMPARISON:  Prior CT from earlier the same day. FINDINGS: Brain: Multiple scattered enhancing lesions are seen throughout the brain, consistent with intracranial metastatic disease. These are seen as follows: - dominant and most prominent of these lesions is positioned at the parasagittal right parietal lobe in measures 2.0 x 1.5 x 1.8 cm (series 23, image 35). Associated susceptibility artifact compatible with hemorrhage and/or necrosis. Localized vasogenic edema without significant regional mass effect or midline shift. Several additional subcentimeter lesions are seen as follows: - 4 mm lesion at the subcortical anterior left frontal lobe (series 23, image 37) - punctate lesion at the deep white matter of the right frontal lobe (series 24, image 23). - additional possible 4 mm lesion at the subcortical anterior right frontal lobe (series 24, image 25). - 6 mm rim enhancing lesion at the mid left temporal lobe (series 25, image 21). - 4  mm lesion at the right occipital lobe (series 23, image 23). Approximate 6 lesions are seen in total. Underlying cerebral volume within normal limits. No significant cerebral white matter disease. Small remote lacunar infarcts noted at the thalami bilaterally. Additional small focus of encephalomalacia without associated enhancement at the peripheral left cerebellum consistent with a chronic ischemic infarct as well (series 15, image 7). 4 mm focus of restricted diffusion at the subcortical right occipital lobe consistent with a small acute ischemic infarct (series 9, image 33). No associated hemorrhage or mass effect. Additional subtle diffusion abnormality involving the cortical gray matter of the high left frontal lobe also consistent with an acute ischemic infarct (series 9, images 91, 89). w additional subtle 7 mm focus of diffusion abnormality involving the right occipital pole without associated enhancement also suspicious for a small acute ischemic infarct (series 9, image 67). No other evidence for acute or subacute ischemia. Gray-white matter differentiation otherwise maintained. No midline shift or hydrocephalus. No extra-axial fluid collection. Pituitary gland suprasellar region within normal limits. Midline structures intact. Vascular: Major intracranial vascular flow voids are maintained. Skull and upper cervical spine: Craniocervical junction within normal limits. Metastatic lesion involving the left occipital condyle noted (series 13, image 15). No other definite  osseous lesions about the calvarium. Scalp soft tissues within normal limits. Sinuses/Orbits: Globes and orbital soft tissues within normal limits. Mild scattered mucosal thickening noted within the ethmoidal air cells. Paranasal sinuses are otherwise largely clear. No significant mastoid effusion. Inner ear structures within normal limits. Other: None. IMPRESSION: 1. Multiple scattered enhancing lesions throughout the brain, consistent with  intracranial metastatic disease. The dominant lesion is positioned at the right parietal lobe and measures up to 2 cm. Localized vasogenic edema without significant regional mass effect about this lesion. Remainder of the lesions are subcentimeter in size. 2. Few scattered subcentimeter acute ischemic nonhemorrhagic infarcts involving the left frontal lobe, right periatrial white matter, and right occipital lobe as above. 3. Osseous metastatic lesion involving the left occipital condyle. 4. 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 Findings communicated by telephone at the time of interpretation on 09/21/2020 at 11:55 p.m. to provider Arlean Hopping, who verbally acknowledged these results. Electronically Signed   By: Jeannine Boga M.D.   On: 09/22/2020 00:24   MR CERVICAL SPINE W WO CONTRAST  Result Date: 09/22/2020 CLINICAL DATA:  Initial evaluation for lower extremity weakness. EXAM: MRI CERVICAL SPINE WITHOUT AND WITH CONTRAST TECHNIQUE: Multiplanar and multiecho pulse sequences of the cervical spine, to include the craniocervical junction and cervicothoracic junction, were obtained without and with intravenous contrast. CONTRAST:  69mL GADAVIST GADOBUTROL 1 MMOL/ML IV SOLN COMPARISON:  None available. FINDINGS: Alignment: Straightening of the normal cervical lordosis. Trace anterolisthesis of C7 on T1, chronic and facet mediated. Vertebrae: Osseous metastatic implant seen involving the left occipital condyle. Additional osseous metastasis involves the posterior aspect of the C5 vertebral body. Metastatic lesion largely replaces the C6 vertebral body. Additional metastases seen involving the right posterior elements of C7 and T1. Additional signal abnormality involving the anterior aspect of C4 could reflect an additional lesion versus reactive degenerative change. No associated pathologic fracture. No significant extra osseous extension of tumor in the cervical  spine. Cord: Normal signal and morphology. No intramedullary or epidural tumor. No abnormal enhancement. Posterior Fossa, vertebral arteries, paraspinal tissues: 9 mm metastatic soft tissue implant seen involving the subcutaneous fat along the midline of the posterior cervical spine (series 36, image 5). Additional 1.5 cm soft tissue implant involves the right posterior paraspinous musculature at the level of C7 (series 38, image 33). Smaller 1 cm soft tissue implant involving the right posterior paraspinous musculature at C5-6 (series 38, image 25). Abnormal right supraclavicular adenopathy consistent with nodal metastatic disease, with the dominant node measuring 2.9 x 1.8 cm (series 38, image 33). Disc levels: C2-C3: Shallow central disc protrusion minimally indents the ventral thecal sac. Mild left-sided facet hypertrophy. No spinal stenosis. Foramina remain patent. C3-C4: Central disc osteophyte complex indents the ventral thecal sac, partially effacing the ventral CSF. Moderate left-sided facet hypertrophy with bilateral uncovertebral spurring. Resultant mild spinal stenosis without significant cord deformity. Severe left with moderate right C4 foraminal narrowing. C4-C5: Small central disc protrusion indents the ventral thecal sac. No significant stenosis or cord deformity. Superimposed mild left sided facet hypertrophy with uncovertebral spurring. Resultant moderate left worse than right C5 foraminal stenosis. C5-C6: Disc desiccation without disc bulge. Mild uncovertebral hypertrophy on the right. No spinal stenosis. Foramina remain patent. C6-C7: Mild disc bulge with right greater than left uncovertebral spurring. No spinal stenosis. Severe right with moderate left C7 foraminal narrowing. C7-T1: Trace anterolisthesis. Minimal disc bulge with bilateral uncovertebral hypertrophy. Moderate left with mild right facet degeneration. No significant spinal stenosis.  Moderate right C8 foraminal stenosis. No  significant left foraminal encroachment. IMPRESSION: 1. 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 No associated pathologic fracture or extraosseous extension. 2. 9 mm metastatic soft tissue implant involving the subcutaneous fat along the midline of the posterior cervical spine. Additional 1 cm in 1.5 cm soft tissue implants involving the right posterior paraspinous musculature at the levels of C5-6 and C7. 3. Abnormal right supraclavicular adenopathy, consistent with nodal metastatic disease. 4. Multilevel cervical spondylosis with resultant mild spinal stenosis at C3-4. Moderate to severe bilateral C4, C5, C7, and right C8 foraminal stenosis as above. Findings communicated by telephone at the time of interpretation on 09/21/2020 at 11:55 p.m. to provider Arlean Hopping, who verbally acknowledged these results. Electronically Signed   By: Jeannine Boga M.D.   On: 09/22/2020 00:42   MR THORACIC SPINE W WO CONTRAST  Result Date: 09/22/2020 CLINICAL DATA:  Initial evaluation for acute lower extremity weakness. EXAM: MRI THORACIC WITHOUT AND WITH CONTRAST TECHNIQUE: Multiplanar and multiecho pulse sequences of the thoracic spine were obtained without and with intravenous contrast. CONTRAST:  20mL GADAVIST GADOBUTROL 1 MMOL/ML IV SOLN COMPARISON:  None available. FINDINGS: MRI THORACIC SPINE FINDINGS Alignment: Mild dextroscoliosis. Alignment otherwise normal with preservation of the normal thoracic kyphosis. No listhesis. Vertebrae: Widespread osseous metastatic disease seen throughout the thoracic spine, with prominent lesion seen involving the posterior elements of T3, as well as the T5, T6, T7, T8, T9, and T12 vertebral bodies. Involvement is greatest at the level of T7 where there is an associated pathologic fracture. Associated height loss of up to  approximate 50%. Associated extraosseous extension with enhancing tumor seen extending into the ventral and right epidural space (series 42, image 38). Slight cephalad extension of epidural tumor to the levels of T6 and T8 inferiorly. Superimposed prominence of the dorsal epidural fat. Associated severe spinal stenosis with compression/impingement of the thoracic spinal cord at this level. No appreciable cord signal changes at this time. Tumor also extends into the bilateral T6-7 and T7-8 neural foramina. Extension into the right anterolateral paraspinous soft tissues also noted at the level of T7 (series 39, image 1). There is additional epidural extension of tumor into the ventral epidural space at the level of T9 with no more than mild spinal stenosis (series 42, image 50). No other significant epidural tumor. Extraosseous extension of tumor also noted about the T3 spinous process metastasis (series 39, image 8). Abnormal enhancement within the inter spinous region at T7-8 also consistent with extraosseous tumor (series 39, image 8). Cord: Ventral epidural tumor at the levels of T7 and T9 as above. Associated cord impingement without cord signal changes at the level of T7. Signal intensity within the thoracic spinal cord is otherwise normal. Paraspinal and other soft tissues: Paraspinous tumor centered about the level of T7. Additional extraosseous tumor about the spinous processes of T3 and T7. Large right perihilar mass and/or adenopathy partially visualized within the lungs (series 42, image 26). Extensive mediastinal and right supraclavicular adenopathy. Additional scattered bilateral pulmonary nodules concerning for metastatic disease. Few scattered lesions within the liver measuring up to approximately 2 cm concerning for intrahepatic metastases. Disc levels: No significant underlying degenerative disc disease within the thoracic spine. No other high-grade stenosis or impingement. IMPRESSION: 1. Widespread  osseous metastatic disease throughout the thoracic spine as detailed above. 2. Associated pathologic  fracture of T7 with up to 50% height loss. Extra osseous extension with epidural tumor at this level results in severe spinal stenosis and compression/impingement of the thoracic spinal cord. No cord signal changes at this time. 3. Additional mild epidural extension of tumor into the ventral epidural space at the level of T9 with no more than mild spinal stenosis. 4. Large right perihilar mass and/or adenopathy, raising the possibility for a primary lung malignancy. Additional extensive mediastinal and supraclavicular adenopathy with widespread pulmonary nodules concerning for metastatic disease. Further evaluation with dedicated cross-sectional imaging of the chest recommended. 5. Few scattered lesions within the liver, concerning for intrahepatic metastases. Findings communicated by telephone at the time of interpretation on 09/21/2020 at 11:55 p.m. to provider Arlean Hopping, who verbally acknowledged these results. Electronically Signed   By: Jeannine Boga M.D.   On: 09/22/2020 01:02   MR Lumbar Spine W Wo Contrast  Result Date: 09/22/2020 CLINICAL DATA:  Initial evaluation for acute lower extremity weakness. EXAM: MRI LUMBAR SPINE WITHOUT AND WITH CONTRAST TECHNIQUE: Multiplanar and multiecho pulse sequences of the lumbar spine were obtained without and with intravenous contrast. CONTRAST:  28mL GADAVIST GADOBUTROL 1 MMOL/ML IV SOLN COMPARISON:  None available. FINDINGS: Segmentation:  Standard. Alignment:  Physiologic. Vertebrae: Scattered osseous metastatic disease seen throughout the lumbar spine and sacrum. Most prominent involvement seen at the level of L3 which is largely replaced by a metastatic lesion. Associated pathologic fracture with up to 25% height loss without bony retropulsion. Additional metastatic lesion involves the left transverse process of L2 (series 11, image 14). Few additional  lesions noted about the sacrum. No significant extra osseous extension or epidural tumor. Conus medullaris and cauda equina: Conus extends to the T12-L1 level. Conus and cauda equina appear normal. Paraspinal and other soft tissues: 4.6 cm metastatic soft tissue implants seen within the left posterior paraspinous musculature at the level of L4 (series 32, image 14). Few additional subcentimeter nodular densities about the retroperitoneal fat suspicious for small metastases as well (series 35, image 5 on the left, image 14 on the right). Visualized visceral structures otherwise unremarkable. Disc levels: L1-2: Normal interspace. Moderate left with mild right facet hypertrophy. No stenosis. L2-3: Mild disc bulge with disc desiccation. Moderate right worse than left facet hypertrophy. No significant spinal stenosis. Mild right foraminal narrowing. L3-4: Mild diffuse disc bulge with disc desiccation. Superimposed right foraminal to extraforaminal disc protrusion contacts the exiting right L3 nerve root (series 13, image 23). Severe right with moderate left facet hypertrophy. Resultant mild narrowing of the lateral recesses bilaterally. Mild bilateral L3 foraminal stenosis. L4-5: Mild disc bulge. Superimposed shallow right foraminal to extraforaminal disc protrusion with annular fissure, contacting the exiting right L4 nerve root (series 13, image 29). Moderate left worse than right facet hypertrophy. Resultant mild canal with bilateral subarticular stenosis. Mild bilateral L4 foraminal narrowing. L5-S1: Mild disc bulge. Associated annular fissure at the level of the right neural foramen. Moderate right with mild left facet hypertrophy. No significant spinal stenosis. Mild right foraminal narrowing. IMPRESSION: 1. Scattered osseous metastatic disease throughout the lumbar spine and sacrum as above. Associated pathologic fracture at the level of L3 with up to 25% height loss without bony retropulsion. No epidural or  intracanalicular tumor. 2. 4.6 cm metastatic soft tissue implants within the left posterior paraspinous musculature at the level of L4. 3. Right foraminal to extraforaminal disc protrusions at L3-4 and L4-5, contacting and potentially irritating the exiting right L3 and L4 nerve roots respectively. 4. Underlying  moderate to advanced multilevel facet hypertrophy as detailed above. Electronically Signed   By: Jeannine Boga M.D.   On: 09/22/2020 01:12   NM GI Blood Loss  Result Date: 09/29/2020 CLINICAL DATA:  Lower GI bleeding.  Decreased hemoglobin EXAM: NUCLEAR MEDICINE GASTROINTESTINAL BLEEDING SCAN TECHNIQUE: Sequential abdominal images were obtained following intravenous administration of Tc-33m labeled red blood cells. Imaging was performed for 2 hours. RADIOPHARMACEUTICALS:  22.6 mCi Tc-68m pertechnetate in-vitro labeled red cells. COMPARISON:  None. FINDINGS: Normal blood pool activity is seen. No radiotracer accumulation in small or large bowel seen. IMPRESSION: No evidence of active GI bleeding. Electronically Signed   By: Marlaine Hind M.D.   On: 09/29/2020 16:44   MR SACRUM SI JOINTS W WO CONTRAST  Result Date: 09/22/2020 CLINICAL DATA:  Initial evaluation for acute lower extremity weakness. EXAM: MRI SACRUM WITHOUT AND WITH CONTRAST TECHNIQUE: Multiplanar multi-sequence MR imaging of the sacrum was performed with and without IV contrast. 9 cc of Gadavist was administered. COMPARISON:  None available. FINDINGS: Urinary Tract: Partially distended bladder moderately distended without abnormality. Bowel: Visualized bowel within normal limits without evidence for obstruction or inflammation. Vascular/Lymphatic: Normal vascular flow void seen throughout the visualized pelvis. No appreciable adenopathy. Reproductive:  Unremarkable. Other: Metastatic soft tissue implant measuring approximately 1.8 cm partially visualize within the right gluteal musculature (series 40, image 14). Additional 2 cm  implant seen more superiorly within the right gluteal musculature (series 40, image 4). Musculoskeletal: Dominant metastases seen involving the right sacrum at the level of the S2 segment. Lesion measures approximately 3.5 x 4.3 x 4.1 cm (series 40, image 14). Early extraosseous extension of tumor into the adjacent right S2 foramen without frank neural impingement (series 41, image 13). Additional 2.2 cm metastasis noted more superiorly and anteriorly at the right sacral ala (series 40, image 16). 4.4 cm osseous metastasis involves the right iliac wing (series 40, image 18). No associated pathologic fracture. No other significant extra osseous extension of tumor. IMPRESSION: 1. 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 Early extraosseous extension into the adjacent right S2 foramen without frank neural impingement. 2. Additional osseous metastases involving the right sacral ala and right iliac wing as above. No pathologic fracture or other complication. 3. Metastatic soft tissue implants within the right gluteal musculature as above. Electronically Signed   By: Jeannine Boga M.D.   On: 09/22/2020 01:19   CT CHEST ABDOMEN PELVIS W CONTRAST  Result Date: 09/22/2020 CLINICAL DATA:  Evaluate for neoplasm. EXAM: CT CHEST, ABDOMEN, AND PELVIS WITH CONTRAST TECHNIQUE: Multidetector CT imaging of the chest, abdomen and pelvis was performed following the standard protocol during bolus administration of intravenous contrast. CONTRAST:  47mL OMNIPAQUE IOHEXOL 300 MG/ML  SOLN COMPARISON:  None FINDINGS: CT CHEST FINDINGS Cardiovascular: The heart size appears normal. No pericardial effusion identified. Aortic atherosclerosis. Mediastinum/Nodes: Normal appearance of the thyroid gland. The trachea appears patent and is midline. Normal appearance of the esophagus. Extensive thoracic adenopathy identified compatible with nodal metastases. Right supraclavicular lymph node  measures 1.6 cm, image 5/3 Nodal mass within the right paratracheal region measures 7.3 x 5.2 by 6.8 cm, image 16/3. This encases and narrows the SVC and left innominate vein. Pre-vascular lymph node anterior to the ascending aorta measures 2.7 cm, image 22/3. Subcarinal lymph node measures 2.7 cm, image 29/3. Right perihilar nodal mass measures 3.0 by 3.5 by 4.2 cm, image 27/3 and image 78/5. Left AP window lymph node measures 1.3 cm, image  21/3. Prominent left hilar lymph nodes are also noted which measure up to 1.1 cm. Lungs/Pleura: Centrilobular and paraseptal emphysema. No pleural effusion identified. Within the posteromedial right upper lobe there is a lung mass which is contiguous with the right hilum. This measures 3.8 x 2.8 by 3.6 cm, image 50/4 and image 92/5. Innumerable lung nodules are scattered throughout both lungs compatible with diffuse metastases. Index nodule in the superior segment of left lower lobe measures 1.1 cm, image 62/4 Subpleural nodule in the posteromedial right lower lobe measures 1.4 cm, image 80/4. Central left upper lobe lung nodule measures 0.8 cm, image 65/4. Musculoskeletal: Scattered lucent bone lesions are identified within the bony thorax. These are better seen on the MRI from 09/21/2020. Pathologic compression fracture noted at T7 with loss of 50% of the vertebral body height. Again seen are signs of extra osseous extension with epidural tumor at this level. CT ABDOMEN PELVIS FINDINGS Hepatobiliary: Diffuse liver metastases identified. Index lesion within the subcapsular aspect of segment 7 measures 2.1 cm, image 44/3. Index lesion within segment 4 B measures 1.8 cm, image 47/3. Caudate lobe lesion measures 1.8 cm, image 50/3. Gallbladder is unremarkable.  No biliary ductal dilatation. Pancreas: Unremarkable. No pancreatic ductal dilatation or surrounding inflammatory changes. Spleen: Normal in size without focal abnormality. Adrenals/Urinary Tract: Normal appearance of the  adrenal glands. Mild bilateral renal cortical volume loss. No suspicious mass or hydronephrosis identified. Urinary bladder is unremarkable. Stomach/Bowel: There is moderate distension of the stomach. No bowel wall thickening, inflammation, or distension. Vascular/Lymphatic: Aortic atherosclerosis. No aneurysm. No abdominopelvic adenopathy. Reproductive: Prostate is unremarkable. Other: No ascites or focal fluid collections. Signs of peritoneal metastases identified. Index lesion adjacent to the transverse colon within the left upper quadrant measures 2.5 cm, image 53/3. Musculoskeletal: Osseous metastases within the lumbar spine and sacrum are better demonstrated on recent MRI from 07/22/2020. The pathologic fracture involving the L3 vertebral body is again noted and appears unchanged, image 88/6. the left paraspinous intra muscular metastasis is noted measuring 4 cm, image 92/3. IMPRESSION: 1. Right upper lobe lung mass is identified which is contiguous with the right hilum and is concerning for primary bronchogenic carcinoma. 2. Bilateral pulmonary nodularity compatible with metastatic disease. 3. Extensive thoracic and abdominal adenopathy compatible with nodal metastases. There is encasement and narrowing of the superior vena cava and left innominate vein. 4. Diffuse liver metastases. 5. Signs of peritoneal metastases. 6. Osseous metastases within the bony thorax, lumbar spine and sacrum are better demonstrated on the recent MRI from 09/21/2020. Pathologic fractures are noted involving the T7 and L3 vertebra. 7. 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 8. Emphysema and aortic atherosclerosis. Aortic Atherosclerosis (ICD10-I70.0) and Emphysema (ICD10-J43.9). Electronically Signed   By: Kerby Moors M.D.   On: 09/22/2020 09:30   ECHOCARDIOGRAM COMPLETE  Result Date: 09/22/2020    ECHOCARDIOGRAM REPORT   Patient Name:   ORMAN Donoghue Date of Exam: 09/22/2020 Medical Rec #:   [id number]     Height:       70.0 in Accession #:    [id number]    Weight:       200.0 lb Date of Birth:  July 17, 1961    BSA:          2.087 m Patient Age:    9 years      BP:           113/72 mmHg Patient Gender: M  HR:           68 bpm. Exam Location:  Inpatient Procedure: 2D Echo Indications:    stroke 434.91  History:        Patient has no prior history of Echocardiogram examinations.                 Risk Factors:Current Smoker.  Sonographer:    Jannett Celestine RDCS (AE) Referring Phys: [id number] TIMOTHY S OPYD  Sonographer Comments: Image acquisition challenging due to respiratory motion. see comments regarding exam. off axis windows IMPRESSIONS  1. Left ventricular ejection fraction, by estimation, is 60 to 65%. The left ventricle has normal function. The left ventricle has no regional wall motion abnormalities. There is mild left ventricular hypertrophy. Left ventricular diastolic parameters were normal.  2. Right ventricular systolic function is normal. The right ventricular size is normal.  3. The mitral valve is normal in structure. No evidence of mitral valve regurgitation. No evidence of mitral stenosis.  4. The aortic valve has an indeterminant number of cusps. Aortic valve regurgitation is not visualized. No aortic stenosis is present.  5. The inferior vena cava is normal in size with greater than 50% respiratory variability, suggesting right atrial pressure of 3 mmHg. FINDINGS  Left Ventricle: Left ventricular ejection fraction, by estimation, is 60 to 65%. The left ventricle has normal function. The left ventricle has no regional wall motion abnormalities. The left ventricular internal cavity size was normal in size. There is  mild left ventricular hypertrophy. Left ventricular diastolic parameters were normal. Right Ventricle: The right ventricular size is normal. No increase in right ventricular wall thickness. Right ventricular systolic function is normal. Left Atrium: Left atrial size was  normal in size. Right Atrium: Right atrial size was normal in size. Pericardium: There is no evidence of pericardial effusion. Mitral Valve: The mitral valve is normal in structure. No evidence of mitral valve regurgitation. No evidence of mitral valve stenosis. Tricuspid Valve: The tricuspid valve is normal in structure. Tricuspid valve regurgitation is not demonstrated. No evidence of tricuspid stenosis. Aortic Valve: The aortic valve has an indeterminant number of cusps. Aortic valve regurgitation is not visualized. No aortic stenosis is present. Aortic valve mean gradient measures 3.4 mmHg. Aortic valve peak gradient measures 6.9 mmHg. Aortic valve area, by VTI measures 4.15 cm. Pulmonic Valve: The pulmonic valve was not well visualized. Pulmonic valve regurgitation is not visualized. No evidence of pulmonic stenosis. Aorta: The aortic root is normal in size and structure. Pulmonary Artery: Indeterminant PASP, inadequate TR jet. Venous: The inferior vena cava was not well visualized. The inferior vena cava is normal in size with greater than 50% respiratory variability, suggesting right atrial pressure of 3 mmHg. IAS/Shunts: The interatrial septum was not well visualized.  LEFT VENTRICLE PLAX 2D LVIDd:         3.80 cm  Diastology LVIDs:         2.40 cm  LV e' medial:    9.68 cm/s LV PW:         1.10 cm  LV E/e' medial:  6.6 LV IVS:        1.20 cm  LV e' lateral:   11.90 cm/s LVOT diam:     2.20 cm  LV E/e' lateral: 5.4 LV SV:         81 LV SV Index:   39 LVOT Area:     3.80 cm  RIGHT VENTRICLE RV S prime:     14.60 cm/s TAPSE (M-mode):  2.2 cm LEFT ATRIUM         Index LA diam:    3.60 cm 1.72 cm/m  AORTIC VALVE AV Area (Vmax):    3.70 cm AV Area (Vmean):   3.44 cm AV Area (VTI):     4.15 cm AV Vmax:           131.67 cm/s AV Vmean:          87.607 cm/s AV VTI:            0.194 m AV Peak Grad:      6.9 mmHg AV Mean Grad:      3.4 mmHg LVOT Vmax:         128.00 cm/s LVOT Vmean:        79.200 cm/s LVOT VTI:           0.212 m LVOT/AV VTI ratio: 1.09  AORTA Ao Root diam: 3.30 cm MITRAL VALVE MV Area (PHT): 3.46 cm    SHUNTS MV Decel Time: 219 msec    Systemic VTI:  0.21 m MV E velocity: 64.30 cm/s  Systemic Diam: 2.20 cm MV A velocity: 68.60 cm/s MV E/A ratio:  0.94 Carlyle Dolly MD Electronically signed by Carlyle Dolly MD Signature Date/Time: 09/22/2020/1:41:07 PM    Final    [place] CORE BIOPSY (LYMPH NODES)  Result Date: 09/23/2020 INDICATION: No known primary, now with concern for metastatic lung cancer. Please perform ultrasound-guided biopsy right supraclavicular lymph node for tissue diagnostic purposes. EXAM: ULTRASOUND-GUIDED RIGHT SUPRACLAVICULAR LYMPH NODE BIOPSY COMPARISON:  CT the chest, abdomen and pelvis-09/22/2020 MEDICATIONS: None ANESTHESIA/SEDATION: Moderate (conscious) sedation was employed during this procedure. A total of Versed 1.5 mg and Fentanyl 50 mcg was administered intravenously. Moderate Sedation Time: 10 minutes. The patient's level of consciousness and vital signs were monitored continuously by radiology nursing throughout the procedure under my direct supervision. COMPLICATIONS: None immediate. TECHNIQUE: Informed written consent was obtained from the patient after a discussion of the risks, benefits and alternatives to treatment. Questions regarding the procedure were encouraged and answered. Initial ultrasound scanning demonstrated an approximately 2.4 x 1.6 cm right supraclavicular lymph node (image 2) correlating with the dominant right supraclavicular lymph node seen on preceding chest CT image 5, series 3. An ultrasound image was saved for documentation purposes. The procedure was planned. A timeout was performed prior to the initiation of the procedure. The operative was prepped and draped in the usual sterile fashion, and a sterile drape was applied covering the operative field. A timeout was performed prior to the initiation of the procedure. Local anesthesia was provided with 1%  lidocaine with epinephrine. Under direct ultrasound guidance, an 18 gauge core needle device was utilized to obtain to obtain 6 core needle biopsies of the right supraclavicular lymph node. The samples were placed in saline and submitted to pathology. The needle was removed and hemostasis was achieved with manual compression. Post procedure scan was negative for significant hematoma. A dressing was placed. The patient tolerated the procedure well without immediate postprocedural complication. IMPRESSION: Technically successful ultrasound guided biopsy of right supraclavicular lymph node. Electronically Signed   By: Sandi Mariscal M.D.   On: 09/23/2020 15:54   VAS US CAROTID (at [facility] and [facility] only)  Result Date: 09/23/2020 Carotid Arterial Duplex Study Indications:       CVA and Back pain, difficulty walking. Risk Factors:      Current smoker. 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 Comparison Study:  No prior study Performing Technologist: Sharion Dove RVS  Examination Guidelines: A complete evaluation includes B-mode imaging, spectral Doppler, color Doppler, and power Doppler as needed of all accessible portions of each vessel. Bilateral testing is considered an integral part of a complete examination. Limited examinations for reoccurring indications may be performed as noted.  Right Carotid Findings: +----------+--------+--------+--------+------------------+------------------+           PSV cm/sEDV cm/sStenosisPlaque DescriptionComments           +----------+--------+--------+--------+------------------+------------------+ CCA Prox  105     26                                intimal thickening +----------+--------+--------+--------+------------------+------------------+ CCA Distal106     36                                intimal thickening +----------+--------+--------+--------+------------------+------------------+ ICA Prox   82      27              heterogenous                         +----------+--------+--------+--------+------------------+------------------+ ICA Distal95      34                                                   +----------+--------+--------+--------+------------------+------------------+ ECA       157     31                                                   +----------+--------+--------+--------+------------------+------------------+ +----------+--------+-------+--------+-------------------+           PSV cm/sEDV cmsDescribeArm Pressure (mmHG) +----------+--------+-------+--------+-------------------+ [id number]                                        +----------+--------+-------+--------+-------------------+ +---------+--------+--+--------+--+ VertebralPSV cm/s27EDV cm/s16 +---------+--------+--+--------+--+  Left Carotid Findings: +----------+--------+--------+--------+------------------+------------------+           PSV cm/sEDV cm/sStenosisPlaque DescriptionComments           +----------+--------+--------+--------+------------------+------------------+ CCA Prox  104     34                                intimal thickening +----------+--------+--------+--------+------------------+------------------+ CCA Distal116     37                                intimal thickening +----------+--------+--------+--------+------------------+------------------+ ICA Prox  105     38              heterogenous                         +----------+--------+--------+--------+------------------+------------------+ ICA Distal105     29                                                   +----------+--------+--------+--------+------------------+------------------+  ECA       121     21                                                   +----------+--------+--------+--------+------------------+------------------+  +----------+--------+--------+--------+-------------------+           PSV cm/sEDV cm/sDescribeArm Pressure (mmHG) +----------+--------+--------+--------+-------------------+ [id number]                                          +----------+--------+--------+--------+-------------------+ +---------+--------+--+--------+--+ VertebralPSV cm/s52EDV cm/s19 +---------+--------+--+--------+--+   Summary: Right Carotid: The extracranial vessels were near-normal with only minimal wall                thickening or plaque. Left Carotid: The extracranial vessels were near-normal with only minimal wall               thickening or plaque. Vertebrals:  Bilateral vertebral arteries demonstrate antegrade flow. Subclavians: Normal flow hemodynamics were seen in bilateral subclavian              arteries. *See table(s) above for measurements and observations.  Electronically signed by Antony Contras MD on 09/23/2020 at 1:55:25 PM.    Final       Subjective: No acute issues overnight, excited to finally discharge   Discharge Exam: Vitals:   10/11/20 2124 10/12/20 0438  BP: 100/64 102/69  Pulse: 99 91  Resp: 20 18  Temp: 98 F (36.7 C) 98 F (36.7 C)  SpO2: 100% 99%   Vitals:   10/11/20 2114 10/11/20 2118 10/11/20 2124 10/12/20 0438  BP: (!) 98/59 111/68 100/64 102/69  Pulse: 100 (!) 108 99 91  Resp: 20 20 20 18   Temp: 97.9 F (36.6 C) 97.9 F (36.6 C) 98 F (36.7 C) 98 F (36.7 C)  TempSrc: Oral Oral Oral Oral  SpO2: 99% 98% 100% 99%  Weight:      Height:        General: Pt is alert, awake, not in acute distress Cardiovascular: RRR, S1/S2 +, no rubs, no gallops Respiratory: CTA bilaterally, no wheezing, no rhonchi Abdominal: Soft, NT, ND, bowel sounds + Extremities: no edema, no cyanosis    The results of significant diagnostics from this hospitalization (including imaging, microbiology, ancillary and laboratory) are listed below for reference.     Microbiology: Recent  Results (from the past 240 hour(s))  Resp Panel by RT-PCR (Flu A&B, Covid) Nasopharyngeal Swab     Status: None   Collection Time: 10/08/20  5:29 PM   Specimen: Nasopharyngeal Swab; Nasopharyngeal(NP) swabs in vial transport medium  Result Value Ref Range Status   SARS Coronavirus 2 by RT PCR NEGATIVE NEGATIVE Final    Comment: (NOTE) SARS-CoV-2 target nucleic acids are NOT DETECTED.  The SARS-CoV-2 RNA is generally detectable in upper respiratory specimens during the acute phase of infection. The lowest concentration of SARS-CoV-2 viral copies this assay can detect is 138 copies/mL. A negative result does not preclude SARS-Cov-2 infection and should not be used as the sole basis for treatment or other patient management decisions. A negative result may occur with  improper specimen collection/handling, submission of specimen other than nasopharyngeal swab, presence of viral mutation(s) within the areas targeted by this assay, and inadequate number of viral copies(<138 copies/mL). A  negative result must be combined with clinical observations, patient history, and epidemiological information. The expected result is Negative.  Fact Sheet for Patients:  EntrepreneurPulse.com.au  Fact Sheet for Healthcare Providers:  IncredibleEmployment.be  This test is no t yet approved or cleared by the [place] FDA and  has been authorized for detection and/or diagnosis of SARS-CoV-2 by FDA under an Emergency Use Authorization (EUA). This EUA will remain  in effect (meaning this test can be used) for the duration of the COVID-19 declaration under Section 564(b)(1) of the Act, 21 U.S.C.section 360bbb-3(b)(1), unless the authorization is terminated  or revoked sooner.       Influenza A by PCR NEGATIVE NEGATIVE Final   Influenza B by PCR NEGATIVE NEGATIVE Final    Comment: (NOTE) The Xpert Xpress SARS-CoV-2/FLU/RSV plus assay is intended as an aid in the  diagnosis of influenza from Nasopharyngeal swab specimens and should not be used as a sole basis for treatment. Nasal washings and aspirates are unacceptable for Xpert Xpress SARS-CoV-2/FLU/RSV testing.  Fact Sheet for Patients: EntrepreneurPulse.com.au  Fact Sheet for Healthcare Providers: IncredibleEmployment.be  This test is not yet approved or cleared by the [place] FDA and has been authorized for detection and/or diagnosis of SARS-CoV-2 by FDA under an Emergency Use Authorization (EUA). This EUA will remain in effect (meaning this test can be used) for the duration of the COVID-19 declaration under Section 564(b)(1) of the Act, 21 U.S.C. section 360bbb-3(b)(1), unless the authorization is terminated or revoked.  Performed at [facility], [place] [address] [place], [place] [address]      Labs: BNP (last 3 results) No results for input(s): BNP in the last 8760 hours. Basic Metabolic Panel: Recent Labs  Lab 10/07/20 0549 10/09/20 0710  NA 137 134*  K 4.9 4.7  CL 104 103  CO2 23 21*  GLUCOSE 117* 117*  BUN 50* 44*  CREATININE 1.47* 1.11  CALCIUM 8.0* 8.3*   Liver Function Tests: No results for input(s): AST, ALT, ALKPHOS, BILITOT, PROT, ALBUMIN in the last 168 hours. No results for input(s): LIPASE, AMYLASE in the last 168 hours. No results for input(s): AMMONIA in the last 168 hours. CBC: Recent Labs  Lab 10/07/20 0549 10/09/20 0710  WBC 14.5* 11.1*  NEUTROABS 13.0* 10.2*  HGB 8.2* 8.7*  HCT 25.1* 26.3*  MCV 98.8 98.1  PLT 212 215   Cardiac Enzymes: No results for input(s): CKTOTAL, CKMB, CKMBINDEX, TROPONINI in the last 168 hours. BNP: Invalid input(s): POCBNP CBG: No results for input(s): GLUCAP in the last 168 hours. D-Dimer No results for input(s): DDIMER in the last 72 hours. Hgb A1c No results for input(s): HGBA1C in the last 72 hours. Lipid Profile No results for input(s): CHOL,  HDL, LDLCALC, TRIG, CHOLHDL, LDLDIRECT in the last 72 hours. Thyroid function studies No results for input(s): TSH, T4TOTAL, T3FREE, THYROIDAB in the last 72 hours.  Invalid input(s): FREET3 Anemia work up No results for input(s): VITAMINB12, FOLATE, FERRITIN, TIBC, IRON, RETICCTPCT in the last 72 hours. Urinalysis    Component Value Date/Time   COLORURINE YELLOW 09/22/2020 1607   APPEARANCEUR CLEAR 09/22/2020 1607   LABSPEC 1.030 09/22/2020 1607   PHURINE 5.0 09/22/2020 1607   GLUCOSEU NEGATIVE 09/22/2020 1607   HGBUR SMALL (A) 09/22/2020 1607   BILIRUBINUR NEGATIVE 09/22/2020 1607   KETONESUR 5 (A) 09/22/2020 1607   PROTEINUR NEGATIVE 09/22/2020 1607   NITRITE NEGATIVE 09/22/2020 1607   LEUKOCYTESUR NEGATIVE 09/22/2020 1607   Sepsis Labs Invalid input(s): PROCALCITONIN,  WBC,  LACTICIDVEN Microbiology Recent Results (from the past 240 hour(s))  Resp Panel by RT-PCR (Flu A&B, Covid) Nasopharyngeal Swab     Status: None   Collection Time: 10/08/20  5:29 PM   Specimen: Nasopharyngeal Swab; Nasopharyngeal(NP) swabs in vial transport medium  Result Value Ref Range Status   SARS Coronavirus 2 by RT PCR NEGATIVE NEGATIVE Final    Comment: (NOTE) SARS-CoV-2 target nucleic acids are NOT DETECTED.  The SARS-CoV-2 RNA is generally detectable in upper respiratory specimens during the acute phase of infection. The lowest concentration of SARS-CoV-2 viral copies this assay can detect is 138 copies/mL. A negative result does not preclude SARS-Cov-2 infection and should not be used as the sole basis for treatment or other patient management decisions. A negative result may occur with  improper specimen collection/handling, submission of specimen other than nasopharyngeal swab, presence of viral mutation(s) within the areas targeted by this assay, and inadequate number of viral copies(<138 copies/mL). A negative result must be combined with clinical observations, patient history, and  epidemiological information. The expected result is Negative.  Fact Sheet for Patients:  EntrepreneurPulse.com.au  Fact Sheet for Healthcare Providers:  IncredibleEmployment.be  This test is no t yet approved or cleared by the [place] FDA and  has been authorized for detection and/or diagnosis of SARS-CoV-2 by FDA under an Emergency Use Authorization (EUA). This EUA will remain  in effect (meaning this test can be used) for the duration of the COVID-19 declaration under Section 564(b)(1) of the Act, 21 U.S.C.section 360bbb-3(b)(1), unless the authorization is terminated  or revoked sooner.       Influenza A by PCR NEGATIVE NEGATIVE Final   Influenza B by PCR NEGATIVE NEGATIVE Final    Comment: (NOTE) The Xpert Xpress SARS-CoV-2/FLU/RSV plus assay is intended as an aid in the diagnosis of influenza from Nasopharyngeal swab specimens and should not be used as a sole basis for treatment. Nasal washings and aspirates are unacceptable for Xpert Xpress SARS-CoV-2/FLU/RSV testing.  Fact Sheet for Patients: EntrepreneurPulse.com.au  Fact Sheet for Healthcare Providers: IncredibleEmployment.be  This test is not yet approved or cleared by the [place] FDA and has been authorized for detection and/or diagnosis of SARS-CoV-2 by FDA under an Emergency Use Authorization (EUA). This EUA will remain in effect (meaning this test can be used) for the duration of the COVID-19 declaration under Section 564(b)(1) of the Act, 21 U.S.C. section 360bbb-3(b)(1), unless the authorization is terminated or revoked.  Performed at [facility], [place] [address], [place], [place] [address]      Time coordinating discharge: Over 30 minutes  SIGNED:   Little Ishikawa, DO [facility] 10/12/2020, 8:04 AM Pager   If 7PM-7AM, please contact night-coverage [email or web address]

## 2020-10-14 ENCOUNTER — Ambulatory Visit: Payer: Self-pay

## 2020-10-15 ENCOUNTER — Ambulatory Visit: Payer: Self-pay

## 2020-10-22 DIAGNOSIS — K922 Gastrointestinal hemorrhage, unspecified: Secondary | ICD-10-CM

## 2020-11-16 DEATH — deceased

## 2020-12-15 NOTE — Progress Notes (Signed)
  Radiation Oncology         ([phone]) [phone] ________________________________  Name: Tyler Barker MRN: [id number]  Date: 10/09/2020  DOB: 30-Apr-1961  End of Treatment Note  Diagnosis:   Stage IV NSCLC     Indication for treatment::  palliative       Radiation treatment dates:   09/26/20 - 10/09/20  Site/dose:    1.  Whole brain XRT was delivered to a dose o 30 Gy in 10 fractions using a 2-field isodose plan, with reduced fields used to improve dose heterogeneity. 2.  The sacrum/ L-spine was treated to a dose of 30 Gy in 10 fractions using a 4-field 3D conformal technique. 3.  The chest/ T-spine was treated to a dose of 30 Gy in 10 fractions using a 2-field technique. This targeted 2 separate areas both with the chest and the spine.    Narrative: The patient tolerated radiation treatment. Extensive discussions with the patient initially were necessary to establish goals of care. The patient was reluctant initially to begin XRT, but subsequently agree to XRT to 4 major areas of disease. Unfortunately, the patient presented with widespread metastatic disease and the target lesions were prioritized. The plan was to deliver XRT to the initial most pressing areas of concern, which would be followed by chemo. We discussed additional XRT could be given subsequently to additional areas depending on his future clinical status. The patient ultimately completed XRT to the initial target lesions and then planned transfer to [facility] for further management.    Plan: The patient is being transferred to [facility]. No further followup planned here in [place]. ________________________________  Jodelle Gross, M.D., Ph.D.

## 2022-06-28 IMAGING — NM NM GI BLOOD LOSS
2 series · 12 of 12 positions shown · non-contrast
Comparison: None.

CLINICAL DATA: Lower GI bleeding.  Decreased hemoglobin

EXAM:
NUCLEAR MEDICINE GASTROINTESTINAL BLEEDING SCAN
TECHNIQUE: Sequential abdominal images were obtained following intravenous
administration of Dc-TTm labeled red blood cells. Imaging was
performed for 2 hours.
RADIOPHARMACEUTICALS:  22.6 mCi Dc-TTm pertechnetate in-vitro
labeled red cells.

[Series 1: gi bleed 2 · 3.28mm/px · 6 of 60 frames shown]
[frame 6/60]
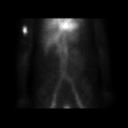
[frame 16/60]
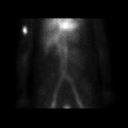
[frame 26/60]
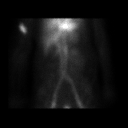
[frame 36/60]
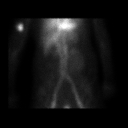
[frame 46/60]
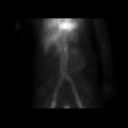
[frame 56/60]
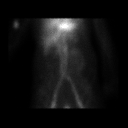

[Series 1: gi bleed · 3.28mm/px · 6 of 60 frames shown]
[frame 6/60]
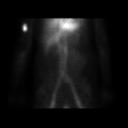
[frame 16/60]
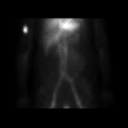
[frame 26/60]
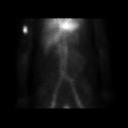
[frame 36/60]
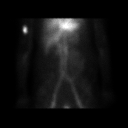
[frame 46/60]
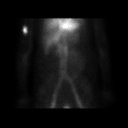
[frame 56/60]
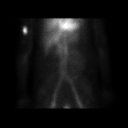

[12 of 12 positions shown; findings below may reference images not displayed]

FINDINGS: Normal blood pool activity is seen. No radiotracer accumulation in
small or large bowel seen.
IMPRESSION: No evidence of active GI bleeding.
